# Patient Record
Sex: Female | Born: 1975
Health system: Southern US, Community
[De-identification: ages and names within clinical notes are randomized; demographics above are authoritative.]

## PROBLEM LIST (undated history)

## (undated) DIAGNOSIS — K589 Irritable bowel syndrome without diarrhea: Secondary | ICD-10-CM

## (undated) DIAGNOSIS — R87619 Unspecified abnormal cytological findings in specimens from cervix uteri: Secondary | ICD-10-CM

## (undated) DIAGNOSIS — R51 Headache: Secondary | ICD-10-CM

## (undated) DIAGNOSIS — R892 Abnormal level of other drugs, medicaments and biological substances in specimens from other organs, systems and tissues: Secondary | ICD-10-CM

## (undated) DIAGNOSIS — F3181 Bipolar II disorder: Secondary | ICD-10-CM

## (undated) DIAGNOSIS — Z8619 Personal history of other infectious and parasitic diseases: Secondary | ICD-10-CM

## (undated) DIAGNOSIS — IMO0002 Reserved for concepts with insufficient information to code with codable children: Secondary | ICD-10-CM

## (undated) DIAGNOSIS — R519 Headache, unspecified: Secondary | ICD-10-CM

## (undated) DIAGNOSIS — E039 Hypothyroidism, unspecified: Secondary | ICD-10-CM

## (undated) DIAGNOSIS — Z8701 Personal history of pneumonia (recurrent): Secondary | ICD-10-CM

## (undated) DIAGNOSIS — F410 Panic disorder [episodic paroxysmal anxiety] without agoraphobia: Secondary | ICD-10-CM

## (undated) DIAGNOSIS — Z8742 Personal history of other diseases of the female genital tract: Secondary | ICD-10-CM

## (undated) HISTORY — DX: Abnormal level of other drugs, medicaments and biological substances in specimens from other organs, systems and tissues: R89.2

## (undated) HISTORY — DX: Bipolar II disorder: F31.81

## (undated) HISTORY — DX: Hypothyroidism, unspecified: E03.9

## (undated) HISTORY — DX: Headache, unspecified: R51.9

## (undated) HISTORY — DX: Personal history of pneumonia (recurrent): Z87.01

## (undated) HISTORY — DX: Unspecified abnormal cytological findings in specimens from cervix uteri: R87.619

## (undated) HISTORY — DX: Panic disorder (episodic paroxysmal anxiety): F41.0

## (undated) HISTORY — DX: Personal history of other diseases of the female genital tract: Z87.42

## (undated) HISTORY — DX: Personal history of other infectious and parasitic diseases: Z86.19

## (undated) HISTORY — DX: Headache: R51

## (undated) HISTORY — PX: LAPAROSCOPIC ENDOMETRIOSIS FULGURATION: SUR769

## (undated) HISTORY — PX: KNEE ARTHROSCOPY WITH ANTERIOR CRUCIATE LIGAMENT (ACL) REPAIR: SHX5644

## (undated) HISTORY — PX: ABDOMINAL HYSTERECTOMY: SHX81

## (undated) HISTORY — DX: Reserved for concepts with insufficient information to code with codable children: IMO0002

## (undated) HISTORY — DX: Irritable bowel syndrome, unspecified: K58.9

---

## 1998-07-09 ENCOUNTER — Inpatient Hospital Stay (HOSPITAL_COMMUNITY): Admission: AD | Admit: 1998-07-09 | Discharge: 1998-07-09 | Payer: Self-pay | Admitting: Obstetrics

## 1998-07-26 ENCOUNTER — Inpatient Hospital Stay (HOSPITAL_COMMUNITY): Admission: AD | Admit: 1998-07-26 | Discharge: 1998-07-26 | Payer: Self-pay | Admitting: *Deleted

## 1998-08-22 ENCOUNTER — Inpatient Hospital Stay (HOSPITAL_COMMUNITY): Admission: AD | Admit: 1998-08-22 | Discharge: 1998-08-22 | Payer: Self-pay | Admitting: *Deleted

## 1998-08-22 ENCOUNTER — Encounter: Payer: Self-pay | Admitting: *Deleted

## 2004-04-30 ENCOUNTER — Ambulatory Visit (HOSPITAL_COMMUNITY): Admission: RE | Admit: 2004-04-30 | Discharge: 2004-04-30 | Payer: Self-pay | Admitting: Gynecology

## 2004-04-30 ENCOUNTER — Encounter (INDEPENDENT_AMBULATORY_CARE_PROVIDER_SITE_OTHER): Payer: Self-pay | Admitting: Specialist

## 2004-09-28 ENCOUNTER — Ambulatory Visit: Payer: Self-pay | Admitting: Chiropractic Medicine

## 2005-11-07 ENCOUNTER — Emergency Department: Payer: Self-pay | Admitting: Emergency Medicine

## 2006-10-01 ENCOUNTER — Encounter (INDEPENDENT_AMBULATORY_CARE_PROVIDER_SITE_OTHER): Payer: Self-pay | Admitting: *Deleted

## 2006-10-01 ENCOUNTER — Ambulatory Visit: Payer: Self-pay | Admitting: Obstetrics & Gynecology

## 2007-10-07 ENCOUNTER — Ambulatory Visit: Payer: Self-pay | Admitting: Gynecology

## 2007-10-07 ENCOUNTER — Encounter (INDEPENDENT_AMBULATORY_CARE_PROVIDER_SITE_OTHER): Payer: Self-pay | Admitting: Gynecology

## 2008-02-17 ENCOUNTER — Ambulatory Visit: Payer: Self-pay | Admitting: Gynecology

## 2008-02-26 ENCOUNTER — Ambulatory Visit (HOSPITAL_COMMUNITY): Admission: RE | Admit: 2008-02-26 | Discharge: 2008-02-26 | Payer: Self-pay | Admitting: Gynecology

## 2008-03-08 ENCOUNTER — Ambulatory Visit (HOSPITAL_COMMUNITY): Admission: RE | Admit: 2008-03-08 | Discharge: 2008-03-08 | Payer: Self-pay | Admitting: Gynecology

## 2008-03-08 ENCOUNTER — Ambulatory Visit: Payer: Self-pay | Admitting: Gynecology

## 2008-03-24 ENCOUNTER — Ambulatory Visit: Payer: Self-pay | Admitting: Gynecology

## 2008-03-29 ENCOUNTER — Ambulatory Visit: Payer: Self-pay | Admitting: Gynecology

## 2008-06-28 ENCOUNTER — Ambulatory Visit: Payer: Self-pay | Admitting: Obstetrics and Gynecology

## 2009-01-09 ENCOUNTER — Encounter: Payer: Self-pay | Admitting: Obstetrics & Gynecology

## 2009-01-09 ENCOUNTER — Ambulatory Visit: Payer: Self-pay | Admitting: Obstetrics & Gynecology

## 2009-04-06 ENCOUNTER — Ambulatory Visit: Payer: Self-pay | Admitting: Obstetrics & Gynecology

## 2009-06-01 ENCOUNTER — Ambulatory Visit: Payer: Self-pay | Admitting: Family Medicine

## 2009-07-28 ENCOUNTER — Encounter: Payer: Self-pay | Admitting: Obstetrics & Gynecology

## 2009-08-01 ENCOUNTER — Ambulatory Visit: Payer: Self-pay | Admitting: Obstetrics & Gynecology

## 2009-10-30 ENCOUNTER — Ambulatory Visit: Payer: Self-pay | Admitting: Obstetrics & Gynecology

## 2010-02-04 ENCOUNTER — Ambulatory Visit: Payer: Self-pay | Admitting: Family Medicine

## 2010-02-04 DIAGNOSIS — R319 Hematuria, unspecified: Secondary | ICD-10-CM | POA: Insufficient documentation

## 2010-02-04 LAB — CONVERTED CEMR LAB
Glucose, Urine, Semiquant: 250
Nitrite: POSITIVE
Protein, U semiquant: 100
Specific Gravity, Urine: 1.02
Urobilinogen, UA: >=8
pH: 5

## 2010-02-05 ENCOUNTER — Encounter (INDEPENDENT_AMBULATORY_CARE_PROVIDER_SITE_OTHER): Payer: Self-pay | Admitting: Family Medicine

## 2010-02-07 ENCOUNTER — Encounter: Payer: Self-pay | Admitting: Family Medicine

## 2010-02-07 ENCOUNTER — Ambulatory Visit: Payer: Self-pay | Admitting: Obstetrics and Gynecology

## 2010-02-07 ENCOUNTER — Encounter: Payer: Self-pay | Admitting: Obstetrics & Gynecology

## 2010-02-07 ENCOUNTER — Telehealth (INDEPENDENT_AMBULATORY_CARE_PROVIDER_SITE_OTHER): Payer: Self-pay | Admitting: Family Medicine

## 2010-02-12 ENCOUNTER — Ambulatory Visit: Payer: Self-pay | Admitting: Obstetrics and Gynecology

## 2010-05-08 ENCOUNTER — Ambulatory Visit: Payer: Self-pay | Admitting: Obstetrics & Gynecology

## 2010-07-05 ENCOUNTER — Ambulatory Visit: Payer: Self-pay | Admitting: Obstetrics & Gynecology

## 2010-08-14 NOTE — Progress Notes (Signed)
Summary: Bladder infection       New/Updated Medications: BACTRIM DS 800-160 MG TABS (SULFAMETHOXAZOLE-TRIMETHOPRIM) 1 by mouth two times a day x 7 days Prescriptions: BACTRIM DS 800-160 MG TABS (SULFAMETHOXAZOLE-TRIMETHOPRIM) 1 by mouth two times a day x 7 days  #14 x 0   Entered and Authorized by:   Tacey Ruiz MD   Signed by:   Tacey Ruiz MD on 02/07/2010   Method used:   Electronically to        [facility] (retail)       [address], [address]       [place], [place]  [address]       Ph: [phone]       Fax: [phone]   RxID:   [id number]   The risks, benefits and possible side effects of the treatments and tests were explained clearly to the patient and the patient verbalized understanding.

## 2010-08-14 NOTE — Assessment & Plan Note (Signed)
Summary: KIDNEY/BLADDER INFECTION/JBB   Vital Signs:  Patient Profile:   35 Years Old Female CC:      urinary frequency and pressure O2 Sat:      97 % O2 treatment:    Room Air Pulse rate:   68 / minute Resp:     16 per minute BP sitting:   139 / 96  (right arm)  Pt. in pain?   no  Vitals Entered By: Adella Hare LPN (February 04, 2010 5:16 PM)                   Prior Medication List:  No prior medications documented  Current Allergies (reviewed today): ! PCNHistory of Present Illness Chief Complaint: urinary frequency and pressure History of Present Illness: complains of urinary frequency and pressure Patient states she got sick on Thursday. She reports today more burning and greater pressure.   She states that she has been unable to sleep art night due to the pressure.   Current Problems: HEMATURIA UNSPECIFIED (ICD-599.70) URINARY TRACT INFECTION (ICD-599.0)   Current Meds CIPRO 500 MG TABS (CIPROFLOXACIN HCL) 1 by mouth 2 times daily for the next 5 days PYRIDIUM 200 MG TABS (PHENAZOPYRIDINE HCL) sig 1 po 3x a day next 5 dasys  REVIEW OF SYSTEMS Constitutional Symptoms      Denies fever, chills, and night sweats.  Eyes       Complains of contact lenses.      Denies change in vision, eye pain, and eye discharge. Ear/Nose/Throat/Mouth       Denies hearing loss/aids, change in hearing, and ear pain.  Respiratory       Denies dry cough, productive cough, and wheezing.  Cardiovascular       Denies murmurs and chest pain.    Genitourniary       Complains of painful urination.      Denies blood or discharge from vagina, kidney stones, and loss of urinary control. Neurological       Denies headaches, numbness, and tngling. Musculoskeletal       Denies muscle pain and joint pain.  Skin       Denies bruising.  Psych       Complains of sleep problems.  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 mother living- copd, skin cancer father living- htn 2 brothers living- htn x2  Social History: Reviewed history and no changes required. 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 Pcn  Physical Exam General appearance: well developed, well nourished,mild distress Head: normocephalic, atraumatic Abdomen: soft, non-tender without obvious organomegaly GU: no suprapubic tenderness noted Back: no CVA tendernesss noted Skin: no obvious rashes or lesions MSE: oriented to time, place, and person Assessment New Problems: HEMATURIA UNSPECIFIED (ICD-599.70) URINARY TRACT INFECTION (ICD-599.0)  UTI, hematuria  Patient Education: Patient and/or caregiver instructed in the following: rest fluids and Tylenol.  Plan New Medications/Changes: PYRIDIUM 200 MG TABS (PHENAZOPYRIDINE HCL) sig 1 po 3x a day next 5 dasys  #15 x 0, 02/04/2010, Hassan Rowan MD CIPRO 500 MG TABS (CIPROFLOXACIN HCL) 1 by mouth 2 times daily for the next 5 days  #10 x 0, 02/04/2010, Hassan Rowan MD  New Orders: Urinalysis [[id number]] T-Culture, Urine [[id number]] New Patient Level III [60737] Planning Comments:   discuss bladder hygie ie emptying bladder before relationships  Follow Up: Follow up with Primary Physician  The patient and/or caregiver has been counseled thoroughly  with regard to medications prescribed including dosage, schedule, interactions, rationale for use, and possible side effects and they verbalize understanding.  Diagnoses and expected course of recovery discussed and will return if not improved as expected or if the condition worsens. Patient and/or caregiver verbalized understanding.  Prescriptions: PYRIDIUM 200 MG TABS (PHENAZOPYRIDINE HCL) sig 1 po 3x a day next 5 dasys  #15 x 0   Entered and Authorized by:   Hassan Rowan MD   Signed by:   Hassan Rowan MD on 02/04/2010   Method used:   Print then Give to Patient   RxID:   [id number] CIPRO 500 MG TABS (CIPROFLOXACIN HCL) 1 by mouth 2 times daily for the next 5 days  #10 x 0   Entered and Authorized by:   Hassan Rowan MD   Signed by:   Hassan Rowan MD on 02/04/2010   Method used:   Print then Give to Patient   RxID:   [id number]   Patient Instructions: 1)  Take your antibiotic as prescribed until ALL of it is gone, but stop if you develop a rash or swelling and contact our office as soon as possible. 2)  Please schedule an appointment with your primary doctor in :10-14 days for follow up 3)  Please schedule a follow-up appointment here as needed. 4)  Pyridium can change urine orange  Orders Added: 1)  Urinalysis [[id number]] 2)  T-Culture, Urine [[id number]] 3)  New Patient Level III [99203]  Laboratory Results   Urine Tests  Date/Time Received: February 04, 2010 5:20 PM  Date/Time Reported: February 04, 2010 5:20 PM   Routine Urinalysis   Color: yellow Appearance: Clear Glucose: 250   (Normal Range: Negative) Bilirubin: moderate   (Normal Range: Negative) Ketone: smal (15)   (Normal Range: Negative) Spec. Gravity: 1.020   (Normal Range: 1.003-1.035) Blood: trace-intact   (Normal Range: Negative) pH: 5.0   (Normal Range: 5.0-8.0) Protein: 100   (Normal Range: Negative) Urobilinogen: >=8.0   (Normal Range: 0-1) Nitrite: positive   (Normal Range: Negative) Leukocyte  Esterace: large   (Normal Range: Negative)

## 2010-09-05 ENCOUNTER — Ambulatory Visit: Payer: Self-pay | Admitting: Obstetrics & Gynecology

## 2010-09-05 DIAGNOSIS — N803 Endometriosis of pelvic peritoneum: Secondary | ICD-10-CM

## 2010-09-05 DIAGNOSIS — Z30432 Encounter for removal of intrauterine contraceptive device: Secondary | ICD-10-CM

## 2010-10-03 ENCOUNTER — Ambulatory Visit: Payer: Self-pay | Admitting: Obstetrics & Gynecology

## 2010-11-27 NOTE — Assessment & Plan Note (Signed)
NAMERAY, GLACKEN            ACCOUNT NO.:  [id number]   MEDICAL RECORD NO.:  [id number]          PATIENT TYPE:  POB   LOCATION:  [facility] at [facility]         FACILITY:  [facility]   PHYSICIAN:  Ginger Carne, MD DATE OF BIRTH:  1975-08-03   DATE OF SERVICE:                                  CLINIC NOTE   Ms. Gabrielle Dominguez is here today because of a longstanding history of  recurrent pelvic pain secondary to endometriosis.  The patient has had  laparoscopies performed by another practice from [facility] in 1999 and  2002 confirming said diagnoses.  The records of said surgeries are not  available at the time of this dictation, although request for operative  records have been faxed several times.  The patient has been on a trial  of Depo-Lupron in the past with modest benefit.  She had tried continual  maneuvering and had significant emotional side effects in the past.  The  patient denies GI or GU complaints or musculoskeletal complaints that  would be sources for her pain.  The patient's husband has oligospermia  and the patient conceives by artificial insemination.   OB/GYN HISTORY:  In 2000, the patient had a primary cesarean section and  in 2005, had a first trimester incomplete abortion.   PAST MEDICAL HISTORY:  Noncontributory.   PAST SURGICAL HISTORY:  Diagnostic laparoscopies in 2009 and 2002 and a  primary cesarean section in 2002.   MEDICATIONS:  None.   ALLERGIES:  PENICILLIN.   FAMILY HISTORY:  Mother has had vulvar carcinoma, but otherwise no first-  degree relatives with breast, colon, ovarian or uterine carcinoma.   SOCIAL HISTORY:  The patient is a nonsmoker.  Denies alcohol or illicit  drug abuse.   REVIEW OF SYSTEMS:  A 14 point comprehensive review of systems within  normal limits.   SALIENT PHYSICAL FINDINGS:  VITAL SIGNS:  Blood pressure 113/72, weight  193 pounds, height 5 feet 4-1/2 inches.  HEENT:  Grossly normal.  BREAST:  Without masses,  discharge, thickenings, or tenderness.  CHEST:  Clear to percussion and auscultation.  CARDIOVASCULAR:  Without murmurs or enlargements.  Regular rate and  rhythm.  EXTREMITY, LYMPHATIC, SKIN, NEUROLOGICAL, MUSCULOSKELETAL SYSTEMS:  Within normal limits.  ABDOMEN:  Soft without gross hepatosplenomegaly.  PELVIC:  Normal Pap smear in March 2009.  GC and chlamydia cultures were  obtained today.  EXTERNAL GENITALIA:  Vulva and vagina normal.  Cervix smooth without  erosions or lesions and nontender.  Right adnexa is tender, but no  masses palpated.  Left adnexa is without tenderness or masses.   IMPRESSION:  Chronic pelvic pain with history of endometriosis.   PLAN:  1. Pelvic ultrasound to evaluate presence of an ovarian endometrioma.  2. Operative laparoscopy to assess status of pelvis related to pelvic      pain and stage of endometriosis.  The patient desires another child      and wants to be informed by the artificial insemination would be of      benefit at this time.           ______________________________  Ginger Carne, MD     SHB/MEDQ  D:  02/17/2008  T:  02/18/2008  Job:  [id number]

## 2010-11-27 NOTE — Assessment & Plan Note (Signed)
[name], [name]            ACCOUNT NO.:  [id number]   MEDICAL RECORD NO.:  [id number]          PATIENT TYPE:  POB   LOCATION:  [facility]         FACILITY:  [facility]   PHYSICIAN:  Ginger Carne, MD DATE OF BIRTH:  1975/12/23   DATE OF SERVICE:  03/24/2008                                  CLINIC NOTE   This patient is a 35 year old Caucasian female who is status post  laparoscopic fulguration of endometriosis for stage II disease performed  on March 08, 2008.  In the past, the patient has had similar treatment  at [facility] and was placed on a 19-month course of Depo-Lupron  11.25 mg q.3 months.  The patient has had significant pelvic pain all  the time and also has a desire to have another child.  She does use  intrauterine insemination techniques as her husband is oligospermic.  The plan of treatment is to proceed with another 32-month course of Depo-  Lupron 11.25 mg q.3 months x2.  Reassess the patient every 3 months and  decide in 6 months whether to continue or allow her to try to conceive.  The last time she underwent this regimen, she was able to conceive  within several months of stopping Depo-Lupron.           ______________________________  Ginger Carne, MD     SHB/MEDQ  D:  03/24/2008  T:  03/24/2008  Job:  [id number]

## 2010-11-27 NOTE — Assessment & Plan Note (Signed)
Gabrielle Dominguez, Gabrielle Dominguez            ACCOUNT NO.:  [id number]   MEDICAL RECORD NO.:  [id number]          PATIENT TYPE:  POB   LOCATION:  [facility]         [facility]ACILITY:  [facility]   PHYSICIAN:  Catalina Antigua, MD     DATE OF BIRTH:  May 03, 1976   DATE OF SERVICE:  02/07/2010                                  CLINIC NOTE   This is a 35 year old G2, P1-0-1-1 who presents today for annual exam.  The patient has a longstanding history of endometriosis status post 2  laparoscopic surgeries for endometriosis and is currently being  medically managed with Depo Lupron.  The patient is currently without  any complaints.  Denies any abnormal bleeding or discharge or pelvic  pain.  The patient is happy with her current management.   PAST MEDICAL HISTORY:  Significant for endometriosis.   PAST SURGICAL HISTORY:  She has had laparoscopic operative laparoscopy  for endometriosis in 2003 and 2009.  She also had a C-section.   PAST OBSTETRICAL HISTORY:  She has had 1 C-section.   PAST GYNECOLOGIC HISTORY:  She denies any history of abnormal Pap smear  screening fibroids or STDs.   FAMILY HISTORY:  Significant with vulvar cancer in her mother, otherwise  negative for other GYN malignancies.   SOCIAL HISTORY:  She denies drinking, smoking, and the use of illicit  drugs.   She reports a PENICILLIN allergy.   Her medications include calcium and vitamin D supplements occasionally.   REVIEW OF SYSTEMS:  Otherwise within normal limits.   PHYSICAL EXAMINATION:  VITAL SIGNS:  Her blood pressure is 122/80, pulse  of 79, weight of 205 pounds.  LUNGS:  Clear to auscultation bilaterally.  HEART:  Regular rate and rhythm.  BREASTS:  Nontender, equal in size.  No expressible nipple discharge.  No skin dimpling.  No palpable masses or lymphadenopathy.  ABDOMEN:  Soft, nontender, nondistended, obese.  EXTREMITIES:  Nontender, equal in size.  PELVIC:  Normal external genitalia, normal-appearing vaginal  mucosa and  cervix.  No abnormal bleeding or discharge.  Bimanual exam, she had a  small uterus.  No palpable adnexal masses or tenderness.   ASSESSMENT AND PLAN:  This is a 35 year old G2, P1 who has been on Depo  Lupron management of her endometriosis for a year.  She is here for  annual exam.  Pap smear was performed.  The patient was counseled  regarding the duration of her Depo Lupron treatment and that further  management should be contemplated such as definitive treatment with TAH-  BSO.  The patient is not interested in surgery at this point.  The risk  of bone loss with long-term use of Depo Lupron were reviewed with the  patient.  The patient was encouraged to take daily supplements of  calcium with vitamin D and the patient was encouraged to contemplate  further treatment of her endometriosis as she cannot continue to take  Depo Lupron for the next 20 years of her life.  The patient will be  contacted with any abnormal results.  The patient is to return in 3  month for Depo Lupron injection.  Of note, the patient was treated with  Cipro from a  [facility], but is complaining of still experiencing  symptoms for a UTI.  Urine culture was sent today.  The patient will be  contacted with any abnormal results and sensitivities and a new  antibiotic prescription will be called in if necessary.           ______________________________  Catalina Antigua, MD     PC/MEDQ  D:  02/07/2010  T:  02/08/2010  Job:  [id number]

## 2010-11-27 NOTE — Op Note (Signed)
[name], Gabrielle Dominguez            ACCOUNT NO.:  [id number]   MEDICAL RECORD NO.:  [id number]          PATIENT TYPE:  AMB   LOCATION:  [facility]                           FACILITY:  [facility]   PHYSICIAN:  Ginger Carne, MD  DATE OF BIRTH:  09-29-1975   DATE OF PROCEDURE:  03/08/2008  DATE OF DISCHARGE:                               OPERATIVE REPORT   PREOPERATIVE DIAGNOSES:  Chronic pelvic pain, history of endometriosis.   POSTOPERATIVE DIAGNOSES:  Chronic pelvic pain, history of endometriosis,  stage II endometriosis.   PROCEDURE:  Laparoscopic fulguration of pelvic endometriosis.   SURGEON:  Ginger Carne, MD   ASSISTANT:  None.   COMPLICATIONS:  None immediate.   ESTIMATED BLOOD LOSS:  Minimal.   SPECIMEN:  None.   OPERATIVE FINDINGS:  External genitalia, vulva, and vagina normal.  Cervix smooth without erosions or lesions.  Laparoscopic evaluation  revealed stage II endometriosis with blue and red punctate lesions  across the broad ligaments bilaterally.  The cul-de-sac with uneven  distribution.  There was no adhesive disease of the adnexa; however,  there was surface endometriosis on the ovaries bilaterally as well as on  the right cornu.  No evidence of femoral, inguinal, or obturator  hernias.  Upper abdomen appeared normal.  Large and small bowel appeared  normal.   OPERATIVE PROCEDURE:  The patient prepped and draped in usual fashion  and placed in lithotomy position.  Betadine solution used for antiseptic  and the patient was catheterized prior to the procedure.  Safety time-  out was performed satisfactorily and the patient was placed in stirrups  in a neurologically safe position.  Afterwards, a tenaculum placed on  the anterior lip of the cervix and a Hickman tenaculum placed on the  endocervical canal.  Afterwards, a vertical infraumbilical incision was  made.  The Veress needle placed in the abdomen.  Opening closing  pressures were 10-15 mmHg.  Needle  released, trocar placed in same  incision, laparoscope placed in the trocar sleeve.  Two 5 mm ports were  made in left lower quadrant, left hypogastric regions.  Inspection of  the pelvic and upper abdominal contents were carried out.  Photography  taken.  This was then followed by fulguration of endometriosis noted  bilaterally in the broad ligaments.  Care and attention was paid to  avoid areas of the pelvic ureters.  Several small omental anterior wall  adhesions were also taken down.  No active bleeding noted again in the  procedure, gas released, trocars removed.  Closure of the 10-mm fascia  site with 0 Vicryl suture and 4-0 Vicryl for subcuticular closure.  Instruments, sponge counts were correct.  The patient tolerated  procedure well, returned to post anesthesia recovery room in excellent  condition.      Ginger Carne, MD  Electronically Signed     SHB/MEDQ  D:  03/08/2008  T:  03/09/2008  Job:  [id number]

## 2010-11-27 NOTE — Assessment & Plan Note (Signed)
[name], [name]            ACCOUNT NO.:  [id number]   MEDICAL RECORD NO.:  [id number]          PATIENT TYPE:  POB   LOCATION:  [facility]         FACILITY:  [facility]   PHYSICIAN:  Allie Bossier, MD        DATE OF BIRTH:  Feb 19, 1976   DATE OF SERVICE:  05/08/2010                                  CLINIC NOTE   Gabrielle Dominguez is a 35 year old lady with endometriosis diagnosed via laparoscopy  by Dr. Mia Creek in 2009.  She says that the Depo Lupron is not helping  her pain and she would like to try the Mirena IUD.  Urine pregnancy test  was negative.  Placed in dorsal lithotomy position.  Speculum was  placed.  Cervix was prepped with iodine and anterior lip of the cervix  was grasped with single-tooth tenaculum.  Using a uterine sound,  attempted to sound in the uterus, however.  The uterine sound was not  progressing past 2 cm, so I placed a tenaculum on the posterior lip of  the cervix and the uterine sound progressed to 7 cm.  The Mirena IUD was  placed without complications.  The string was cut to 3 cm and the  tenaculum was removed.  No bleeding was noted from the site.  She will  follow up in 4 weeks for a string check or sooner as necessary.      Allie Bossier, MD     MCD/MEDQ  D:  05/08/2010  T:  05/08/2010  Job:  [id number]

## 2010-11-27 NOTE — Assessment & Plan Note (Signed)
Gabrielle Dominguez, Gabrielle Dominguez            ACCOUNT NO.:  [id number]   MEDICAL RECORD NO.:  [id number]          PATIENT TYPE:  POB   LOCATION:  [facility]         FACILITY:  [facility]   PHYSICIAN:  Jaynie Collins, MD     DATE OF BIRTH:  [birth year]   DATE OF SERVICE:  01/09/2009                                  CLINIC NOTE   CHIEF COMPLAINT:  Yearly examination with Pap smear.   HISTORY OF PRESENT ILLNESS:  The patient is a 35 year old gravida 2,  para 1-0-1-1 with a long-standing history of endometriosis who is here  for her annual examination.  The patient's endometriosis has been  treated with Depo-Lupron in the past with modest benefit.  Her last  Lupron dose was in December 2009 and she just got a period this June.  During her period, she did have severe pain, but she is not willing to  go back on the Lupron as she and her husband are trying to have another  baby.  Of note, the patient's husband has oligospermia and the patient  conceives by artificial insemination.  She is scheduled to undergo  another artificial insemination trial in a month or at [facility].  Apart from  the pain during her period, the patient has no other concerns.   PAST OB/GYN HISTORY:  The patient's has had 1 cesarean section and a  first trimester miscarriage.  The patient has had normal Pap smears.  Denies any history of cervical dysplasia.   PAST MEDICAL HISTORY:  Endometriosis and obesity.   PAST SURGICAL HISTORY:  Diagnostic laparoscopy for endometriosis in 2002  and 2009.  Primary cesarean section in 2002 for cephalopelvic  disproportion and the fistula scar, which had endometriosis in it.   MEDICATIONS:  None.   ALLERGIES:  PENICILLIN.   SOCIAL HISTORY:  The patient denies any habits.   FAMILY HISTORY:  Mother has vulvar cancer, otherwise no first-degree  relatives with breast, colon or any other gynecologic cancer.   REVIEW OF SYSTEMS:  A 14-point comprehensive review of system is within  normal  limits.   PHYSICAL EXAMINATION:  VITAL SIGNS:  Pulse 74, blood pressure 122/88,  weight 104 pounds, height 54.5 inches.  GENERAL:  No apparent distress.  HEENT:  Normocephalic and atraumatic.  NECK:  Supple.  No masses.  Normal thyroid.  BREASTS:  No masses, discharge, abnormal skin changes or  lymphadenopathy.  Symmetric in size.  LUNGS:  Clear to auscultation bilaterally.  HEART:  Regular rate and rhythm.  ABDOMEN:  Soft, nontender, and nondistended.  EXTREMITIES:  No clubbing, cyanosis or edema.  Nontender.  PELVIC:  Normal external female genitalia.  Pink well rugated vagina.  Cervix is smooth without any lesions and nontender.  Nontender adnexa or  uterus.  Uterus is normal in size.  Pap smear was obtained.   ASSESSMENT AND PLAN:  The patient is a 35 year old gravida 2, para 1-0-1-  1 with a history of endometriosis here for her annual exam.  The patient  had a normal breast examination and a Pap smear was obtained.  We will  follow up results.  As for endometriosis, the patient is declining any  management at this point.  She does say that if she does not succeed  with this trial of artifical insemination, she will be interested in  definitive management, possibly in the form of a total hysterectomy and  bilateral salpingo-oophorectomy, as she does not want to undergo any  further debilitating pain.  The patient was told to call us if and when  she makes those decision and if not we will see her in 1 year for her  annual examination.           ______________________________  Jaynie Collins, MD     UA/MEDQ  D:  01/09/2009  T:  01/10/2009  Job:  [id number]

## 2010-11-30 NOTE — Op Note (Signed)
Gabrielle Dominguez, Gabrielle Dominguez            ACCOUNT NO.:  [id number]   MEDICAL RECORD NO.:  [id number]          PATIENT TYPE:  AMB   LOCATION:  [facility]                           FACILITY:  [facility]   PHYSICIAN:  Ginger Carne, MD  DATE OF BIRTH:  Aug 08, 1975   DATE OF PROCEDURE:  04/30/2004  DATE OF DISCHARGE:                                 OPERATIVE REPORT   PREOPERATIVE DIAGNOSIS:  First trimester incomplete abortion.   POSTOPERATIVE DIAGNOSIS:  First trimester incomplete abortion.   PROCEDURE:  Aspiration, dilatation, and curettage.   SURGEON:  Ginger Carne, M.D.   ASSISTANT:  None.   COMPLICATIONS:  None immediate.   ANESTHESIA:  General, MAC.   ESTIMATED BLOOD LOSS:  Negligible.   SPECIMENS:  Products of conception.   COMPLICATIONS:  None immediate.   OPERATIVE FINDINGS:  External genitalia, vulva, and vaginal normal.  Cervix  smooth without erosions or lesions.  Cervix was open with clots present,  with products of conception at the os.  The patient is Rh positive.  The  uterus was about 17 cm on sounding.  Both adnexa were palpable, found to be  normal.  Uterine cavity was smooth.   OPERATIVE PROCEDURE:  The patient prepped and draped in the usual fashion  and placed in the lithotomy position, Betadine solution used for antiseptic.  The patient was not catheterized and Sensorcaine with epinephrine utilized  as a paracervical block.  A #7 suction curette was utilized for suction  curettage.  At the end of the procedure, minimal bleeding noted and the  cavity was clean.  Specimen to pathology.  The patient returned to the  postanesthesia recovery room in excellent condition.     Stev   SHB/MEDQ  D:  04/30/2004  T:  04/30/2004  Job:  [id number]

## 2011-01-25 NOTE — Assessment & Plan Note (Signed)
Gabrielle Dominguez, Gabrielle Dominguez            ACCOUNT NO.:  [id number]  MEDICAL RECORD NO.:  [id number]          PATIENT TYPE:  POB  LOCATION:  [facility]         FACILITY:  [facility]  PHYSICIAN:  Tinnie Gens, MD        DATE OF BIRTH:  01/04/1976  DATE OF SERVICE:  09/05/2010                                 CLINIC NOTE  CHIEF COMPLAINT:  IUD removal.  HISTORY OF PRESENT ILLNESS:  The patient is a 35 year old, gravida 2, para 1-0-1-1, who has a long history of endometriosis.  She had previously been managed on Depo-Lupron which seemed to work well for her.  However, she had changed over to Mirena IUD insertion.  She has had that in since October of last year and has reported increasing moodiness, emotional lability, pain, weight gain.  She has gone back on her Celexa that has not really helped.  She is also spotting for the last several days and she really wants her IUD removed.  She is presently engaged and is not interested in definitive surgery to include hysterectomy.  She is concerned about bone loss being on Depo-Lupron for so long and I am too.  She is not really interested in the birth control pills at this time.  She has undergone laparoscopy x2 with fulguration of the implants x2.  PHYSICAL EXAMINATION:  VITALS:  Are as noted in the chart.  Weight is 223 which is up approximately 30 pounds from July of last year. GENERAL:  She is a well-developed, well-nourished female, in no acute distress.  PROCEDURE:  Speculum is placed on the vagina and cervix is visualized. IUD string is grasped with a straight Tresa Endo and removed without difficulty.  IMPRESSION: 1. Long standing endometriosis. 2. Pelvic pain. 3. Status post intrauterine (contraceptive) device removal.  PLAN:  We will start the patient on norethindrone acetate 5 mg.  She may increase weekly to a max of 15 mg.  We will follow up 4-6 weeks to see how this is doing to control her pain.  Additional options  include gabapentin which was discussed with the patient today which may or may not also require some bit of taper.  In addition of oral contraceptives including a continuous variety to see if this helps to control her pain. I have addressed pregnancy timing if that pregnancy, risk of infertility with endometriosis and possible need for definitive therapy in the future if the patient understands all this.          ______________________________ Tinnie Gens, MD    TP/MEDQ  D:  09/05/2010  T:  09/06/2010  Job:  [id number]

## 2011-03-06 ENCOUNTER — Telehealth: Payer: Self-pay

## 2011-03-06 NOTE — Telephone Encounter (Signed)
This patient wants to speak to you about the depo shot and also needs a refill on her 800

## 2011-03-07 ENCOUNTER — Telehealth: Payer: Self-pay | Admitting: *Deleted

## 2011-03-07 MED ORDER — IBUPROFEN 800 MG PO TABS: 800.0000 mg | ORAL_TABLET | Freq: Three times a day (TID) | ORAL | Status: DC | PRN

## 2011-03-07 MED ORDER — IBUPROFEN 800 MG PO TABS: 800.0000 mg | ORAL_TABLET | Freq: Three times a day (TID) | ORAL | Status: AC | PRN

## 2011-03-07 NOTE — Telephone Encounter (Signed)
Patient wishes to have a prescription called in for 800mg  Motrin.  She has no additional fills and uses for her endometriosis pain.  She is also interested in going back on Lupron-Depot for her Endometriosis.  She is not working right now, so we will check into the assistance program for her.  She also received information that her Thyroid might be underactive.  She was advised to have that rechecked at her next visit with [place] or a primary care physician.

## 2011-03-25 ENCOUNTER — Encounter: Payer: Self-pay | Admitting: Gynecology

## 2011-03-26 ENCOUNTER — Ambulatory Visit (INDEPENDENT_AMBULATORY_CARE_PROVIDER_SITE_OTHER): Payer: Managed Care, Other (non HMO) | Admitting: Family Medicine

## 2011-03-26 ENCOUNTER — Encounter: Payer: Self-pay | Admitting: Family Medicine

## 2011-03-26 ENCOUNTER — Other Ambulatory Visit (HOSPITAL_COMMUNITY)
Admission: RE | Admit: 2011-03-26 | Discharge: 2011-03-26 | Disposition: A | Discharge status: Home / Self Care | Payer: Self-pay | Source: Ambulatory Visit | Attending: Family Medicine | Admitting: Family Medicine

## 2011-03-26 DIAGNOSIS — Z1272 Encounter for screening for malignant neoplasm of vagina: Secondary | ICD-10-CM

## 2011-03-26 DIAGNOSIS — N809 Endometriosis, unspecified: Secondary | ICD-10-CM

## 2011-03-26 DIAGNOSIS — Z8742 Personal history of other diseases of the female genital tract: Secondary | ICD-10-CM | POA: Insufficient documentation

## 2011-03-26 DIAGNOSIS — Z01419 Encounter for gynecological examination (general) (routine) without abnormal findings: Secondary | ICD-10-CM | POA: Insufficient documentation

## 2011-03-26 NOTE — Patient Instructions (Addendum)
Endometriosis Cramps, Pain and Infertility Up to ten per cent of women in child bearing years have endometriosis. Endometriosis is a disease that occurs when the endometrium (lining of the uterus) is misplaced outside of its normal location. It may occur in many locations close to the uterus (womb), but commonly on the ovaries, fallopian tubes, vagina (birth canal) and bowel located close to the uterus. Because the uterus sloughs (expels) its lining every month (menses), there is bleeding where ever the endometrial tissue is located. SYMPTOMS Often there are no symptoms (problems); however because blood is irritating to tissues not normally exposed to it, when symptoms occur they vary with the location of the misplaced endometrium. Symptoms often include back and abdominal (belly) pain. Periods may be heavier and intercourse may be painful. Infertility may be present. You may have all of these symptoms at one time or another or you may have months with no symptoms at all. Although the symptoms occur mainly during menses, they can occur mid-cycle as well, and usually terminate with menopause. DIAGNOSIS You will have to be seen by your caregiver for a diagnosis (learning what is wrong). Your caregiver may recommend a blood test and urine test (urinalysis) to help rule out other conditions. Another common test is ultrasound, a painless procedure that uses sound waves to make a sonogram "picture" of abnormal tissue that could be endometriosis. If your bowel movements are painful around your periods, your caregiver may advise a barium enema (an x-ray of the lower bowel), to try to find the source of your pain. This is sometimes confirmed by laparoscopy. Laparoscopy is a procedure where your caregiver looks into your abdomen with a laparoscope (a small pencil sized telescope). Your caregiver may take a tiny piece of tissue (biopsy) from any abnormal tissue to confirm or document your problem. These tissues are sent  to the lab and a pathologist looks at them under the microscope to give a microscopic diagnoses. TREATMENT Once the diagnosis is made, it can be treated by destruction of the misplaced endometrial tissue using heat (diathermy), laser, cutting (excision), or chemical means. It may also be treated with hormonal therapy. When using hormonal therapy menses are eliminated, therefore eliminating the monthly exposure to blood by the misplaced endometrial tissue. Only in severe cases is it necessary to perform a hysterectomy with removal of the tubes, uterus and ovaries. HOME CARE INSTRUCTIONS  Only take over-the-counter or prescription medicines for pain, discomfort, or fever as directed by your caregiver.   Avoid activities that produce pain, including a physical sexual relationship.   Do not take aspirin as this may increase bleeding when not on hormonal therapy.   See your caregiver for pain or problems not controlled with treatment.  SEEK IMMEDIATE MEDICAL CARE IF:  Your pain is severe and is not responding to pain medication.   You develop severe nausea and vomiting or can't keep foods down.   Your pain localizes to the right lower part of your abdomen (possible appendicitis).   There is swelling or increasing pain in the abdomen.   An unexplained oral temperature above 102 F (38.9 C) develops, or as your caregiver suggests.   You see blood in your stool.  MAKE SURE YOU:   Understand these instructions.   Will watch your condition.   Will get help right away if you are not doing well or get worse.  Document Released: 06/28/2000 Document Re-Released: 06/13/2008 [facility] Patient Information 2011 [place], [place].Preventative Care for Adults - Female Studies show  that half of deaths in the [place] today result from unhealthy lifestyle practices. This includes ignoring preventive care suggestions. Preventive health guidelines for women include the following key practices:  A  routine yearly physical is a good way to check with your primary caregiver about your health and preventive screening. It is a chance to share any concerns and updates on your health, and to receive a thorough all-over exam.   If you smoke cigarettes, find out from your caregiver how to quit. It can literally save your life, no matter how long you have been a tobacco user. If you do not use tobacco, never start.   Maintain a healthy diet and normal weight. Increased weight leads to problems with blood pressure and diabetes. Decrease saturated fat in your diet and increase regular exercise. Eat a variety of foods, including fruit, vegetables, animal or vegetable protein (meat, fish, chicken, and eggs, or beans, lentils, and tofu), and grains, such as rice. Get information about proper diet from your caregiver, if needed.   Aerobic exercise helps maintain good heart health. The CDC and the [employer] of Sports Medicine recommend 30 minutes of moderate-intensity exercise (a brisk walk that increases your heart rate and breathing) on most days of the week. Ongoing high blood pressure should be treated with medicines, if weight loss and exercise are not effective.   Avoid smoking, drinking too much alcohol (more than two drinks per day), and use of street drugs. Do not share needles with anyone. Ask for professional help if you need support or instructions about stopping the use of alcohol, cigarettes, or drugs.   Maintain normal blood lipids and cholesterol, by minimizing your intake of saturated fat. Eat a well rounded diet, with plenty of fruit and vegetables. The [employer] of Health encourage women to eat 5-9 servings of fruit and vegetables each day. Your caregiver can give instructions to help you keep your risk of heart disease or stroke low. High blood pressure causes heart disease and increases risk of stroke. Blood pressure should be checked every 1-2 years, from age 28 onward.    Blood tests for high cholesterol, which causes heart and vessel disease, should begin at age 72 and be repeated every 5 years, if test results are normal. (Repeat tests more often if results are high.)   Diabetes screening involves taking a blood sample to check your blood sugar level, after a fasting period. This is done once every 3 years, after age 79, if test results are normal.   Breast cancer screening is essential to preventive care for women. All women age 68 and older should perform a breast self-exam every month. At age 52 and older, women should have their caregiver complete a breast exam each year. Women at ages 71-50 should have a mammogram (x-ray film) of the breasts each year. Your caregiver can discuss when to start your yearly mammograms.   Cervical cancer screening includes taking a Pap smear (sample of cells examined under a microscope) from the cervix (end of the uterus). It also includes testing for HPV (Human Papilloma Virus, which can cause cervical cancer). Screening and a pelvic exam should begin at age 22, or 3 years after a woman becomes sexually active. Screening should occur every year, with a Pap smear but no HPV testing, up to age 31. After age 74, you should have a Pap smear every 3 years with HPV testing, if no HPV was found previously.   Colon cancer can be detected,  and often prevented, long before it is life threatening. Most routine colon cancer screening begins at the age of 54. On a yearly basis, your caregiver may provide easy-to-use take-home tests to check for hidden blood in the stool. Use of a small camera at the end of a tube, to directly examine the colon (sigmoidoscopy or colonoscopy), can detect the earliest forms of colon cancer and can be life saving. Talk to your caregiver about this at age 36, when routine screening begins. (Screening is repeated every 5 years, unless early forms of pre-cancerous polyps or small growths are found.)   Practice safe  sex. Use condoms. Condoms are used for birth control and to reduce the spread of sexually transmitted infections (STIs). Unsafe sex is sexual activity without the use of safeguards, such as condoms and avoidance of high-risk acts, to reduce the chances of getting or spreading STIs. STIs include gonorrhea (the clap), chlamydia, syphilis, trichimonas, herpes, HPV (human papilloma virus) and HIV (human immunodeficiency virus), which causes AIDS. Herpes, HIV, and HPV are viral illnesses that have no cure. They can result in disability, cancer, and death.   HPV causes cancer of the cervix, and other infections that can be transmitted from person to person. There is a vaccine for HPV, and females should get immunized between the ages of 20 and 66. It requires a series of 3 shots.   Osteoporosis is a disease in which the bones lose minerals and strength as we age. This can result in serious bone fractures. Risk of osteoporosis can be identified using a bone density scan. Women ages 39 and over should discuss this with their caregivers, as should women after menopause who have other risk factors. Ask your caregiver whether you should be taking a calcium supplement and Vitamin D, to reduce the rate of osteoporosis.   Menopause can be associated with physical symptoms and risks. Hormone replacement therapy is available to decrease these. You should talk to your caregiver about whether starting or continuing to take hormones is right for you.   Use sunscreen with SPF (skin protection factor) of 15 or more. Apply sunscreen liberally and repeatedly throughout the day. Being outside in the sun, when your shadow is shorter than you are, means you are being exposed to sun at greater intensity. Lighter skinned people are at a greater risk of skin cancer. Wear sunglasses, to protect your eyes from too much damaging sunlight (which can speed up cataract formation).   Once a month, do a whole body skin exam or review, using a  mirror to look at your back. Notify your caregiver of changes in moles, especially if there are changes in shapes, colors, irregular border, a size larger than a pencil eraser, or new moles develop.   Keep carbon monoxide and smoke detectors in your home, and functioning, at all times. Change the batteries every 6 months, or use a model that plugs into the wall.   Stay up to date with your tetanus shots and other required immunizations. A booster for tetanus should be given every 10 years. Be sure to get your flu shot every year, since 5%-20% of the [place] population comes down with the flu. The composition of the flu vaccine changes each year, so being vaccinated once is not enough. Get your shot in the fall, before the flu season peaks. The table below lists important vaccines to get. Other vaccines to consider include for Hepatitis A virus (to prevent a form of infection of the liver, by  a virus acquired from food), Varicella Zoster (a virus that causes shingles), and Meninogoccal (against bacteria which cause a form of meningitis).   Brush your teeth twice a day with fluoride toothpaste, and floss once a day. Good oral hygiene prevents tooth decay and gum disease, which can be painful and can cause other health problems. Visit your dentist for a routine oral and dental check up and preventive care every 6-12 months.   The Body Mass Index or BMI is a way of measuring how much of your body is fat. Having a BMI above 27 increases the risk of heart disease, diabetes, hypertension, stroke and other problems related to obesity. Your caregiver can help determine your BMI, and can develop an exercise and dietary program to help you achieve or maintain this measurement at a healthy level.   Wear seat belts whenever you are in a vehicle, whether as passenger or driver, and even for short drives of a few minutes.   If you bicycle, wear a helmet at all times.  Preventative Care for Adult Women  Preventative  Services Ages 80-39 Ages 67-64 Ages 76 and over  Health risk assessment and lifestyle counseling.     Blood pressure check.** Every 1-2 years Every 1-2 years Every 1-2 years  Total cholesterol check including HDL.** Every 5 years beginning at age 40 Every 5 years beginning at age 52, or more often if risk is high Every 5 years through age 23, then optional  Breast self exam. Monthly in all women ages 70 and older Monthly Monthly  Clinical breast exam.** Every 3 years beginning at age 17 Every year Every year  Mammogram.**  Every year beginning at age 44, optional from age 65-49 (discuss with your caregiver). Every year until age 60, then optional  Pap Smear** and HPV Screening. Every year from ages 35 through 80 Every 3 years from ages 60 through 31, if HPV is negative Optional; talk with your caregiver  Flexible sigmoidoscopy** or colonoscopy.**   Every 5 years beginning at age 78 Every 5 years until age 58; then optional  FOBT (fecal occult blood test) of stool.  Every year beginning at age 48 Every year until 43; then optional  Skin self-exam. Monthly Monthly Monthly  Tetanus-diphtheria (Td) immunization. Every 10 years Every 10 years Every 10 years  Influenza immunization.** Every year Every year Every year  HPV immunization. Once between the ages of 70 and 52     Pneumococcal immunization.** Optional Optional Every 5 years  Hepatitis B immunization.** Series of 3 immunizations  (if not done previously, usually given at 0, 1 to 2, and 4 to 6 months)  Check with your caregiver, if vaccination not previously given Check with your caregiver, if vaccination not previously given  ** Family history and personal history of risk and conditions may change your caregiver's recommendations.  Document Released: 08/27/2001 Document Re-Released: 09/25/2009 [facility] Patient Information 2011 [place], [place].Preparing for Pregnancy Preparing for pregnancy (preconceptual care) by getting counseling and  information from your caregiver before getting pregnant is a good idea. It will help you and your baby have a better chance to have a healthy, safe pregnancy and delivery of your baby. Make an appointment with your caregiver to talk about your health, medical and family history and how to prepare yourself before getting pregnant. Your caregiver will do a complete physical exam and a Pap test. They will want to know:  About you, your spouse/partner and your family's medical and genetic history.  If you are eating a balanced diet and drinking enough fluids.   What vitamins and mineral supplements you are taking. This includes taking folic acid before getting pregnant to help prevent birth defects.   What medications you are taking including prescription, over-the-counter and herbal medications.   If there is any substance abuse like alcohol, smoking and illegal drugs.   If there is any mental or physical domestic violence.   If there is any risk of sexually transmitted disease between you and your partner.   What immunizations and vaccinations you have had and what you may need before getting pregnant.   If you should get tested for HIV infection.   If there is any exposure to chemical or toxic substances at home or work.   If there are medical problems you have that need to be treated and kept under control before getting pregnant such as diabetes, high blood pressure or others.   If there were any past surgeries, pregnancies and problems with them.   What your current weight is and to set a goal as to how much weight you should gain while pregnant. Also, they will check if you should lose or gain weight before getting pregnant.   What is your exercise routine and what it is safe when you are pregnant.   If there are any physical disabilities that need to be addressed.   About spacing your pregnancies when there are other children.   If there is a financial problem that may affect  you having a child.  After talking about the above points with your caregiver, your caregiver will give you advice on how to help treat and work with you on solving any issues, if necessary, before getting pregnant. The goal is to have a healthy and safe pregnancy for you and your baby. You should keep an accurate record of your menstrual periods because it will help in determining your due date. Immunizations that you should have before getting pregnant:   Regular measles, [place] measles (rubella) and mumps.  Tetanus and diphtheria.   Chicken pox, if not immune.   Herpes zoster (Varicella) if not immune.  Human papilloma virus vaccine (HPV) between the age of 59 and 98 years old).   Hepatitis A vaccine.  Hepatitis B vaccine.   Influenza vaccine.   Pneumococcal vaccine (pneumonia).   You should avoid getting pregnant for one month after getting vaccinated with a live virus vaccine such as [place] measles (rubella) vaccine. Other immunizations may be necessary depending on where you live, such as malaria. Ask your caregiver if any other immunizations are needed for you. HOME CARE INSTRUCTIONS  Follow the advice of your caregiver.   Before getting pregnant:   Begin taking vitamins, supplements and folic acid 0.4 milligrams/day.   Get your immunizations up to date.   Get help from a nutrition counselor if you do not understand what a balanced diet is, need help with a special medical diet or if you need help to lose or gain weight.   Begin exercising.   Stop smoking, taking illegal drugs and drinking alcoholic beverages.   Get counseling if there is and type of domestic violence.   Get checked for sexually transmitted diseases including HIV.   Get any medical problems under control (diabetes, high blood pressure, convulsions, asthma or others).   Resolve any financial concerns.   Be sure you and your spouse/partner are ready to have a baby.   Keep an accurate record of your  menstrual periods.  Document Released: 06/13/2008  [facility] Patient Information 2011 [place], [place].

## 2011-03-26 NOTE — Progress Notes (Signed)
  Subjective:     Gabrielle Dominguez is a 35 y.o. female and is here for a comprehensive physical exam. The patient reports problems - pelvic pain related to endometriosis.  Increasing weight gain.  Has painful, regular cycles. Desires pregnancy or second  Depo Lupron trial. History   Social History  . Marital Status: Married    Spouse Name: N/A    Number of Children: N/A  . Years of Education: N/A   Occupational History  . Not on file.   Social History Main Topics  . Smoking status: Former Games developer  . Smokeless tobacco: Not on file  . Alcohol Use: No  . Drug Use: No  . Sexually Active: Yes -- Female partner(s)    Birth Control/ Protection: None   Other Topics Concern  . Not on file   Social History Narrative  . No narrative on file   Health Maintenance  Topic Date Due  . Pap Smear  03/30/1993  . Tetanus/tdap  03/30/1994    The following portions of the patient's history were reviewed and updated as appropriate: allergies, current medications, past family history, past medical history, past social history, past surgical history and problem list.  Review of Systems A comprehensive review of systems was negative.   Objective:    BP 124/82  Pulse 67  Ht 5\' 4"  (1.626 m)  Wt 215 lb (97.523 kg)  BMI 36.90 kg/m2  LMP 03/11/2011 General appearance: alert, cooperative and appears stated age Neck: no adenopathy, supple, symmetrical, trachea midline and thyroid not enlarged, symmetric, no tenderness/mass/nodules Lungs: clear to auscultation bilaterally Breasts: normal appearance, no masses or tenderness Heart: regular rate and rhythm, S1, S2 normal, no murmur, click, rub or gallop Abdomen: soft, non-tender; bowel sounds normal; no masses,  no organomegaly Pelvic: cervix normal in appearance, external genitalia normal, no adnexal masses or tenderness, no cervical motion tenderness, uterus normal size, shape, and consistency and vagina normal without discharge Extremities:  lymphedema in left foot Pulses: 2+ and symmetric Skin: Skin color, texture, turgor normal. No rashes or lesions Lymph nodes: Cervical, supraclavicular, and axillary nodes normal. Neurologic: Grossly normal    Assessment:    Healthy female exam. Endometriosis     Plan:    Pap smear TSH Considering pregnancy vs. DepoLupron, May need REI referral--timed intercourse discussed. See After Visit Summary for Counseling Recommendations

## 2011-03-28 ENCOUNTER — Telehealth: Payer: Self-pay | Admitting: Gynecology

## 2011-03-28 NOTE — Telephone Encounter (Signed)
Lab result was given to patient. Recommend patient follow up with PCP. I call the [place] office and schedule an appt. For patient on 04/03/11. Patient aware of appt.

## 2011-04-03 ENCOUNTER — Ambulatory Visit: Payer: Self-pay | Admitting: Family Medicine

## 2011-04-03 DIAGNOSIS — Z0289 Encounter for other administrative examinations: Secondary | ICD-10-CM

## 2011-04-05 ENCOUNTER — Ambulatory Visit (INDEPENDENT_AMBULATORY_CARE_PROVIDER_SITE_OTHER): Payer: Managed Care, Other (non HMO) | Admitting: Family Medicine

## 2011-04-05 ENCOUNTER — Telehealth: Payer: Self-pay | Admitting: Family Medicine

## 2011-04-05 ENCOUNTER — Encounter: Payer: Self-pay | Admitting: Family Medicine

## 2011-04-05 DIAGNOSIS — R946 Abnormal results of thyroid function studies: Secondary | ICD-10-CM

## 2011-04-05 DIAGNOSIS — Z0000 Encounter for general adult medical examination without abnormal findings: Secondary | ICD-10-CM

## 2011-04-05 DIAGNOSIS — E039 Hypothyroidism, unspecified: Secondary | ICD-10-CM

## 2011-04-05 DIAGNOSIS — Z8742 Personal history of other diseases of the female genital tract: Secondary | ICD-10-CM

## 2011-04-05 DIAGNOSIS — Z23 Encounter for immunization: Secondary | ICD-10-CM

## 2011-04-05 LAB — T3, FREE: T3, Free: 2.7 pg/mL (ref 2.3–4.2)

## 2011-04-05 MED ORDER — LEVOTHYROXINE SODIUM 50 MCG PO TABS: 50.0000 ug | ORAL_TABLET | Freq: Every day | ORAL | Status: DC

## 2011-04-05 MED ORDER — LEVOTHYROXINE SODIUM 112 MCG PO TABS: 112.0000 ug | ORAL_TABLET | Freq: Every day | ORAL | Status: DC

## 2011-04-05 MED ORDER — LEVOTHYROXINE SODIUM 125 MCG PO TABS: 125.0000 ug | ORAL_TABLET | Freq: Every day | ORAL | Status: DC

## 2011-04-05 NOTE — Progress Notes (Signed)
Addended by: Josph Macho A on: 04/05/2011 10:54 AM   Modules accepted: Orders

## 2011-04-05 NOTE — Progress Notes (Addendum)
Subjective:    Patient ID: Gabrielle Dominguez, female    DOB: 29-Jun-1976, 35 y.o.   MRN: [id number]  HPI CC: new pt establish  Referred by GYN for abnl TSH, recently experiencing more depression, irritability, breaking out on face, breaking into sweats, fatigue, gaining weight.  Went to weight loss clinic, told had abnormal thyroid.  When had pap smear, had thyroid checked and abnormal again.  These sxs have been going on since January 2012.  Even walking up steps makes her exhausted, some SOB.  Has gained ~30lbs in last year. Feels short term memory off.  + constipation, dry brittle hair.  No family history of thyroid dysfunction.  Drinks water, rarely sodas.  Tries to eat healthy but cheats at times.    Preventative: Well woman with [facility]. Thinks had labwork done in past. Unsure last tetanus, thinks within 5 years. LMP -- late august 2012.  Pap 03/2011  Medications and allergies reviewed and updated in chart.  Past histories reviewed and updated if relevant as below. Patient Active Problem List  Diagnoses  . HEMATURIA UNSPECIFIED  . Endometriosis   Past Medical History  Diagnosis Date  . History of endometriosis   . Abnormal Pap smear   . Depression   . Worsening headaches   . Hypothyroid   . History of chicken pox    Past Surgical History  Procedure Date  . Laparoscopic endometriosis fulguration 2002 and 2009  . Cesarean section 2000   History  Substance Use Topics  . Smoking status: Former Smoker    Quit date: 07/15/2002  . Smokeless tobacco: Never Used  . Alcohol Use: No   Family History  Problem Relation Age of Onset  . Heart attack Paternal Grandfather   . Hypertension Paternal Grandfather   . Hypertension Maternal Grandmother   . Hypertension Father   . Cancer Father     melanoma  . Cancer Mother     vulvar  . Hypertension Mother   . Stroke Mother   . Cancer Maternal Aunt 40    colon cancer  . Hypertension Brother   . Stroke Maternal  Grandfather   . Coronary artery disease Neg Hx   . Diabetes Neg Hx   . Cancer Cousin     melanoma   Allergies  Allergen Reactions  . Penicillins    No current outpatient prescriptions on file prior to visit.   Review of Systems  Constitutional: Positive for appetite change (decrease) and unexpected weight change. Negative for fever, chills, activity change and fatigue.  HENT: Negative for hearing loss and neck pain.   Eyes: Negative for visual disturbance.  Respiratory: Positive for shortness of breath. Negative for cough, chest tightness and wheezing.   Cardiovascular: Positive for leg swelling (L foot). Negative for chest pain and palpitations.  Gastrointestinal: Positive for nausea. Negative for vomiting, abdominal pain, diarrhea, constipation, blood in stool and abdominal distention.  Genitourinary: Negative for hematuria and difficulty urinating.  Musculoskeletal: Negative for myalgias, back pain, joint swelling and arthralgias.  Skin: Negative for rash.  Neurological: Positive for light-headedness and headaches. Negative for dizziness, seizures and syncope.  Hematological: Negative for adenopathy. Does not bruise/bleed easily.  Psychiatric/Behavioral: Positive for dysphoric mood. The patient is nervous/anxious.    Per HPI    Objective:   Physical Exam  Nursing note and vitals reviewed. Constitutional: She is oriented to person, place, and time. She appears well-developed and well-nourished. No distress.  HENT:  Head: Normocephalic and atraumatic.  Right Ear:  External ear normal.  Left Ear: External ear normal.  Nose: Nose normal.  Mouth/Throat: Oropharynx is clear and moist. No oropharyngeal exudate.  Eyes: Conjunctivae and EOM are normal. Pupils are equal, round, and reactive to light.  Neck: Normal range of motion. Neck supple. No tracheal deviation present. No thyromegaly present.  Cardiovascular: Normal rate, regular rhythm, normal heart sounds and intact distal  pulses.   No murmur heard. Pulses:      Radial pulses are 2+ on the right side, and 2+ on the left side.  Pulmonary/Chest: Effort normal and breath sounds normal. No respiratory distress. She has no wheezes. She has no rales.  Abdominal: Soft. Bowel sounds are normal. She exhibits no distension and no mass. There is no tenderness. There is no rebound and no guarding.  Musculoskeletal: Normal range of motion. She exhibits no edema.  Lymphadenopathy:    She has no cervical adenopathy.  Neurological: She is alert and oriented to person, place, and time.       CN grossly intact, station and gait intact  Skin: Skin is warm and dry. No rash noted.  Psychiatric: She has a normal mood and affect. Her behavior is normal. Judgment and thought content normal.          Assessment & Plan:

## 2011-04-05 NOTE — Telephone Encounter (Signed)
Spoke with patient and notified her. She did confirm LMP was 03-15-11

## 2011-04-05 NOTE — Telephone Encounter (Signed)
Patient notified and had already picked up the original Rx. She will take 1/2 of a pill and follow up as scheduled.   She was questioning why 2 previous results would read abnormal and then this one would come back fine. I told her I would ask and get back to her. Can you advise?

## 2011-04-05 NOTE — Assessment & Plan Note (Signed)
Symptoms consistent with hypothyroid, last TSH abnormal at 5.875.  Check free T3, T4 today. Discussed brand vs generic. Pt would like to start with brand. Start synthroid daily. Return in 5 wks for blood work, afterwards for OV to discuss. Return fasting when returns for blood work to check other routine blood work

## 2011-04-05 NOTE — Telephone Encounter (Addendum)
Please notify thyroid function returned overall normal this visit. i'd still like to treat her with thyroid medicine, but at lower dose that what was sent in. If hasn't picked up script, I will send in daily of synthroid.  If has picked up, I'd like her to start at 1/2 pill daily. Id still like her to return as previously discussed for blood work then for office visit.

## 2011-04-05 NOTE — Telephone Encounter (Addendum)
TSH fluctuates throughout the day. This value was in "normal range" but still a bit high for her age. She likely has subclinical hypothyroidism, but given sxs she endorsed today, I will want to treat her to see if we can get improvement. Also please verify LMP - i think it was 03/15/2011?

## 2011-04-05 NOTE — Patient Instructions (Addendum)
Blood work today to check on thyroid. Start synthroid daily. Return in 5 weeks for blood work again (fasting) and in 6 weeks for follow up visit (physical and go over labs). Call [facility] to check about last tetanus shot.

## 2011-04-17 ENCOUNTER — Emergency Department: Payer: Self-pay | Admitting: Emergency Medicine

## 2011-05-10 ENCOUNTER — Other Ambulatory Visit (INDEPENDENT_AMBULATORY_CARE_PROVIDER_SITE_OTHER): Payer: 59

## 2011-05-10 DIAGNOSIS — R946 Abnormal results of thyroid function studies: Secondary | ICD-10-CM

## 2011-05-10 DIAGNOSIS — Z0000 Encounter for general adult medical examination without abnormal findings: Secondary | ICD-10-CM

## 2011-05-10 LAB — COMPREHENSIVE METABOLIC PANEL
ALT: 18 U/L (ref 0–35)
CO2: 26 mEq/L (ref 19–32)
Calcium: 8.6 mg/dL (ref 8.4–10.5)
Chloride: 108 mEq/L (ref 96–112)
GFR: 100.57 mL/min (ref >60.00)
Sodium: 140 mEq/L (ref 135–145)
Total Bilirubin: 0.5 mg/dL (ref 0.3–1.2)
Total Protein: 6.9 g/dL (ref 6.0–8.3)

## 2011-05-10 LAB — CBC WITH DIFFERENTIAL/PLATELET
Basophils Absolute: 0 10*3/uL (ref 0.0–0.1)
Eosinophils Absolute: 0.1 10*3/uL (ref 0.0–0.7)
Hemoglobin: 14.5 g/dL (ref 12.0–15.0)
Lymphocytes Relative: 33.6 % (ref 12.0–46.0)
MCHC: 34.6 g/dL (ref 30.0–36.0)
Monocytes Absolute: 0.4 10*3/uL (ref 0.1–1.0)
Neutro Abs: 2.5 10*3/uL (ref 1.4–7.7)
RDW: 12.3 % (ref 11.5–14.6)

## 2011-05-10 LAB — LIPID PANEL: Total CHOL/HDL Ratio: 3

## 2011-05-17 ENCOUNTER — Ambulatory Visit: Payer: Managed Care, Other (non HMO) | Admitting: Family Medicine

## 2011-05-17 ENCOUNTER — Encounter: Payer: Self-pay | Admitting: Family Medicine

## 2011-05-17 ENCOUNTER — Ambulatory Visit (INDEPENDENT_AMBULATORY_CARE_PROVIDER_SITE_OTHER): Payer: 59 | Admitting: Family Medicine

## 2011-05-17 ENCOUNTER — Ambulatory Visit: Payer: Self-pay | Admitting: Family Medicine

## 2011-05-17 VITALS — BP 112/78 | HR 80 | Temp 98.3°F | Wt 214.1 lb

## 2011-05-17 DIAGNOSIS — E039 Hypothyroidism, unspecified: Secondary | ICD-10-CM

## 2011-05-17 MED ORDER — LEVOTHYROXINE SODIUM 100 MCG PO TABS: 100.0000 ug | ORAL_TABLET | Freq: Every day | ORAL | Status: DC

## 2011-05-17 NOTE — Progress Notes (Signed)
  Subjective:    Patient ID: Gabrielle Dominguez, female    DOB: [birth year], 35 y.o.   MRN: [id number]  HPI CC: f/u thyroid abnomality  Week after seen her last, started having deep cough with fever.  Went to ER and found to have PNA with fluid in L lung, placed on abx (levaquin) and tessalon perls which didn't help.  Starting to improve, still with mild cough.  Had PNA back in 07/2010 as well.  No smokers at home.  No more fevers/chills, productive cough.  Fatigue has improved since last visit, and starting thyroid medication.  Somnolence/sleep improved as well as some depression sxs improved.  No h/o radiation to thyroid, no xrays.  No family history of thyroid issues.  Started going back to bariatric clinic.  Trying to eat healthier.  Asks about phentermine  Wt Readings from Last 3 Encounters:  05/17/11 214 lb 1.9 oz (97.124 kg)  04/05/11 215 lb 12 oz (97.864 kg)  03/26/11 215 lb (97.523 kg)   Review of Systems Per HPI    Objective:   Physical Exam  Nursing note and vitals reviewed. Constitutional: She appears well-developed and well-nourished. No distress.  HENT:  Head: Normocephalic and atraumatic.  Mouth/Throat: Oropharynx is clear and moist. No oropharyngeal exudate.  Eyes: Conjunctivae and EOM are normal. Pupils are equal, round, and reactive to light. No scleral icterus.  Neck: Normal range of motion. Neck supple. No thyromegaly present.       No thyroid masses or nodules  Cardiovascular: Normal rate, regular rhythm, normal heart sounds and intact distal pulses.   No murmur heard. Pulmonary/Chest: Effort normal and breath sounds normal. No respiratory distress. She has no wheezes. She has no rales.  Skin: Skin is warm and dry. No rash noted.  Psychiatric: She has a normal mood and affect.      Assessment & Plan:

## 2011-05-17 NOTE — Assessment & Plan Note (Addendum)
When checked TSH/ free T3,T4, consistent with subclinical hypothyroidism (TSH 3.6 a bit high given age), also endorsed several hypothyroidism sxs. Clinically consistent with hypothyroidism. Did start synthroid , however had wanted to back down to 1/2 pill daily when subclinical picture returned on labwork.  Pt did not, TSH on recheck after 4 wks 0.41, a bit low. Will change to synthroid daily, recheck in 6 wks. pt endorses significant improvement with sxs.  Continue treatment. Discussed avoiding phentermine at least until good control of thyroid, stable synthroid dose.

## 2011-05-17 NOTE — Patient Instructions (Addendum)
Things are looking good today. I have dropped synthroid to daily. Return in 6 weeks for recheck (labwork). Good to see you today! Call us with questions.

## 2011-06-28 ENCOUNTER — Other Ambulatory Visit: Payer: 59

## 2011-07-01 ENCOUNTER — Telehealth: Payer: Self-pay | Admitting: Family Medicine

## 2011-07-01 ENCOUNTER — Other Ambulatory Visit: Payer: 59

## 2011-07-01 ENCOUNTER — Other Ambulatory Visit (INDEPENDENT_AMBULATORY_CARE_PROVIDER_SITE_OTHER): Payer: 59

## 2011-07-01 DIAGNOSIS — E039 Hypothyroidism, unspecified: Secondary | ICD-10-CM

## 2011-07-01 NOTE — Telephone Encounter (Signed)
Has she been taking new dose of synthroid for last 6 wks, or still 112 mcg daily? If new dose of , still a bit elevated thyroid function so would like to back her down to daily and have her return in 2 mo for recheck labs. Let me know so I can send in new dose and place lab orders

## 2011-07-02 NOTE — Telephone Encounter (Signed)
Spoke with patient. She has been taking the 100 mcg. She said ever since it was changed, she has been feeling very sluggish. She thought it was due to the decrease in meds, but if her thyroid is still elevated, now she isn't sure. She was asking if she should still decrease the med due to how she was feeling. I advised that her labs showed that she did need to decrease med, but I would double check with you.  She also said she has been having panic attacks where her chest has been tight and she has been on the verge of hyperventilating. She said she has an extreme amount of stress right now and is having some trouble coping and not breaking down. She was wondering if there was anything you could give her to help with this.

## 2011-07-02 NOTE — Telephone Encounter (Signed)
Message left for patient to return my call.  

## 2011-07-02 NOTE — Telephone Encounter (Signed)
Patient notified. Appt scheduled to discuss anxiety.

## 2011-07-02 NOTE — Telephone Encounter (Signed)
I do want her to drop dose to . Would need to come in to discuss these episodes.

## 2011-07-04 ENCOUNTER — Ambulatory Visit: Payer: 59 | Admitting: Family Medicine

## 2011-07-04 DIAGNOSIS — Z0289 Encounter for other administrative examinations: Secondary | ICD-10-CM

## 2011-07-23 ENCOUNTER — Other Ambulatory Visit: Payer: Self-pay | Admitting: *Deleted

## 2011-07-23 MED ORDER — LEVOTHYROXINE SODIUM 88 MCG PO TABS: 88.0000 ug | ORAL_TABLET | Freq: Every day | ORAL | Status: DC

## 2011-07-23 NOTE — Telephone Encounter (Signed)
Pharmacy did not receive new Rx for smaller dose. Sent in as directed.

## 2011-08-12 ENCOUNTER — Ambulatory Visit: Payer: 59 | Admitting: Obstetrics & Gynecology

## 2011-08-13 ENCOUNTER — Ambulatory Visit (INDEPENDENT_AMBULATORY_CARE_PROVIDER_SITE_OTHER): Payer: Self-pay | Admitting: Obstetrics & Gynecology

## 2011-08-13 VITALS — BP 112/71 | HR 73 | Ht 64.0 in | Wt 219.0 lb

## 2011-08-13 DIAGNOSIS — Z8742 Personal history of other diseases of the female genital tract: Secondary | ICD-10-CM

## 2011-08-13 DIAGNOSIS — N800 Endometriosis of uterus: Secondary | ICD-10-CM

## 2011-08-13 MED ORDER — LEUPROLIDE ACETATE (3 MONTH) 22.5 MG IM KIT: 22.5000 mg | PACK | INTRAMUSCULAR | Status: DC

## 2011-08-13 MED ORDER — LEUPROLIDE ACETATE (3 MONTH) 11.25 MG IM KIT: 11.2500 mg | PACK | INTRAMUSCULAR | Status: DC

## 2011-08-13 MED ORDER — NORETHINDRONE ACETATE 5 MG PO TABS: 5.0000 mg | ORAL_TABLET | Freq: Every day | ORAL | Status: DC

## 2011-08-13 NOTE — Progress Notes (Signed)
History:  36 y.o. G2P1011 here today for management of endometriosis, diagnosed on laparoscopy in 2009.  She has been on Lupron in the past, and wants to restart this medication.  She is concerned about bone loss and increased risk of osteoporosis.  No other concerns.  The following portions of the patient's history were reviewed and updated as appropriate: allergies, current medications, past family history, past medical history, past social history, past surgical history and problem list. Last pap in 9/21012 was normal.   Objective:  Physical Exam Blood pressure 112/71, pulse 73, height 5\' 4"  (1.626 m), weight 99.338 kg (219 lb), last menstrual period 08/02/2011. Deferred  Assessment & Plan:  No contraindication to Depo Lupron.  Patient to apply to get medication for company as she does not have insurance.  If she qualifies, will obtain the medication and administer it here in clinic. Also prescribed Norethindrone 5 mg po daily for addback therapy to help with side effects of Lupron and help prevent bone loss.  Follow up as needed.

## 2011-08-26 ENCOUNTER — Other Ambulatory Visit (INDEPENDENT_AMBULATORY_CARE_PROVIDER_SITE_OTHER): Payer: Self-pay

## 2011-08-26 DIAGNOSIS — N809 Endometriosis, unspecified: Secondary | ICD-10-CM

## 2011-08-26 MED ORDER — LEUPROLIDE ACETATE (3 MONTH) 11.25 MG IM KIT
11.2500 mg | PACK | Freq: Once | INTRAMUSCULAR | Status: AC
  Administered 2011-08-26: 11.25 mg via INTRAMUSCULAR

## 2011-08-26 NOTE — Progress Notes (Signed)
Patient is here for Lupron Depot injection.

## 2011-10-10 ENCOUNTER — Ambulatory Visit (INDEPENDENT_AMBULATORY_CARE_PROVIDER_SITE_OTHER): Payer: Self-pay | Admitting: Family Medicine

## 2011-10-10 ENCOUNTER — Encounter: Payer: Self-pay | Admitting: Family Medicine

## 2011-10-10 VITALS — BP 122/84 | HR 72 | Temp 98.0°F | Wt 223.0 lb

## 2011-10-10 DIAGNOSIS — E039 Hypothyroidism, unspecified: Secondary | ICD-10-CM

## 2011-10-10 DIAGNOSIS — F410 Panic disorder [episodic paroxysmal anxiety] without agoraphobia: Secondary | ICD-10-CM | POA: Insufficient documentation

## 2011-10-10 DIAGNOSIS — F341 Dysthymic disorder: Secondary | ICD-10-CM

## 2011-10-10 DIAGNOSIS — F419 Anxiety disorder, unspecified: Secondary | ICD-10-CM

## 2011-10-10 DIAGNOSIS — E0789 Other specified disorders of thyroid: Secondary | ICD-10-CM

## 2011-10-10 LAB — TSH: TSH: 0.79 u[IU]/mL (ref 0.35–5.50)

## 2011-10-10 MED ORDER — ALPRAZOLAM 0.25 MG PO TABS: 0.1250 mg | ORAL_TABLET | Freq: Two times a day (BID) | ORAL | Status: AC | PRN

## 2011-10-10 NOTE — Progress Notes (Signed)
Subjective:    Patient ID: Gabrielle Dominguez, female    DOB: May 07, 1976, 36 y.o.   MRN: [id number]  HPI CC: f/u thyroid, issues  For last month having abd pain in morning 30 min after taking thyroid medicine, then feels like has lump in throat.  Noticing loose stools quickly after meals (every meal).  Doesn't feel like needs to clear throat.  No fevers/chills, viral sxs, skin or hair changes.  + cold intolerance.  Worsening anxiety for last several months.  Hyperventilates when feels overwhelmed, trouble breathing.  This has happened several times even when in public.  Denies recent change in stressors.  Dealing with change in job - this stress has been going on for last 6-8 mo.  Trouble sleeping at night - excessive worrying.  More irritable with son recently.  + anhedonia.  Energy level terrible.  Concentration terrible.  Appetite ok.  Having anxiety over leaving house 2/2 concern for having another anxiety attack.  Attacks occuring 2-3x/wk.  No SI/HI.  To relieve stress - glass of wine.  Takes hot bath, prays, spends time alone.  Lives with mom and son - mom recently d/c from hospital after 1 wk, now on permanent O2.  Stressful.  On lupron and depo provera for endometriosis.  Wt Readings from Last 3 Encounters:  10/10/11 223 lb (101.152 kg)  08/13/11 219 lb (99.338 kg)  05/17/11 214 lb 1.9 oz (97.124 kg)   Medications and allergies reviewed and updated in chart.  Past histories reviewed and updated if relevant as below. Patient Active Problem List  Diagnoses  . HEMATURIA UNSPECIFIED  . History of endometriosis  . Hypothyroid   Past Medical History  Diagnosis Date  . History of endometriosis   . Abnormal Pap smear   . Depression   . Worsening headaches   . Hypothyroid   . History of chicken pox   . History of pneumonia 07/2010, 04/2011   Past Surgical History  Procedure Date  . Laparoscopic endometriosis fulguration 2002 and 2009  . Cesarean section 2000   History    Substance Use Topics  . Smoking status: Former Smoker    Quit date: 07/15/2002  . Smokeless tobacco: Never Used  . Alcohol Use: No   Family History  Problem Relation Age of Onset  . Heart attack Paternal Grandfather   . Hypertension Paternal Grandfather   . Hypertension Maternal Grandmother   . Hypertension Father   . Cancer Father     melanoma  . Cancer Mother     vulvar  . Hypertension Mother   . Stroke Mother   . Cancer Maternal Aunt 40    colon cancer  . Hypertension Brother   . Stroke Maternal Grandfather   . Coronary artery disease Neg Hx   . Diabetes Neg Hx   . Cancer Cousin     melanoma   Allergies  Allergen Reactions  . Penicillins    Current Outpatient Prescriptions on File Prior to Visit  Medication Sig Dispense Refill  . levothyroxine (SYNTHROID, LEVOTHROID) 88 MCG tablet Take 1 tablet (88 mcg total) by mouth daily.  30 tablet  3      Review of Systems Per HPI    Objective:   Physical Exam  Nursing note and vitals reviewed. Constitutional: Gabrielle Dominguez appears well-developed and well-nourished. No distress.  HENT:  Head: Normocephalic and atraumatic.  Mouth/Throat: Oropharynx is clear and moist. No oropharyngeal exudate.  Neck: Normal range of motion. Neck supple. Thyromegaly (possible fullness R lobe, tender  to palpation) present.  Cardiovascular: Normal rate, regular rhythm, normal heart sounds and intact distal pulses.   No murmur heard. Pulmonary/Chest: Effort normal and breath sounds normal. No respiratory distress. Gabrielle Dominguez has no wheezes. Gabrielle Dominguez has no rales.  Lymphadenopathy:    Gabrielle Dominguez has no cervical adenopathy.  Skin: Skin is warm and dry. No rash noted.  Psychiatric: Her behavior is normal. Judgment and thought content normal.       Easily cries over discussion of anxiety       Assessment & Plan:

## 2011-10-10 NOTE — Assessment & Plan Note (Signed)
Recheck TSH/free T4 but I anticipate stable levels on levothyroxine daily.

## 2011-10-10 NOTE — Assessment & Plan Note (Signed)
For last 6-8 months, recently worsening over last 2-3 months. Describes anxiety attacks as well as I believe component of GAD and depression. Discussed pharmacotherapy as well as psychotherapy. Pt interested in both but currently not interested in daily medication for this. Discussed better long term strategy of SSRI, will start with PRN benzo to take for anxiety attacks, will refer to Dr. Laymond Purser for further assistance with Anxiety and depression. rtc 1 mo PHQ9= 9/27, very difficult to function GAD7= 15/21

## 2011-10-10 NOTE — Patient Instructions (Addendum)
Good to see you today, call us with questions. Pass by [facility] office for referral to Dr. Laymond Purser and thyroid ultrasound. Return in 1 month for f/u. Blood work today.

## 2011-10-10 NOTE — Assessment & Plan Note (Signed)
Check thyroid US given tender to palpation R thyroid lobe as well as lump in throat sensation to eval goiter.

## 2011-10-14 ENCOUNTER — Ambulatory Visit
Admission: RE | Admit: 2011-10-14 | Discharge: 2011-10-14 | Disposition: A | Discharge status: Home / Self Care | Payer: No Typology Code available for payment source | Source: Ambulatory Visit | Attending: Family Medicine | Admitting: Family Medicine

## 2011-10-14 DIAGNOSIS — E0789 Other specified disorders of thyroid: Secondary | ICD-10-CM

## 2011-11-06 ENCOUNTER — Ambulatory Visit: Payer: Self-pay | Admitting: Psychology

## 2011-11-09 ENCOUNTER — Other Ambulatory Visit: Payer: Self-pay | Admitting: Family Medicine

## 2011-11-11 ENCOUNTER — Ambulatory Visit: Payer: Self-pay | Admitting: Family Medicine

## 2011-11-11 DIAGNOSIS — Z0289 Encounter for other administrative examinations: Secondary | ICD-10-CM

## 2011-11-15 ENCOUNTER — Ambulatory Visit: Payer: 59 | Admitting: Family Medicine

## 2011-11-15 DIAGNOSIS — Z0289 Encounter for other administrative examinations: Secondary | ICD-10-CM

## 2011-11-27 ENCOUNTER — Other Ambulatory Visit: Payer: Self-pay | Admitting: *Deleted

## 2011-11-27 MED ORDER — LEUPROLIDE ACETATE (3 MONTH) 11.25 MG IM KIT
11.2500 mg | PACK | INTRAMUSCULAR | Status: DC
  Administered 2011-11-27 – 2013-12-13 (×2): 11.25 mg via INTRAMUSCULAR

## 2011-11-27 MED ORDER — IBUPROFEN 800 MG PO TABS: 800.0000 mg | ORAL_TABLET | Freq: Three times a day (TID) | ORAL | Status: AC | PRN

## 2011-11-27 NOTE — Progress Notes (Signed)
Patient is here today for her Lupron Depot injection.  She is doing well and the injection is helping a lot with her pelvic pain.  She would like a refill of her ibuprofen to help with some breakthrough cramping that she has.

## 2012-05-22 ENCOUNTER — Ambulatory Visit (INDEPENDENT_AMBULATORY_CARE_PROVIDER_SITE_OTHER): Payer: Self-pay | Admitting: *Deleted

## 2012-05-22 DIAGNOSIS — N809 Endometriosis, unspecified: Secondary | ICD-10-CM

## 2012-05-22 MED ORDER — LEUPROLIDE ACETATE (3 MONTH) 11.25 MG IM KIT
11.2500 mg | PACK | Freq: Once | INTRAMUSCULAR | Status: AC
  Administered 2012-05-22: 11.25 mg via INTRAMUSCULAR

## 2012-08-20 ENCOUNTER — Ambulatory Visit (INDEPENDENT_AMBULATORY_CARE_PROVIDER_SITE_OTHER): Payer: PRIVATE HEALTH INSURANCE | Admitting: Family Medicine

## 2012-08-20 ENCOUNTER — Encounter: Payer: Self-pay | Admitting: Family Medicine

## 2012-08-20 VITALS — BP 120/72 | HR 88 | Temp 98.1°F | Ht 64.0 in | Wt 228.2 lb

## 2012-08-20 DIAGNOSIS — E669 Obesity, unspecified: Secondary | ICD-10-CM

## 2012-08-20 DIAGNOSIS — E039 Hypothyroidism, unspecified: Secondary | ICD-10-CM

## 2012-08-20 DIAGNOSIS — E66811 Obesity, class 1: Secondary | ICD-10-CM | POA: Insufficient documentation

## 2012-08-20 DIAGNOSIS — E6609 Other obesity due to excess calories: Secondary | ICD-10-CM | POA: Insufficient documentation

## 2012-08-20 NOTE — Assessment & Plan Note (Signed)
Has implemented healthy diet/lifestyle changes but has not noted weight loss she would like Body mass index is 39.18 kg/(m^2).  Check TSH today.

## 2012-08-20 NOTE — Patient Instructions (Signed)
Good to see you today. Thyroid check today.  We will call you with results to see if any change in dose

## 2012-08-20 NOTE — Assessment & Plan Note (Addendum)
Check TSH today.  Will update with level and titrate med accordingly. [place] with heterogeneous thyroid gland last year.

## 2012-08-20 NOTE — Progress Notes (Signed)
  Subjective:    Patient ID: Gabrielle Dominguez, female    DOB: June 27, 1976, 37 y.o.   MRN: [id number]  HPI CC: recheck thyroid  Wt Readings from Last 3 Encounters:  08/20/12 228 lb 4 oz (103.534 kg)  10/10/11 223 lb (101.152 kg)  08/13/11 219 lb (99.338 kg)    Obesity: Eating very healthy, watching calories, no sodas, just water, exercising 1-2x/day (1-2 hours).  Regardless has only last 2 lbs.  On depo for the last year.  Hypothyroid - compliant with levothyroxine daily.  Denies fevers/chills, diarrhea, constipation, skin or hair changes.  Occasional heat and cold intolerance.  No leg swelling.  Lab Results  Component Value Date   TSH 0.79 10/10/2011    Past Medical History  Diagnosis Date  . History of endometriosis   . Abnormal Pap smear   . Depression with anxiety   . Worsening headaches   . Hypothyroid   . History of chicken pox   . History of pneumonia 07/2010, 04/2011    Past Surgical History  Procedure Date  . Laparoscopic endometriosis fulguration 2002 and 2009  . Cesarean section 2000   Review of Systems Per HPI    Objective:   Physical Exam  Nursing note and vitals reviewed. Constitutional: She appears well-developed and well-nourished. No distress.       Obese CF  HENT:  Head: Normocephalic and atraumatic.  Mouth/Throat: Oropharynx is clear and moist. No oropharyngeal exudate.  Eyes: Conjunctivae normal and EOM are normal. Pupils are equal, round, and reactive to light. No scleral icterus.  Neck: Normal range of motion. Neck supple. No thyromegaly present.  Cardiovascular: Normal rate, regular rhythm, normal heart sounds and intact distal pulses.   No murmur heard. Pulmonary/Chest: Effort normal and breath sounds normal. No respiratory distress. She has no wheezes. She has no rales.  Skin: Skin is warm and dry.  Psychiatric: She has a normal mood and affect.       Assessment & Plan:

## 2012-08-23 ENCOUNTER — Other Ambulatory Visit: Payer: Self-pay | Admitting: Family Medicine

## 2012-08-23 DIAGNOSIS — E039 Hypothyroidism, unspecified: Secondary | ICD-10-CM

## 2012-08-23 MED ORDER — LEVOTHYROXINE SODIUM 50 MCG PO TABS: 50.0000 ug | ORAL_TABLET | Freq: Every day | ORAL | Status: DC

## 2012-08-24 ENCOUNTER — Other Ambulatory Visit: Payer: Self-pay | Admitting: *Deleted

## 2012-08-25 ENCOUNTER — Telehealth: Payer: Self-pay | Admitting: *Deleted

## 2012-08-25 MED ORDER — LEVOTHYROXINE SODIUM 88 MCG PO TABS: 88.0000 ug | ORAL_TABLET | Freq: Every day | ORAL | Status: DC

## 2012-08-25 NOTE — Telephone Encounter (Signed)
Please have her take daily - sent in new dose. Return in 6 wks for recheck.

## 2012-08-25 NOTE — Telephone Encounter (Signed)
Patient called this AM and realized last night that she had actually been taking 112 mcg of levothyroxine instead of the 88 mcg that she thought she was taking, which would be why her labs came back as being over treated. Should she still go ahead and take the 50 mcg you sent in for her or should she take a different dosage?

## 2012-08-25 NOTE — Telephone Encounter (Signed)
Patient notified and will keep previously scheduled lab appt.

## 2012-09-05 ENCOUNTER — Ambulatory Visit: Payer: PRIVATE HEALTH INSURANCE | Admitting: Family Medicine

## 2012-09-05 DIAGNOSIS — Z0289 Encounter for other administrative examinations: Secondary | ICD-10-CM

## 2012-09-07 ENCOUNTER — Ambulatory Visit (INDEPENDENT_AMBULATORY_CARE_PROVIDER_SITE_OTHER): Payer: PRIVATE HEALTH INSURANCE | Admitting: Family Medicine

## 2012-09-07 ENCOUNTER — Encounter: Payer: Self-pay | Admitting: Gastroenterology

## 2012-09-07 ENCOUNTER — Encounter: Payer: Self-pay | Admitting: *Deleted

## 2012-09-07 ENCOUNTER — Encounter: Payer: Self-pay | Admitting: Family Medicine

## 2012-09-07 VITALS — BP 126/84 | HR 84 | Temp 98.0°F | Wt 228.8 lb

## 2012-09-07 DIAGNOSIS — K921 Melena: Secondary | ICD-10-CM

## 2012-09-07 LAB — CBC WITH DIFFERENTIAL/PLATELET
Basophils Relative: 0.8 % (ref 0.0–3.0)
Eosinophils Absolute: 0.1 10*3/uL (ref 0.0–0.7)
Eosinophils Relative: 2.1 % (ref 0.0–5.0)
HCT: 44.2 % (ref 36.0–46.0)
Lymphs Abs: 2.1 10*3/uL (ref 0.7–4.0)
MCHC: 35.1 g/dL (ref 30.0–36.0)
MCV: 92.7 fl (ref 78.0–100.0)
Monocytes Absolute: 0.7 10*3/uL (ref 0.1–1.0)
Neutro Abs: 4.3 10*3/uL (ref 1.4–7.7)
Neutrophils Relative %: 59.4 % (ref 43.0–77.0)
RBC: 4.76 Mil/uL (ref 3.87–5.11)
WBC: 7.2 10*3/uL (ref 4.5–10.5)

## 2012-09-07 LAB — COMPREHENSIVE METABOLIC PANEL
AST: 21 U/L (ref 0–37)
Albumin: 3.8 g/dL (ref 3.5–5.2)
Alkaline Phosphatase: 75 U/L (ref 39–117)
BUN: 11 mg/dL (ref 6–23)
Glucose, Bld: 85 mg/dL (ref 70–99)
Potassium: 4 mEq/L (ref 3.5–5.1)
Sodium: 139 mEq/L (ref 135–145)
Total Bilirubin: 0.6 mg/dL (ref 0.3–1.2)
Total Protein: 7.3 g/dL (ref 6.0–8.3)

## 2012-09-07 MED ORDER — OMEPRAZOLE 40 MG PO CPDR: 40.0000 mg | DELAYED_RELEASE_CAPSULE | Freq: Every day | ORAL | Status: DC

## 2012-09-07 NOTE — Patient Instructions (Addendum)
I do think part of these symptoms are coming from reflux and too much acid in stomach. Treat reflux with omeprazole 40mg  daily for the next 3 weeks then as needed. For blood in stool - we have sent you home with stool kit and are checking blood counts. We may refer you to stomach doctor for further evaluation. Stool kit today. Blood work today.

## 2012-09-07 NOTE — Assessment & Plan Note (Signed)
Blood in stool with abd discomfort.  Denies h/o hemorrhoids. Anticipate component of dyspepsia and possible PUD - start omeprazole 40mg  daily. Check blood work today - sent home with stool kit as well. Not consistent with divertic.  Doubt IBD.  May need colonoscopy/endoscopy. Will refer to GI for further eval given abd pain associated with BM changes and blood.

## 2012-09-07 NOTE — Progress Notes (Addendum)
Subjective:    Patient ID: Gabrielle Dominguez, female    DOB: December 31, 1975, 37 y.o.   MRN: [id number]  HPI CC: abd pain, blood in stool.  For last few weeks having malaise.  BMs have changed as well - more constipation with harder stools.  When wiping, noticed some blood.  Then on Friday when awoke - had bad headache, and had episode of diarrhea - lots of blood mixed with stool.  Describes red-orange color, not bright red, not black tarry.  Occasional nausea.  Having some abdominal discomfort and nausea with meals for last few months.  Discomfort described as dull ache.  No boring pain to back.  Since this episode, not as constipated, stool looking back to normal.  Occasional h/o GERD.  Notices pizza causes burning sensation in mid abdomen. For last 2 yrs, increased bloating, gas, to point of taking gas X daily.  Has also been having headaches and feeling fatigued.  Maternal aunt with colon cancer at age 67yo.  Strong fm hx melanoma.   Mother with h/o ulcers.  No fmhx IBD.  Denies fevers/chills, vomiting.  No weight loss.  No early satiety.  No dysphagia. Denies joint pains, eye problems, oral lesions, new skin rashes. Rare alcohol.  Rare NSAID use. Wt Readings from Last 3 Encounters:  09/07/12 228 lb 12 oz (103.76 kg)  08/20/12 228 lb 4 oz (103.534 kg)  10/10/11 223 lb (101.152 kg)    Medications and allergies reviewed and updated in chart.  Past histories reviewed and updated if relevant as below. Patient Active Problem List  Diagnosis  . HEMATURIA UNSPECIFIED  . History of endometriosis  . Hypothyroidism  . Anxiety and depression  . Obesity   Past Medical History  Diagnosis Date  . History of endometriosis   . Abnormal Pap smear   . Depression with anxiety   . Worsening headaches   . Hypothyroid   . History of chicken pox   . History of pneumonia 07/2010, 04/2011   Past Surgical History  Procedure Laterality Date  . Laparoscopic endometriosis fulguration  2002 and  2009  . Cesarean section  2000   History  Substance Use Topics  . Smoking status: Former Smoker    Quit date: 07/15/2002  . Smokeless tobacco: Never Used  . Alcohol Use: No   Family History  Problem Relation Age of Onset  . Heart attack Paternal Grandfather   . Hypertension Paternal Grandfather   . Hypertension Maternal Grandmother   . Hypertension Father   . Cancer Father     melanoma  . Cancer Mother     vulvar  . Hypertension Mother   . Stroke Mother   . Cancer Maternal Aunt 40    colon cancer  . Hypertension Brother   . Stroke Maternal Grandfather   . Coronary artery disease Neg Hx   . Diabetes Neg Hx   . Cancer Cousin     melanoma   Allergies  Allergen Reactions  . Penicillins    Current Outpatient Prescriptions on File Prior to Visit  Medication Sig Dispense Refill  . Leuprolide Acetate (LUPRON IJ) Inject as directed every 3 (three) months.      . levothyroxine (SYNTHROID, LEVOTHROID) 88 MCG tablet Take 1 tablet (88 mcg total) by mouth daily.  90 tablet  3   Current Facility-Administered Medications on File Prior to Visit  Medication Dose Route Frequency Provider Last Rate Last Dose  . leuprolide (LUPRON) injection 11.25 mg  11.25 mg Intramuscular Q90 days  Allie Bossier, MD   11.25 mg at 11/27/11 1507     Review of Systems Per HPI    Objective:   Physical Exam  Nursing note and vitals reviewed. Constitutional: She appears well-developed and well-nourished. No distress.  HENT:  Head: Normocephalic and atraumatic.  Mouth/Throat: Oropharynx is clear and moist. No oropharyngeal exudate.  Eyes: Conjunctivae and EOM are normal. Pupils are equal, round, and reactive to light. No scleral icterus.  Neck: Normal range of motion. Neck supple.  Cardiovascular: Normal rate, regular rhythm, normal heart sounds and intact distal pulses.   No murmur heard. Pulmonary/Chest: Effort normal and breath sounds normal. No respiratory distress. She has no wheezes. She has no  rales.  Abdominal: Soft. Bowel sounds are normal. She exhibits no distension. There is no tenderness. There is no rebound and no guarding.  Genitourinary:  Pt declined rectal exam  Musculoskeletal: She exhibits no edema.  Skin: Skin is warm and dry. No rash noted.       Assessment & Plan:

## 2012-09-09 ENCOUNTER — Other Ambulatory Visit: Payer: PRIVATE HEALTH INSURANCE

## 2012-09-09 ENCOUNTER — Telehealth: Payer: Self-pay

## 2012-09-09 NOTE — Telephone Encounter (Signed)
Pt request 09/07/12 lab results; letter already mailed and pt notified as instructed by result note.

## 2012-09-11 ENCOUNTER — Telehealth: Payer: Self-pay | Admitting: *Deleted

## 2012-09-11 NOTE — Telephone Encounter (Signed)
Noted. Agree.

## 2012-09-11 NOTE — Telephone Encounter (Signed)
Patient called and said she had another episode of BRB in her stool today. It only happened once. It was in her stool, not in the toilet and she had a tiny bit on the toilet paper. She denies any abd pain, nausea, vomiting or diarrhea at this time. Her appt with GI is 09/21/12. She wanted to make you aware. I advised to eat bland, soft foods over the weekend and if she developed any severe abd pain or BRB filling toilet to seek urgent care. She verbalized understanding.

## 2012-09-21 ENCOUNTER — Ambulatory Visit (INDEPENDENT_AMBULATORY_CARE_PROVIDER_SITE_OTHER): Payer: PRIVATE HEALTH INSURANCE | Admitting: Gastroenterology

## 2012-09-21 ENCOUNTER — Encounter: Payer: Self-pay | Admitting: Gastroenterology

## 2012-09-21 VITALS — BP 118/84 | HR 68 | Ht 64.0 in | Wt 231.4 lb

## 2012-09-21 DIAGNOSIS — R1013 Epigastric pain: Secondary | ICD-10-CM

## 2012-09-21 DIAGNOSIS — R197 Diarrhea, unspecified: Secondary | ICD-10-CM

## 2012-09-21 DIAGNOSIS — K921 Melena: Secondary | ICD-10-CM

## 2012-09-21 MED ORDER — PEG-KCL-NACL-NASULF-NA ASC-C 100 G PO SOLR: 1.0000 | Freq: Once | ORAL | Status: DC

## 2012-09-21 MED ORDER — HYOSCYAMINE SULFATE 0.125 MG SL SUBL: SUBLINGUAL_TABLET | SUBLINGUAL | Status: DC

## 2012-09-21 NOTE — Patient Instructions (Addendum)
You have been scheduled for a colonoscopy with propofol. Please follow written instructions given to you at your visit today.  Please pick up your prep kit at the pharmacy within the next 1-3 days. If you use inhalers (even only as needed), please bring them with you on the day of your procedure.  We have sent the following medications to your pharmacy for you to pick up at your convenience: Levsin.  Use over the counter Gas-X before meals for gas and bloating.   Thank you for choosing me and [facility].  Venita Lick. Pleas Koch., MD., Clementeen Graham

## 2012-09-21 NOTE — Progress Notes (Signed)
History of Present Illness: This is a 37 year old female who relates a one year history of postprandial epigastric pain, gas, bloating and loose, urgent watery stools. She states she's been under more stress for the past year. Over the past few weeks she has noted 3 or 4 episodes of small amounts of bright red blood with bowel movements. She is noted about a 30 pound weight gain since resuming Lupron one and one half years ago. Started on omeprazole and her symptoms slightly improved. Denies weight loss, constipation, change in stool caliber, melena, nausea, vomiting, dysphagia, reflux symptoms, chest pain.  Review of Systems: Pertinent positive and negative review of systems were noted in the above HPI section. All other review of systems were otherwise negative.  Current Medications, Allergies, Past Medical History, Past Surgical History, Family History and Social History were reviewed in [employer] record.  Physical Exam: General: Well developed , well nourished, no acute distress Head: Normocephalic and atraumatic Eyes:  sclerae anicteric, EOMI Ears: Normal auditory acuity Mouth: No deformity or lesions Neck: Supple, no masses or thyromegaly Lungs: Clear throughout to auscultation Heart: Regular rate and rhythm; no murmurs, rubs or bruits Abdomen: Soft, non tender and non distended. No masses, hepatosplenomegaly or hernias noted. Normal Bowel sounds Rectal: Patient declines. She states even the thought of a rectal exam makes her extremely anxious which may lead to panic attack. Musculoskeletal: Symmetrical with no gross deformities  Skin: No lesions on visible extremities Pulses:  Normal pulses noted Extremities: No clubbing, cyanosis, edema or deformities noted Neurological: Alert oriented x 4, grossly nonfocal Cervical Nodes:  No significant cervical adenopathy Inguinal Nodes: No significant inguinal adenopathy Psychological:  Alert and cooperative. Normal mood  and affect  Assessment and Recommendations:  1. Suspected irritable bowel syndrome with a benign source of anorectal bleeding such as hemorrhoids. Rule out inflammatory bowel disease, colorectal neoplasms and other disorders. Schedule colonoscopy. Begin a low gas diet. Trial of hyoscyamine and Gas-X before meals. Continue omeprazole for now. The risks, benefits, and alternatives to colonoscopy with possible biopsy and possible polypectomy were discussed with the patient and they consent to proceed.

## 2012-09-24 ENCOUNTER — Ambulatory Visit (INDEPENDENT_AMBULATORY_CARE_PROVIDER_SITE_OTHER): Payer: PRIVATE HEALTH INSURANCE | Admitting: Family Medicine

## 2012-09-24 ENCOUNTER — Encounter: Payer: Self-pay | Admitting: Family Medicine

## 2012-09-24 VITALS — BP 128/84 | HR 68 | Resp 16 | Ht 64.0 in | Wt 231.0 lb

## 2012-09-24 DIAGNOSIS — Z01419 Encounter for gynecological examination (general) (routine) without abnormal findings: Secondary | ICD-10-CM

## 2012-09-24 DIAGNOSIS — Z8742 Personal history of other diseases of the female genital tract: Secondary | ICD-10-CM

## 2012-09-24 MED ORDER — NORETHINDRONE ACETATE 5 MG PO TABS: 5.0000 mg | ORAL_TABLET | Freq: Every day | ORAL | Status: DC

## 2012-09-24 NOTE — Patient Instructions (Signed)
Exercise to Lose Weight Exercise and a healthy diet may help you lose weight. Your doctor may suggest specific exercises. EXERCISE IDEAS AND TIPS  Choose low-cost things you enjoy doing, such as walking, bicycling, or exercising to workout videos.  Take stairs instead of the elevator.  Walk during your lunch break.  Park your car further away from work or school.  Go to a gym or an exercise class.  Start with 5 to 10 minutes of exercise each day. Build up to 30 minutes of exercise 4 to 6 days a week.  Wear shoes with good support and comfortable clothes.  Stretch before and after working out.  Work out until you breathe harder and your heart beats faster.  Drink extra water when you exercise.  Do not do so much that you hurt yourself, feel dizzy, or get very short of breath. Exercises that burn about 150 calories:  Running 1  miles in 15 minutes.  Playing volleyball for 45 to 60 minutes.  Washing and waxing a car for 45 to 60 minutes.  Playing touch football for 45 minutes.  Walking 1  miles in 35 minutes.  Pushing a stroller 1  miles in 30 minutes.  Playing basketball for 30 minutes.  Raking leaves for 30 minutes.  Bicycling 5 miles in 30 minutes.  Walking 2 miles in 30 minutes.  Dancing for 30 minutes.  Shoveling snow for 15 minutes.  Swimming laps for 20 minutes.  Walking up stairs for 15 minutes.  Bicycling 4 miles in 15 minutes.  Gardening for 30 to 45 minutes.  Jumping rope for 15 minutes.  Washing windows or floors for 45 to 60 minutes. Document Released: 08/03/2010 Document Revised: 09/23/2011 Document Reviewed: 08/03/2010 [facility] Patient Information 2013 [place]. Calorie Counting Diet A calorie counting diet requires you to eat the number of calories that are right for you in a day. Calories are the measurement of how much energy you get from the food you eat. Eating the right amount of calories is important for staying at a  healthy weight. If you eat too many calories, your body will store them as fat and you may gain weight. If you eat too few calories, you may lose weight. Counting the number of calories you eat during a day will help you know if you are eating the right amount. A Registered Dietitian can determine how many calories you need in a day. The amount of calories needed varies from person to person. If your goal is to lose weight, you will need to eat fewer calories. Losing weight can benefit you if you are overweight or have health problems such as heart disease, high blood pressure, or diabetes. If your goal is to gain weight, you will need to eat more calories. Gaining weight may be necessary if you have a certain health problem that causes your body to need more energy. TIPS Whether you are increasing or decreasing the number of calories you eat during a day, it may be hard to get used to changes in what you eat and drink. The following are tips to help you keep track of the number of calories you eat.  Measure foods at home with measuring cups. This helps you know the amount of food and number of calories you are eating.  Restaurants often serve food in amounts that are larger than 1 serving. While eating out, estimate how many servings of a food you are given. For example, a serving of cooked rice  is  cup or about the size of half of a fist. Knowing serving sizes will help you be aware of how much food you are eating at restaurants.  Ask for smaller portion sizes or child-size portions at restaurants.  Plan to eat half of a meal at a restaurant. Take the rest home or share the other half with a friend.  Read the Nutrition Facts panel on food labels for calorie content and serving size. You can find out how many servings are in a package, the size of a serving, and the number of calories each serving has.  For example, a package might contain 3 cookies. The Nutrition Facts panel on that package says  that 1 serving is 1 cookie. Below that, it will say there are 3 servings in the container. The calories section of the Nutrition Facts label says there are 90 calories. This means there are 90 calories in 1 cookie (1 serving). If you eat 1 cookie you have eaten 90 calories. If you eat all 3 cookies, you have eaten 270 calories (3 servings x 90 calories = 270 calories). The list below tells you how big or small some common portion sizes are.  1 oz.........4 stacked dice.  3 oz........Marland KitchenDeck of cards.  1 tsp.......Marland KitchenTip of little finger.  1 tbs......Marland KitchenMarland KitchenThumb.  2 tbs.......Marland KitchenGolf ball.   cup......Marland KitchenHalf of a fist.  1 cup.......Marland KitchenA fist. KEEP A FOOD LOG Write down every food item you eat, the amount you eat, and the number of calories in each food you eat during the day. At the end of the day, you can add up the total number of calories you have eaten. It may help to keep a list like the one below. Find out the calorie information by reading the Nutrition Facts panel on food labels. Breakfast  Bran cereal (1 cup, 110 calories).  Fat-free milk ( cup, 45 calories). Snack  Apple (1 medium, 80 calories). Lunch  Spinach (1 cup, 20 calories).  Tomato ( medium, 20 calories).  Chicken breast strips (3 oz, 165 calories).  Shredded cheddar cheese ( cup, 110 calories).  Light [place] dressing (2 tbs, 60 calories).  Whole-wheat bread (1 slice, 80 calories).  Tub margarine (1 tsp, 35 calories).  Vegetable soup (1 cup, 160 calories). Dinner  Pork chop (3 oz, 190 calories).  Brown rice (1 cup, 215 calories).  Steamed broccoli ( cup, 20 calories).  Strawberries (1  cup, 65 calories).  Whipped cream (1 tbs, 50 calories). Daily Calorie Total: 1425 Document Released: 07/01/2005 Document Revised: 09/23/2011 Document Reviewed: 12/26/2006 [facility] Patient Information 2013 [place], [place]. Hysterectomy Information  A hysterectomy is a procedure where your uterus is surgically removed.  It will no longer be possible to have menstrual periods or to become pregnant. The tubes and ovaries can be removed (bilateral salpingo-oopherectomy) during this surgery as well.  REASONS FOR A HYSTERECTOMY  Persistent, abnormal bleeding.  Lasting (chronic) pelvic pain or infection.  The lining of the uterus (endometrium) starts growing outside the uterus (endometriosis).  The endometrium starts growing in the muscle of the uterus (adenomyosis).  The uterus falls down into the vagina (pelvic organ prolapse).  Symptomatic uterine fibroids.  Precancerous cells.  Cervical cancer or uterine cancer. TYPES OF HYSTERECTOMIES  Supracervical hysterectomy. This type removes the top part of the uterus, but not the cervix.  Total hysterectomy. This type removes the uterus and cervix.  Radical hysterectomy. This type removes the uterus, cervix, and the fibrous tissue that holds the uterus in place  in the pelvis (parametrium). WAYS A HYSTERECTOMY CAN BE PERFORMED  Abdominal hysterectomy. A large surgical cut (incision) is made in the abdomen. The uterus is removed through this incision.  Vaginal hysterectomy. An incision is made in the vagina. The uterus is removed through this incision. There are no abdominal incisions.  Conventional laparoscopic hysterectomy. A thin, lighted tube with a camera (laparoscope) is inserted into 3 or 4 small incisions in the abdomen. The uterus is cut into small pieces. The small pieces are removed through the incisions, or they are removed through the vagina.  Laparoscopic assisted vaginal hysterectomy (LAVH). Three or four small incisions are made in the abdomen. Part of the surgery is performed laparoscopically and part vaginally. The uterus is removed through the vagina.  Robot-assisted laparoscopic hysterectomy. A laparoscope is inserted into 3 or 4 small incisions in the abdomen. A computer-controlled device is used to give the surgeon a 3D image. This allows  for more precise movements of surgical instruments. The uterus is cut into small pieces and removed through the incisions or removed through the vagina. RISKS OF HYSTERECTOMY   Bleeding and risk of blood transfusion. Tell your caregiver if you do not want to receive any blood products.  Blood clots in the legs or lung.  Infection.  Injury to surrounding organs.  Anesthesia problems or side effects.  Conversion to an abdominal hysterectomy. WHAT TO EXPECT AFTER A HYSTERECTOMY  You will be given pain medicine.  You will need to have someone with you for the first 3 to 5 days after you go home.  You will need to follow up with your surgeon in 2 to 4 weeks after surgery to evaluate your progress.  You may have early menopause symptoms like hot flashes, night sweats, and insomnia.  If you had a hysterectomy for a problem that was not a cancer or a condition that could lead to cancer, then you no longer need Pap tests. However, even if you no longer need a Pap test, a regular exam is a good idea to make sure no other problems are starting. Document Released: 12/25/2000 Document Revised: 09/23/2011 Document Reviewed: 02/09/2011 [facility] Patient Information 2013 [place], [place]. Endometriosis Endometriosis is a disease that occurs when the endometrium (lining of the uterus) is misplaced outside of its normal location. It may occur in many locations close to the uterus (womb), but commonly on the ovaries, fallopian tubes, vagina (birth canal) and bowel located close to the uterus. Because the uterus sloughs (expels) its lining every month (menses), there is bleeding whereever the endometrial tissue is located. SYMPTOMS  Often there are no symptoms. However, because blood is irritating to tissues not normally exposed to it, when symptoms occur they vary with the location of the misplaced endometrium. Symptoms often include back and abdominal pain. Periods may be heavier and intercourse may be  painful. Infertility may be present. You may have all of these symptoms at one time or another or you may have months with no symptoms at all. Although the symptoms occur mainly during menses, they can occur mid-cycle as well, and usually terminate with menopause. DIAGNOSIS  Your caregiver may recommend a blood test and urine test (urinalysis) to help rule out other conditions. Another common test is ultrasound, a painless procedure that uses sound waves to make a sonogram "picture" of abnormal tissue that could be endometriosis. If your bowel movements are painful around your periods, your caregiver may advise a barium enema (an X-ray of the lower bowel), to  try to find the source of your pain. This is sometimes confirmed by laparoscopy. Laparoscopy is a procedure where your caregiver looks into your abdomen with a laparoscope (a small pencil sized telescope). Your caregiver may take a tiny piece of tissue (biopsy) from any abnormal tissue to confirm or document your problem. These tissues are sent to the lab and a pathologist looks at them under the microscope to give a microscopic diagnosis. TREATMENT  Once the diagnosis is made, it can be treated by destruction of the misplaced endometrial tissue using heat (diathermy), laser, cutting (excision), or chemical means. It may also be treated with hormonal therapy. When using hormonal therapy menses are eliminated, therefore eliminating the monthly exposure to blood by the misplaced endometrial tissue. Only in severe cases is it necessary to perform a hysterectomy with removal of the tubes, uterus and ovaries. HOME CARE INSTRUCTIONS   Only take over-the-counter or prescription medicines for pain, discomfort, or fever as directed by your caregiver.  Avoid activities that produce pain, including a physical sexual relationship.  Do not take aspirin as this may increase bleeding when not on hormonal therapy.  See your caregiver for pain or problems not  controlled with treatment. SEEK IMMEDIATE MEDICAL CARE IF:   Your pain is severe and is not responding to pain medicine.  You develop severe nausea and vomiting, or you cannot keep foods down.  Your pain localizes to the right lower part of your abdomen (possible appendicitis).  You have swelling or increasing pain in the abdomen.  You have a fever.  You see blood in your stool. MAKE SURE YOU:   Understand these instructions.  Will watch your condition.  Will get help right away if you are not doing well or get worse. Document Released: 06/28/2000 Document Revised: 09/23/2011 Document Reviewed: 02/17/2008 [facility] Patient Information 2013 [place], [place]. Preventive Care for Adults, Female A healthy lifestyle and preventive care can promote health and wellness. Preventive health guidelines for women include the following key practices.  A routine yearly physical is a good way to check with your caregiver about your health and preventive screening. It is a chance to share any concerns and updates on your health, and to receive a thorough exam.  Visit your dentist for a routine exam and preventive care every 6 months. Brush your teeth twice a day and floss once a day. Good oral hygiene prevents tooth decay and gum disease.  The frequency of eye exams is based on your age, health, family medical history, use of contact lenses, and other factors. Follow your caregiver's recommendations for frequency of eye exams.  Eat a healthy diet. Foods like vegetables, fruits, whole grains, low-fat dairy products, and lean protein foods contain the nutrients you need without too many calories. Decrease your intake of foods high in solid fats, added sugars, and salt. Eat the right amount of calories for you.Get information about a proper diet from your caregiver, if necessary.  Regular physical exercise is one of the most important things you can do for your health. Most adults should get at least  150 minutes of moderate-intensity exercise (any activity that increases your heart rate and causes you to sweat) each week. In addition, most adults need muscle-strengthening exercises on 2 or more days a week.  Maintain a healthy weight. The body mass index (BMI) is a screening tool to identify possible weight problems. It provides an estimate of body fat based on height and weight. Your caregiver can help determine your BMI,  and can help you achieve or maintain a healthy weight.For adults 20 years and older:  A BMI below 18.5 is considered underweight.  A BMI of 18.5 to 24.9 is normal.  A BMI of 25 to 29.9 is considered overweight.  A BMI of 30 and above is considered obese.  Maintain normal blood lipids and cholesterol levels by exercising and minimizing your intake of saturated fat. Eat a balanced diet with plenty of fruit and vegetables. Blood tests for lipids and cholesterol should begin at age 34 and be repeated every 5 years. If your lipid or cholesterol levels are high, you are over 50, or you are at high risk for heart disease, you may need your cholesterol levels checked more frequently.Ongoing high lipid and cholesterol levels should be treated with medicines if diet and exercise are not effective.  If you smoke, find out from your caregiver how to quit. If you do not use tobacco, do not start.  If you are pregnant, do not drink alcohol. If you are breastfeeding, be very cautious about drinking alcohol. If you are not pregnant and choose to drink alcohol, do not exceed 1 drink per day. One drink is considered to be 12 ounces (355 mL) of beer, 5 ounces (148 mL) of wine, or 1.5 ounces (44 mL) of liquor.  Avoid use of street drugs. Do not share needles with anyone. Ask for help if you need support or instructions about stopping the use of drugs.  High blood pressure causes heart disease and increases the risk of stroke. Your blood pressure should be checked at least every 1 to 2 years.  Ongoing high blood pressure should be treated with medicines if weight loss and exercise are not effective.  If you are 39 to 38 years old, ask your caregiver if you should take aspirin to prevent strokes.  Diabetes screening involves taking a blood sample to check your fasting blood sugar level. This should be done once every 3 years, after age 39, if you are within normal weight and without risk factors for diabetes. Testing should be considered at a younger age or be carried out more frequently if you are overweight and have at least 1 risk factor for diabetes.  Breast cancer screening is essential preventive care for women. You should practice "breast self-awareness." This means understanding the normal appearance and feel of your breasts and may include breast self-examination. Any changes detected, no matter how small, should be reported to a caregiver. Women in their 38s and 30s should have a clinical breast exam (CBE) by a caregiver as part of a regular health exam every 1 to 3 years. After age 59, women should have a CBE every year. Starting at age 34, women should consider having a mammography (breast X-ray test) every year. Women who have a family history of breast cancer should talk to their caregiver about genetic screening. Women at a high risk of breast cancer should talk to their caregivers about having magnetic resonance imaging (MRI) and a mammography every year.  The Pap test is a screening test for cervical cancer. A Pap test can show cell changes on the cervix that might become cervical cancer if left untreated. A Pap test is a procedure in which cells are obtained and examined from the lower end of the uterus (cervix).  Women should have a Pap test starting at age 43.  Between ages 17 and 66, Pap tests should be repeated every 2 years.  Beginning at age 86, you  should have a Pap test every 3 years as long as the past 3 Pap tests have been normal.  Some women have medical  problems that increase the chance of getting cervical cancer. Talk to your caregiver about these problems. It is especially important to talk to your caregiver if a new problem develops soon after your last Pap test. In these cases, your caregiver may recommend more frequent screening and Pap tests.  The above recommendations are the same for women who have or have not gotten the vaccine for human papillomavirus (HPV).  If you had a hysterectomy for a problem that was not cancer or a condition that could lead to cancer, then you no longer need Pap tests. Even if you no longer need a Pap test, a regular exam is a good idea to make sure no other problems are starting.  If you are between ages 71 and 22, and you have had normal Pap tests going back 10 years, you no longer need Pap tests. Even if you no longer need a Pap test, a regular exam is a good idea to make sure no other problems are starting.  If you have had past treatment for cervical cancer or a condition that could lead to cancer, you need Pap tests and screening for cancer for at least 20 years after your treatment.  If Pap tests have been discontinued, risk factors (such as a new sexual partner) need to be reassessed to determine if screening should be resumed.  The HPV test is an additional test that may be used for cervical cancer screening. The HPV test looks for the virus that can cause the cell changes on the cervix. The cells collected during the Pap test can be tested for HPV. The HPV test could be used to screen women aged 34 years and older, and should be used in women of any age who have unclear Pap test results. After the age of 63, women should have HPV testing at the same frequency as a Pap test.  Colorectal cancer can be detected and often prevented. Most routine colorectal cancer screening begins at the age of 32 and continues through age 27. However, your caregiver may recommend screening at an earlier age if you have risk  factors for colon cancer. On a yearly basis, your caregiver may provide home test kits to check for hidden blood in the stool. Use of a small camera at the end of a tube, to directly examine the colon (sigmoidoscopy or colonoscopy), can detect the earliest forms of colorectal cancer. Talk to your caregiver about this at age 68, when routine screening begins. Direct examination of the colon should be repeated every 5 to 10 years through age 30, unless early forms of pre-cancerous polyps or small growths are found.  Hepatitis C blood testing is recommended for all people born from [birth year] through 1966 and any individual with known risks for hepatitis C.  Practice safe sex. Use condoms and avoid high-risk sexual practices to reduce the spread of sexually transmitted infections (STIs). STIs include gonorrhea, chlamydia, syphilis, trichomonas, herpes, HPV, and human immunodeficiency virus (HIV). Herpes, HIV, and HPV are viral illnesses that have no cure. They can result in disability, cancer, and death. Sexually active women aged 26 and younger should be checked for chlamydia. Older women with new or multiple partners should also be tested for chlamydia. Testing for other STIs is recommended if you are sexually active and at increased risk.  Osteoporosis is a disease in  which the bones lose minerals and strength with aging. This can result in serious bone fractures. The risk of osteoporosis can be identified using a bone density scan. Women ages 43 and over and women at risk for fractures or osteoporosis should discuss screening with their caregivers. Ask your caregiver whether you should take a calcium supplement or vitamin D to reduce the rate of osteoporosis.  Menopause can be associated with physical symptoms and risks. Hormone replacement therapy is available to decrease symptoms and risks. You should talk to your caregiver about whether hormone replacement therapy is right for you.  Use sunscreen with sun  protection factor (SPF) of 30 or more. Apply sunscreen liberally and repeatedly throughout the day. You should seek shade when your shadow is shorter than you. Protect yourself by wearing long sleeves, pants, a wide-brimmed hat, and sunglasses year round, whenever you are outdoors.  Once a month, do a whole body skin exam, using a mirror to look at the skin on your back. Notify your caregiver of new moles, moles that have irregular borders, moles that are larger than a pencil eraser, or moles that have changed in shape or color.  Stay current with required immunizations.  Influenza. You need a dose every fall (or winter). The composition of the flu vaccine changes each year, so being vaccinated once is not enough.  Pneumococcal polysaccharide. You need 1 to 2 doses if you smoke cigarettes or if you have certain chronic medical conditions. You need 1 dose at age 6 (or older) if you have never been vaccinated.  Tetanus, diphtheria, pertussis (Tdap, Td). Get 1 dose of Tdap vaccine if you are younger than age 10, are over 2 and have contact with an infant, are a Research scientist (physical sciences), are pregnant, or simply want to be protected from whooping cough. After that, you need a Td booster dose every 10 years. Consult your caregiver if you have not had at least 3 tetanus and diphtheria-containing shots sometime in your life or have a deep or dirty wound.  HPV. You need this vaccine if you are a woman age 69 or younger. The vaccine is given in 3 doses over 6 months.  Measles, mumps, rubella (MMR). You need at least 1 dose of MMR if you were born in 1957 or later. You may also need a second dose.  Meningococcal. If you are age 71 to 16 and a first-year college student living in a residence hall, or have one of several medical conditions, you need to get vaccinated against meningococcal disease. You may also need additional booster doses.  Zoster (shingles). If you are age 40 or older, you should get this  vaccine.  Varicella (chickenpox). If you have never had chickenpox or you were vaccinated but received only 1 dose, talk to your caregiver to find out if you need this vaccine.  Hepatitis A. You need this vaccine if you have a specific risk factor for hepatitis A virus infection or you simply wish to be protected from this disease. The vaccine is usually given as 2 doses, 6 to 18 months apart.  Hepatitis B. You need this vaccine if you have a specific risk factor for hepatitis B virus infection or you simply wish to be protected from this disease. The vaccine is given in 3 doses, usually over 6 months. Preventive Services / Frequency Ages 77 to 55  Blood pressure check.** / Every 1 to 2 years.  Lipid and cholesterol check.** / Every 5 years beginning at age  20.  Clinical breast exam.** / Every 3 years for women in their 58s and 30s.  Pap test.** / Every 2 years from ages 61 through 83. Every 3 years starting at age 88 through age 53 or 32 with a history of 3 consecutive normal Pap tests.  HPV screening.** / Every 3 years from ages 49 through ages 79 to 20 with a history of 3 consecutive normal Pap tests.  Hepatitis C blood test.** / For any individual with known risks for hepatitis C.  Skin self-exam. / Monthly.  Influenza immunization.** / Every year.  Pneumococcal polysaccharide immunization.** / 1 to 2 doses if you smoke cigarettes or if you have certain chronic medical conditions.  Tetanus, diphtheria, pertussis (Tdap, Td) immunization. / A one-time dose of Tdap vaccine. After that, you need a Td booster dose every 10 years.  HPV immunization. / 3 doses over 6 months, if you are 63 and younger.  Measles, mumps, rubella (MMR) immunization. / You need at least 1 dose of MMR if you were born in 1957 or later. You may also need a second dose.  Meningococcal immunization. / 1 dose if you are age 84 to 5 and a first-year college student living in a residence hall, or have one of  several medical conditions, you need to get vaccinated against meningococcal disease. You may also need additional booster doses.  Varicella immunization.** / Consult your caregiver.  Hepatitis A immunization.** / Consult your caregiver. 2 doses, 6 to 18 months apart.  Hepatitis B immunization.** / Consult your caregiver. 3 doses usually over 6 months. Ages 25 to 25  Blood pressure check.** / Every 1 to 2 years.  Lipid and cholesterol check.** / Every 5 years beginning at age 77.  Clinical breast exam.** / Every year after age 36.  Mammogram.** / Every year beginning at age 60 and continuing for as long as you are in good health. Consult with your caregiver.  Pap test.** / Every 3 years starting at age 82 through age 43 or 26 with a history of 3 consecutive normal Pap tests.  HPV screening.** / Every 3 years from ages 14 through ages 44 to 98 with a history of 3 consecutive normal Pap tests.  Fecal occult blood test (FOBT) of stool. / Every year beginning at age 51 and continuing until age 1. You may not need to do this test if you get a colonoscopy every 10 years.  Flexible sigmoidoscopy or colonoscopy.** / Every 5 years for a flexible sigmoidoscopy or every 10 years for a colonoscopy beginning at age 42 and continuing until age 84.  Hepatitis C blood test.** / For all people born from [birth year] through 1965 and any individual with known risks for hepatitis C.  Skin self-exam. / Monthly.  Influenza immunization.** / Every year.  Pneumococcal polysaccharide immunization.** / 1 to 2 doses if you smoke cigarettes or if you have certain chronic medical conditions.  Tetanus, diphtheria, pertussis (Tdap, Td) immunization.** / A one-time dose of Tdap vaccine. After that, you need a Td booster dose every 10 years.  Measles, mumps, rubella (MMR) immunization. / You need at least 1 dose of MMR if you were born in 1957 or later. You may also need a second dose.  Varicella immunization.** /  Consult your caregiver.  Meningococcal immunization.** / Consult your caregiver.  Hepatitis A immunization.** / Consult your caregiver. 2 doses, 6 to 18 months apart.  Hepatitis B immunization.** / Consult your caregiver. 3 doses, usually over 6  months. Ages 65 and over  Blood pressure check.** / Every 1 to 2 years.  Lipid and cholesterol check.** / Every 5 years beginning at age 80.  Clinical breast exam.** / Every year after age 24.  Mammogram.** / Every year beginning at age 84 and continuing for as long as you are in good health. Consult with your caregiver.  Pap test.** / Every 3 years starting at age 46 through age 98 or 56 with a 3 consecutive normal Pap tests. Testing can be stopped between 65 and 70 with 3 consecutive normal Pap tests and no abnormal Pap or HPV tests in the past 10 years.  HPV screening.** / Every 3 years from ages 77 through ages 44 or 52 with a history of 3 consecutive normal Pap tests. Testing can be stopped between 65 and 70 with 3 consecutive normal Pap tests and no abnormal Pap or HPV tests in the past 10 years.  Fecal occult blood test (FOBT) of stool. / Every year beginning at age 4 and continuing until age 5. You may not need to do this test if you get a colonoscopy every 10 years.  Flexible sigmoidoscopy or colonoscopy.** / Every 5 years for a flexible sigmoidoscopy or every 10 years for a colonoscopy beginning at age 90 and continuing until age 59.  Hepatitis C blood test.** / For all people born from [birth year] through 1965 and any individual with known risks for hepatitis C.  Osteoporosis screening.** / A one-time screening for women ages 73 and over and women at risk for fractures or osteoporosis.  Skin self-exam. / Monthly.  Influenza immunization.** / Every year.  Pneumococcal polysaccharide immunization.** / 1 dose at age 91 (or older) if you have never been vaccinated.  Tetanus, diphtheria, pertussis (Tdap, Td) immunization. / A one-time dose  of Tdap vaccine if you are over 65 and have contact with an infant, are a Research scientist (physical sciences), or simply want to be protected from whooping cough. After that, you need a Td booster dose every 10 years.  Varicella immunization.** / Consult your caregiver.  Meningococcal immunization.** / Consult your caregiver.  Hepatitis A immunization.** / Consult your caregiver. 2 doses, 6 to 18 months apart.  Hepatitis B immunization.** / Check with your caregiver. 3 doses, usually over 6 months. ** Family history and personal history of risk and conditions may change your caregiver's recommendations. Document Released: 08/27/2001 Document Revised: 09/23/2011 Document Reviewed: 11/26/2010 [facility] Patient Information 2013 [place], [place].

## 2012-09-24 NOTE — Progress Notes (Signed)
  Subjective:     Gabrielle Dominguez is a 37 y.o. female and is here for a comprehensive physical exam. The patient reports problems - endometriosis.  On Depo-Lupron for 18 mos.  No add-back therapy.  Considering hysterectomy.  Recent issues with epigastric discomfort and GI upset.  Also with blood in stools, for colonoscopy with GI next month..  History   Social History  . Marital Status: Single    Spouse Name: N/A    Number of Children: 1  . Years of Education: N/A   Occupational History  . office manager    Social History Main Topics  . Smoking status: Former Smoker    Types: Cigarettes    Quit date: 07/15/2002  . Smokeless tobacco: Never Used  . Alcohol Use: Yes     Comment: rarely- social  . Drug Use: No  . Sexually Active: Yes -- Female partner(s)    Birth Control/ Protection: None   Other Topics Concern  . Not on file   Social History Narrative   Caffeine: rarely   Lives with fiance, son (2000), mother, 1 dog   Occupation: unemployed   Edu: 12th grade   Activity: tries to walk   Diet: occasional fast food, lots of water, good fruits and vegetables, red meat 3x/wk   Health Maintenance  Topic Date Due  . Tetanus/tdap  03/30/1994  . Influenza Vaccine  03/15/2013  . Pap Smear  03/25/2014    The following portions of the patient's history were reviewed and updated as appropriate: allergies, current medications, past family history, past medical history, past social history, past surgical history and problem list.  Review of Systems Pertinent items are noted in HPI.   Objective:    BP 128/84  Pulse 68  Resp 16  Ht 5\' 4"  (1.626 m)  Wt 231 lb (104.781 kg)  BMI 39.63 kg/m2 General appearance: alert, cooperative, appears stated age and moderately obese Head: Normocephalic, without obvious abnormality, atraumatic Neck: no adenopathy, supple, symmetrical, trachea midline and thyroid not enlarged, symmetric, no tenderness/mass/nodules Lungs: clear to auscultation  bilaterally Breasts: normal appearance, no masses or tenderness Heart: regular rate and rhythm, S1, S2 normal, no murmur, click, rub or gallop Abdomen: soft, non-tender; bowel sounds normal; no masses,  no organomegaly Pelvic: cervix normal in appearance, external genitalia normal, no adnexal masses or tenderness, no cervical motion tenderness, uterus normal size, shape, and consistency, vagina normal without discharge and uterus feels mobile Extremities: edema lymphedema in left foot.  Has bunion on  right great toe. Pulses: 2+ and symmetric Skin: Skin color, texture, turgor normal. No rashes or lesions Lymph nodes: Cervical, supraclavicular, and axillary nodes normal. Neurologic: Grossly normal    Assessment:    Healthy female exam. Obesity and endometriosis. Current GI bleeding.      Plan:  Pap smear today. Continue Lupron until surgery.  Add norethinedrone for add-back therapy. Considering hysterectomy, which was discussed at length today.  Suggest removal of ovaries with topical HRT after.  Discussed ovarian removal and why it should be done in her case.  Also discussed risks of surgery, approach, which should probably be laparoscopic.  Last laparoscopy notes reviewed, no bowel adhesions noted.  She would like to have this done in fall of this year.  She will return for further discussion in late summer. Continue to f/u with GI as indicated.  Weight loss discussed including calorie counting, increasing activity.   See After Visit Summary for Counseling Recommendations

## 2012-09-25 ENCOUNTER — Encounter: Payer: Self-pay | Admitting: Gastroenterology

## 2012-09-30 IMAGING — US US SOFT TISSUE HEAD/NECK
1 series · 14 of 25 positions shown · non-contrast
Comparison: None.

CLINICAL DATA: Fullness and neck.  Difficulty swallowing.

THYROID ULTRASOUND
TECHNIQUE: Ultrasound examination of the thyroid gland and adjacent
soft tissues was performed.

[Series 1: us soft tissue head/neck · 0.08mm/px · 14 of 32 slices shown]
[im 1/32]
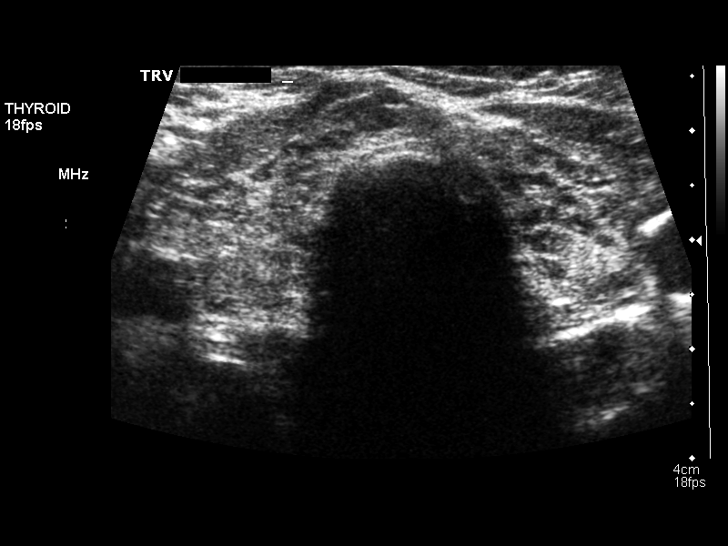
[im 3/32]
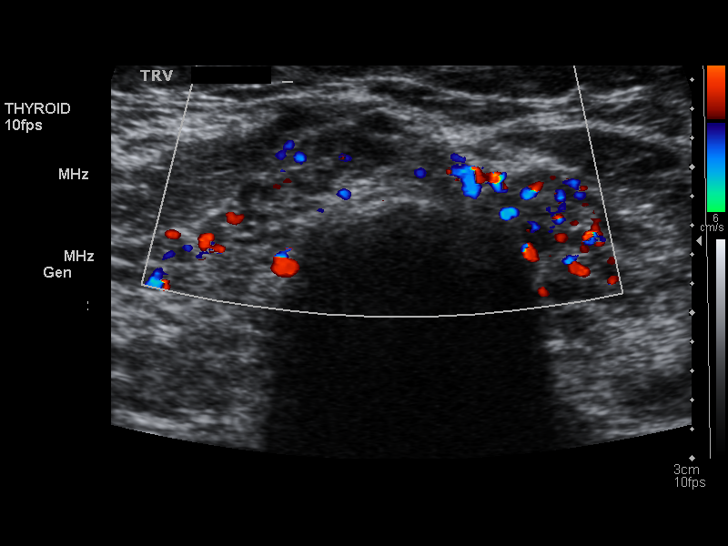
[im 6/32]
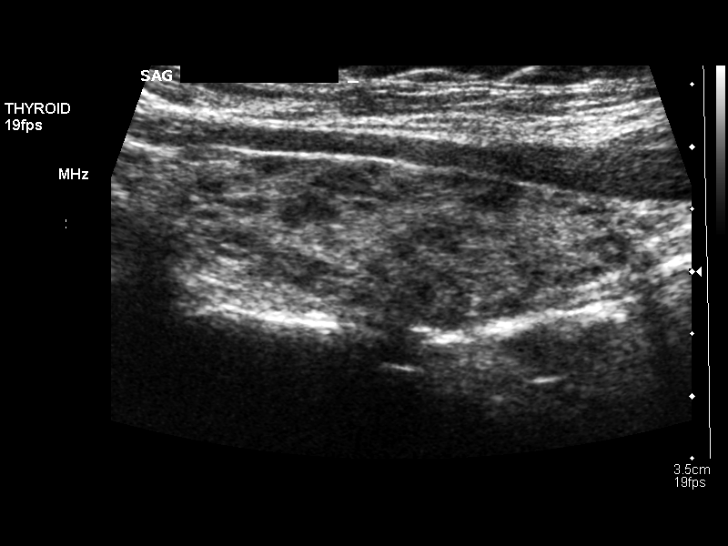
[im 8/32]
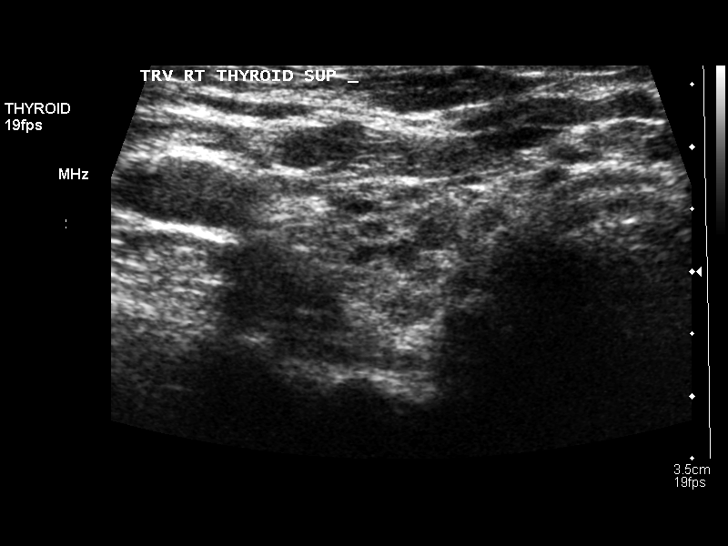
[im 11/32]
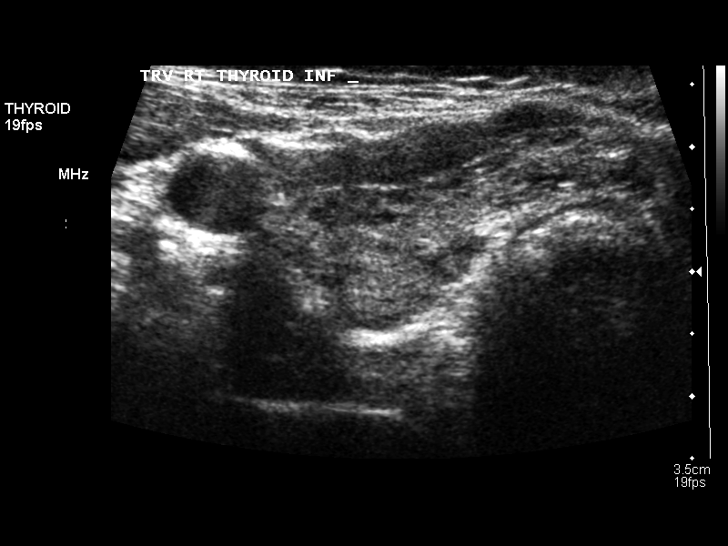
[im 12/32]
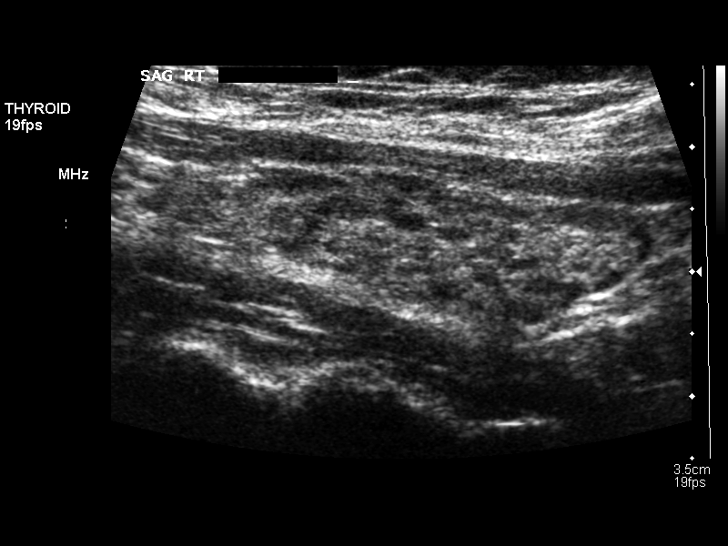
[im 15/32]
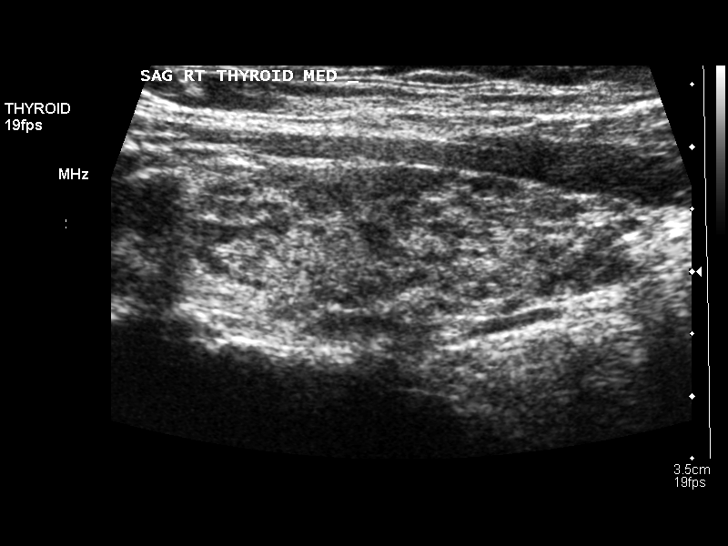
[im 17/32]
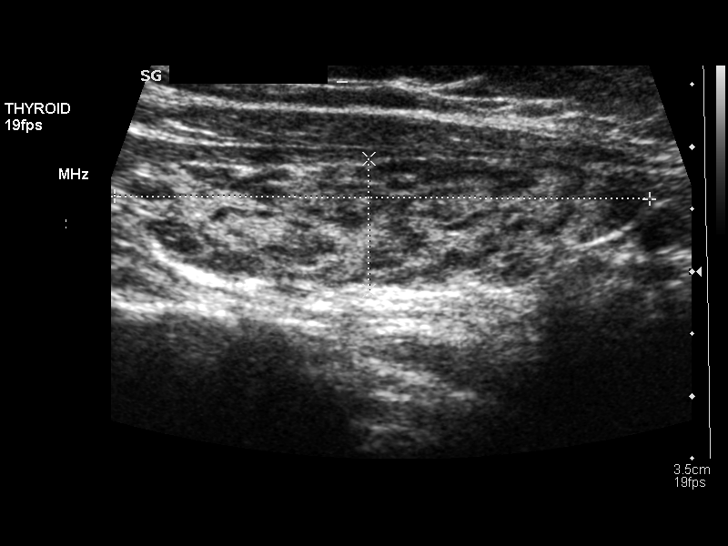
[im 20/32]
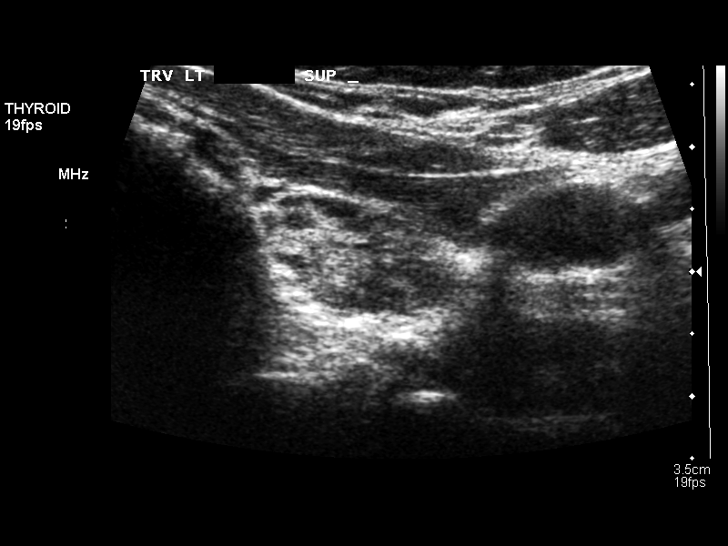
[im 21/32]
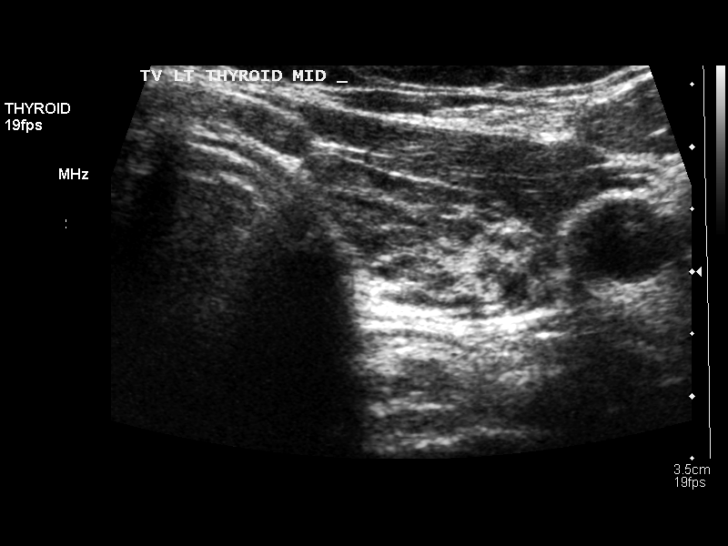
[im 24/32]
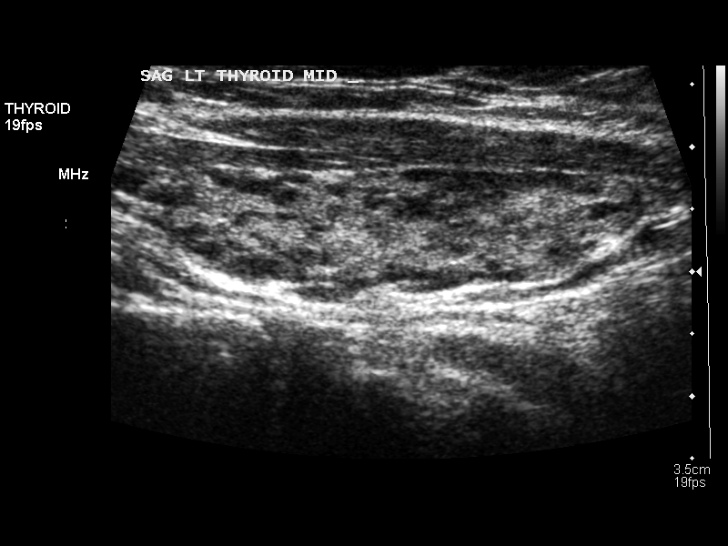
[im 26/32]
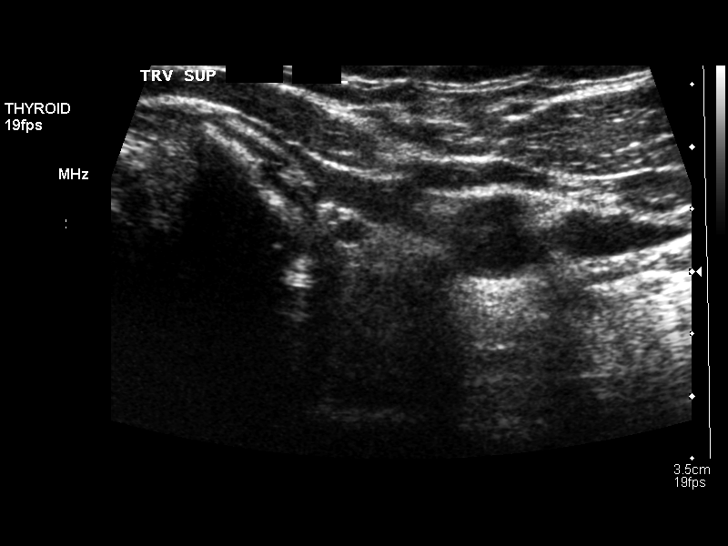
[im 29/32]
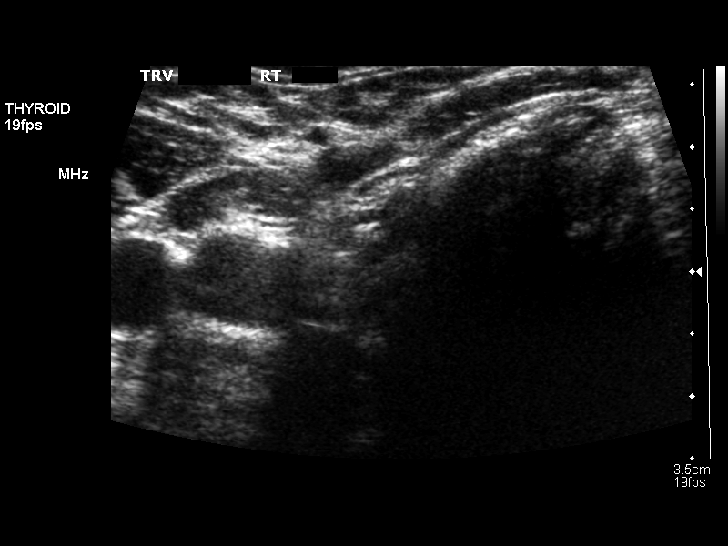
[im 32/32]
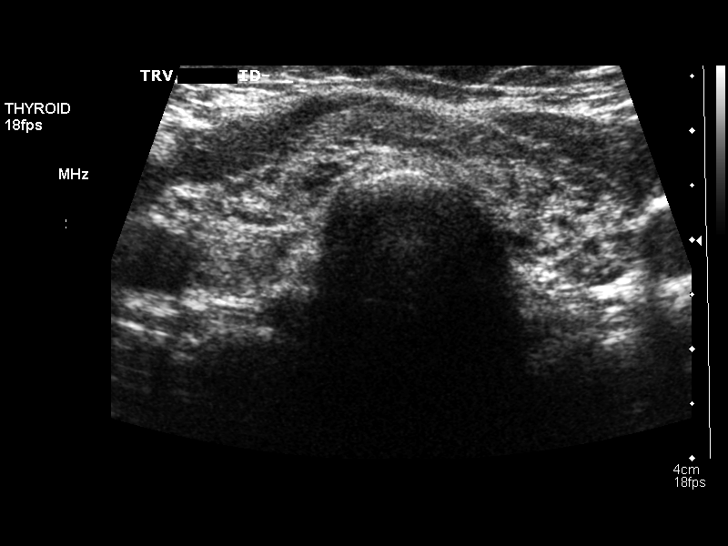

[14 of 25 positions shown; findings below may reference images not displayed]

FINDINGS: Right thyroid lobe:  4.0 x 1.3 x 1.8 cm.
Left thyroid lobe:  4.3 x 1.0 x 1.5 cm.
Isthmus:  6 mm in thickness.

Focal nodules:  The gland is markedly and diffusely heterogeneous
in echotexture.  No defined nodules can be identified.

Lymphadenopathy:  None visualized.
IMPRESSION: The gland is markedly heterogeneous but there are no defined
nodules.

## 2012-10-01 ENCOUNTER — Telehealth: Payer: Self-pay | Admitting: Gastroenterology

## 2012-10-01 NOTE — Telephone Encounter (Signed)
Free coupon sent to patient via mail. Patient advised.

## 2012-10-07 ENCOUNTER — Other Ambulatory Visit (INDEPENDENT_AMBULATORY_CARE_PROVIDER_SITE_OTHER): Payer: PRIVATE HEALTH INSURANCE

## 2012-10-07 DIAGNOSIS — E039 Hypothyroidism, unspecified: Secondary | ICD-10-CM

## 2012-10-07 LAB — TSH: TSH: 2.84 u[IU]/mL (ref 0.35–5.50)

## 2012-10-08 ENCOUNTER — Encounter: Payer: Self-pay | Admitting: *Deleted

## 2012-10-13 HISTORY — PX: COLONOSCOPY: SHX174

## 2012-10-22 ENCOUNTER — Encounter: Payer: Self-pay | Admitting: Gastroenterology

## 2012-10-22 ENCOUNTER — Ambulatory Visit (AMBULATORY_SURGERY_CENTER): Payer: PRIVATE HEALTH INSURANCE | Admitting: Gastroenterology

## 2012-10-22 VITALS — BP 123/78 | HR 64 | Temp 97.9°F | Resp 21 | Ht 64.0 in | Wt 231.0 lb

## 2012-10-22 DIAGNOSIS — K921 Melena: Secondary | ICD-10-CM

## 2012-10-22 DIAGNOSIS — R1013 Epigastric pain: Secondary | ICD-10-CM

## 2012-10-22 DIAGNOSIS — R197 Diarrhea, unspecified: Secondary | ICD-10-CM

## 2012-10-22 DIAGNOSIS — D126 Benign neoplasm of colon, unspecified: Secondary | ICD-10-CM

## 2012-10-22 MED ORDER — SODIUM CHLORIDE 0.9 % IV SOLN: 500.0000 mL | INTRAVENOUS | Status: DC

## 2012-10-22 NOTE — Progress Notes (Signed)
Called to room to assist during endoscopic procedure.  Patient ID and intended procedure confirmed with present staff. Received instructions for my participation in the procedure from the performing physician. ewm 

## 2012-10-22 NOTE — Op Note (Signed)
[facility] [address] [place], [address]   COLONOSCOPY PROCEDURE REPORT  PATIENT: Gabrielle, Dominguez  MR#: [id number] BIRTHDATE: Dec 10, 1975 , 36  yrs. old GENDER: Female ENDOSCOPIST: Meryl Dare, MD, [facility] REFERRED MV:[id number] Sharen Hones, M.D. PROCEDURE DATE:  10/22/2012 PROCEDURE:   Colonoscopy with biopsy ASA CLASS:   Class II INDICATIONS:unexplained diarrhea and hematochezia. MEDICATIONS: MAC sedation, administered by CRNA and propofol (Diprivan) 220mg  IV DESCRIPTION OF PROCEDURE:   After the risks benefits and alternatives of the procedure were thoroughly explained, informed consent was obtained.  A digital rectal exam revealed no abnormalities of the rectum.   The LB [id number]  endoscope was introduced through the anus and advanced to the terminal ileum which was intubated for a short distance. No adverse events experienced.   The quality of the prep was excellent, using MoviPrep  The instrument was then slowly withdrawn as the colon was fully examined.  COLON FINDINGS: A normal appearing cecum, ileocecal valve, and appendiceal orifice were identified.  The ascending, hepatic flexure, transverse, splenic flexure, descending, sigmoid colon and rectum appeared unremarkable.  No polyps or cancers were seen. Multiple random biopsies of the area were performed.   The mucosa appeared normal in the terminal ileum.  Retroflexed views revealed small internal hemorrhoids. The time to cecum=1 minutes 42 seconds. Withdrawal time=8 minutes 02 seconds.  The scope was withdrawn and the procedure completed.  COMPLICATIONS: There were no complications.  ENDOSCOPIC IMPRESSION: 1.   Normal colon; multiple random biopsies performed 2.   Small internal hemorrhoids  RECOMMENDATIONS: 1.   Await pathology results 2.   Treat for IBS and hemorrhoids   eSigned:  Meryl Dare, MD, [facility] 10/22/2012 1:51 PM

## 2012-10-22 NOTE — Patient Instructions (Addendum)
Discharge instructions given with verbal understanding. Biopsies taken. Resume previous medications. YOU HAD AN ENDOSCOPIC PROCEDURE TODAY AT THE [facility]: Refer to the procedure report that was given to you for any specific questions about what was found during the examination.  If the procedure report does not answer your questions, please call your gastroenterologist to clarify.  If you requested that your care partner not be given the details of your procedure findings, then the procedure report has been included in a sealed envelope for you to review at your convenience later.  YOU SHOULD EXPECT: Some feelings of bloating in the abdomen. Passage of more gas than usual.  Walking can help get rid of the air that was put into your GI tract during the procedure and reduce the bloating. If you had a lower endoscopy (such as a colonoscopy or flexible sigmoidoscopy) you may notice spotting of blood in your stool or on the toilet paper. If you underwent a bowel prep for your procedure, then you may not have a normal bowel movement for a few days.  DIET: Your first meal following the procedure should be a light meal and then it is ok to progress to your normal diet.  A half-sandwich or bowl of soup is an example of a good first meal.  Heavy or fried foods are harder to digest and may make you feel nauseous or bloated.  Likewise meals heavy in dairy and vegetables can cause extra gas to form and this can also increase the bloating.  Drink plenty of fluids but you should avoid alcoholic beverages for 24 hours.  ACTIVITY: Your care partner should take you home directly after the procedure.  You should plan to take it easy, moving slowly for the rest of the day.  You can resume normal activity the day after the procedure however you should NOT DRIVE or use heavy machinery for 24 hours (because of the sedation medicines used during the test).    SYMPTOMS TO REPORT IMMEDIATELY: A gastroenterologist  can be reached at any hour.  During normal business hours, 8:30 AM to 5:00 PM Monday through Friday, call [phone].  After hours and on weekends, please call the GI answering service at [phone] who will take a message and have the physician on call contact you.   Following lower endoscopy (colonoscopy or flexible sigmoidoscopy):  Excessive amounts of blood in the stool  Significant tenderness or worsening of abdominal pains  Swelling of the abdomen that is new, acute  Fever of 100F or higher  FOLLOW UP: If any biopsies were taken you will be contacted by phone or by letter within the next 1-3 weeks.  Call your gastroenterologist if you have not heard about the biopsies in 3 weeks.  Our staff will call the home number listed on your records the next business day following your procedure to check on you and address any questions or concerns that you may have at that time regarding the information given to you following your procedure. This is a courtesy call and so if there is no answer at the home number and we have not heard from you through the emergency physician on call, we will assume that you have returned to your regular daily activities without incident.  SIGNATURES/CONFIDENTIALITY: You and/or your care partner have signed paperwork which will be entered into your electronic medical record.  These signatures attest to the fact that that the information above on your After Visit Summary has been reviewed   and is understood.  Full responsibility of the confidentiality of this discharge information lies with you and/or your care-partner.  

## 2012-10-22 NOTE — Progress Notes (Signed)
Patient did not experience any of the following events: a burn prior to discharge; a fall within the facility; wrong site/side/patient/procedure/implant event; or a hospital transfer or hospital admission upon discharge from the facility. (G8907) Patient did not have preoperative order for IV antibiotic SSI prophylaxis. (G8918)  

## 2012-10-23 ENCOUNTER — Telehealth: Payer: Self-pay | Admitting: *Deleted

## 2012-10-23 NOTE — Telephone Encounter (Signed)
  Follow up Call-  Call back number 10/22/2012  Post procedure Call Back phone  # [phone]  Permission to leave phone message Yes     Patient questions:  Do you have a fever, pain , or abdominal swelling? yes Pain Score  1 *  Have you tolerated food without any problems? yes  Have you been able to return to your normal activities? yes  Do you have any questions about your discharge instructions: Diet   no Medications  no Follow up visit  no  Do you have questions or concerns about your Care? no  Actions: * If pain score is 4 or above: No action needed, pain <4.  Pt c/o still passing gas, and some cramping, advised to drink something warm and to move around to help relieve the rest of the air

## 2012-10-27 ENCOUNTER — Encounter: Payer: Self-pay | Admitting: Gastroenterology

## 2012-10-28 ENCOUNTER — Telehealth: Payer: Self-pay | Admitting: Gastroenterology

## 2012-10-28 ENCOUNTER — Encounter: Payer: Self-pay | Admitting: Family Medicine

## 2012-10-28 DIAGNOSIS — R1013 Epigastric pain: Secondary | ICD-10-CM

## 2012-10-28 NOTE — Telephone Encounter (Signed)
Patient notes that she is having continued pain, especially epigastric area.  Prevacid and hyoscyamine are not helping.  Please advise

## 2012-10-28 NOTE — Telephone Encounter (Signed)
Patient is scheduled for 11/02/12 8:00 [facility] she is advised to be NPO after midnight and arrive at 7:45 in radiology

## 2012-10-28 NOTE — Telephone Encounter (Signed)
Schedule Abd [place]

## 2012-10-28 NOTE — Telephone Encounter (Signed)
If 2 Levsin 30 min before meals and at bedtime is not helping change to glycopyrrolate 2 mg po bid (not prn) and use Levsin prn

## 2012-10-28 NOTE — Telephone Encounter (Signed)
Left message for patient to call back  

## 2012-11-02 ENCOUNTER — Ambulatory Visit (HOSPITAL_COMMUNITY)
Admission: RE | Admit: 2012-11-02 | Discharge: 2012-11-02 | Disposition: A | Discharge status: Home / Self Care | Payer: PRIVATE HEALTH INSURANCE | Source: Ambulatory Visit | Attending: Gastroenterology | Admitting: Gastroenterology

## 2012-11-02 DIAGNOSIS — R109 Unspecified abdominal pain: Secondary | ICD-10-CM | POA: Insufficient documentation

## 2012-11-02 DIAGNOSIS — R1013 Epigastric pain: Secondary | ICD-10-CM

## 2012-11-09 ENCOUNTER — Other Ambulatory Visit: Payer: Self-pay | Admitting: Family Medicine

## 2012-11-23 ENCOUNTER — Ambulatory Visit (INDEPENDENT_AMBULATORY_CARE_PROVIDER_SITE_OTHER): Payer: PRIVATE HEALTH INSURANCE | Admitting: *Deleted

## 2012-11-23 DIAGNOSIS — N809 Endometriosis, unspecified: Secondary | ICD-10-CM

## 2012-11-23 MED ORDER — DICLOFENAC SODIUM 75 MG PO TBEC: 75.0000 mg | DELAYED_RELEASE_TABLET | Freq: Two times a day (BID) | ORAL | Status: DC

## 2012-11-23 MED ORDER — LEUPROLIDE ACETATE (3 MONTH) 11.25 MG IM KIT
11.2500 mg | PACK | Freq: Once | INTRAMUSCULAR | Status: AC
  Administered 2012-11-23: 11.25 mg via INTRAMUSCULAR

## 2012-11-23 NOTE — Addendum Note (Signed)
Addended by: Barbara Cower on: 11/23/2012 11:58 AM   Modules accepted: Orders

## 2012-12-03 ENCOUNTER — Telehealth: Payer: Self-pay

## 2012-12-03 MED ORDER — DICYCLOMINE HCL 20 MG PO TABS: 20.0000 mg | ORAL_TABLET | Freq: Three times a day (TID) | ORAL | Status: DC

## 2012-12-03 MED ORDER — ALIGN 4 MG PO CAPS: 1.0000 | ORAL_CAPSULE | Freq: Every day | ORAL | Status: DC

## 2012-12-03 NOTE — Telephone Encounter (Signed)
I spoke with patient. Levsin didn't help. Loose bowels. Will keep appt w me.

## 2012-12-03 NOTE — Telephone Encounter (Signed)
She was prescribed levsin by Dr. Russella Dar in March - did this help pain? For possible IBS - I recommend starting align - may pick up samples and then buy OTC if helping.   Could also try levsin with meals (although this is similar to levsin).   Keep appt with me.

## 2012-12-03 NOTE — Telephone Encounter (Signed)
Could you please clarify what she could try prior to meals? Did you mean Bentyl?

## 2012-12-03 NOTE — Telephone Encounter (Signed)
Pt said Omeprazole is not helping upper stomach pain now; on and off episodes (at least one pain episode daily) of upper abd pain with nausea. No vomiting or fever. Pt said 80 % of BMs are very loose. 12 - 2 pm severe upper abd pain (pain level when in pain 8; now pain level is 4). Pt said Dr Russella Dar told pt colonoscopy and [place] of abd were OK and questioned IBS. Pt eating appropriate diet and drinking a lot of water, no sodas. Pt has not called Dr Russella Dar this time; wanted Dr Sharen Hones opinion. Pt scheduled appt on 12/08/12 with Dr Sharen Hones (1st available appt convenient for pt). [facility]. Advised pt if condition changes or worsens to call back. Pt request call back.

## 2012-12-08 ENCOUNTER — Ambulatory Visit: Payer: PRIVATE HEALTH INSURANCE | Admitting: Family Medicine

## 2012-12-08 DIAGNOSIS — Z0289 Encounter for other administrative examinations: Secondary | ICD-10-CM

## 2013-01-08 ENCOUNTER — Other Ambulatory Visit: Payer: Self-pay | Admitting: Family Medicine

## 2013-02-09 ENCOUNTER — Telehealth: Payer: Self-pay

## 2013-02-09 NOTE — Telephone Encounter (Signed)
plz notify - this is a viral illness that is contagious like the common cold.  Would likely present with cold sxs - fever, sore throat or mouth, possible congestion.  May also have typical rash but not necessary. If becomes ill, should improve with time and no specific treatment - usually self- limited illness. Best prevention is frequent hand washing.

## 2013-02-09 NOTE — Telephone Encounter (Signed)
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 Melton Krebs was last in office on 02/04/13; Gentleman's child was diagnosed the week before hand,foot and mouth disease. Mrs Carmela Hurt is concerned about getting hand, foot and mouth disease. Pt said gentleman that was dx hugged her on 02/04/13. Pt does not have any rash,fever or other symptoms. Pt wants to know what symptoms she should look for and is there any thing she can do to prevent this. [facility]. Pt request cb.

## 2013-02-09 NOTE — Telephone Encounter (Signed)
Patient notified

## 2013-02-25 ENCOUNTER — Ambulatory Visit (INDEPENDENT_AMBULATORY_CARE_PROVIDER_SITE_OTHER): Payer: PRIVATE HEALTH INSURANCE | Admitting: *Deleted

## 2013-02-25 DIAGNOSIS — N809 Endometriosis, unspecified: Secondary | ICD-10-CM

## 2013-02-25 MED ORDER — LEUPROLIDE ACETATE (3 MONTH) 11.25 MG IM KIT
11.2500 mg | PACK | Freq: Once | INTRAMUSCULAR | Status: AC
  Administered 2013-02-25: 11.25 mg via INTRAMUSCULAR

## 2013-03-13 ENCOUNTER — Emergency Department: Payer: Self-pay | Admitting: Emergency Medicine

## 2013-03-13 LAB — BASIC METABOLIC PANEL
Anion Gap: 7 (ref 7–16)
Calcium, Total: 8.7 mg/dL (ref 8.5–10.1)
Chloride: 107 mmol/L (ref 98–107)
Creatinine: 0.8 mg/dL (ref 0.60–1.30)
EGFR (Non-African Amer.): >60
Glucose: 97 mg/dL (ref 65–99)
Osmolality: 278 (ref 275–301)
Potassium: 3.7 mmol/L (ref 3.5–5.1)
Sodium: 140 mmol/L (ref 136–145)

## 2013-03-13 LAB — CBC
HCT: 43.1 % (ref 35.0–47.0)
HGB: 15.1 g/dL (ref 12.0–16.0)
MCHC: 35.1 g/dL (ref 32.0–36.0)
MCV: 91 fL (ref 80–100)
Platelet: 307 10*3/uL (ref 150–440)
RBC: 4.76 10*6/uL (ref 3.80–5.20)
RDW: 12.3 % (ref 11.5–14.5)
WBC: 7.1 10*3/uL (ref 3.6–11.0)

## 2013-03-13 LAB — TSH: Thyroid Stimulating Horm: 2.71 u[IU]/mL

## 2013-03-29 DIAGNOSIS — I890 Lymphedema, not elsewhere classified: Secondary | ICD-10-CM | POA: Insufficient documentation

## 2013-03-29 DIAGNOSIS — F329 Major depressive disorder, single episode, unspecified: Secondary | ICD-10-CM | POA: Insufficient documentation

## 2013-05-24 ENCOUNTER — Other Ambulatory Visit: Payer: Self-pay | Admitting: *Deleted

## 2013-05-24 DIAGNOSIS — E039 Hypothyroidism, unspecified: Secondary | ICD-10-CM

## 2013-05-24 DIAGNOSIS — F1120 Opioid dependence, uncomplicated: Secondary | ICD-10-CM | POA: Insufficient documentation

## 2013-05-24 MED ORDER — LEVOTHYROXINE SODIUM 88 MCG PO TABS: 88.0000 ug | ORAL_TABLET | Freq: Every day | ORAL | Status: DC

## 2013-05-24 NOTE — Telephone Encounter (Signed)
Lab Results  Component Value Date   TSH 2.84 10/07/2012  sent 90 d supply with 1 refill. plz have her come in for lab visit to check TSH.

## 2013-05-24 NOTE — Telephone Encounter (Signed)
Patient called and asked if she needed to have labs done for her thyroid. She said it's been awhile since they've been checked and she was just checking. She isn't having any problems or symptoms but was just checking because she needs a med refill. She would like a 90 day supply on med if possible.

## 2013-05-25 NOTE — Telephone Encounter (Signed)
Patient notified and lab appt scheduled.  

## 2013-05-27 ENCOUNTER — Ambulatory Visit: Payer: PRIVATE HEALTH INSURANCE | Admitting: *Deleted

## 2013-05-27 ENCOUNTER — Other Ambulatory Visit (INDEPENDENT_AMBULATORY_CARE_PROVIDER_SITE_OTHER): Payer: PRIVATE HEALTH INSURANCE

## 2013-05-27 DIAGNOSIS — N809 Endometriosis, unspecified: Secondary | ICD-10-CM

## 2013-05-27 DIAGNOSIS — E039 Hypothyroidism, unspecified: Secondary | ICD-10-CM

## 2013-05-27 MED ORDER — LEUPROLIDE ACETATE (3 MONTH) 11.25 MG IM KIT
11.2500 mg | PACK | Freq: Once | INTRAMUSCULAR | Status: AC
  Administered 2013-05-27: 11.25 mg via INTRAMUSCULAR

## 2013-05-28 ENCOUNTER — Encounter: Payer: Self-pay | Admitting: *Deleted

## 2013-07-16 ENCOUNTER — Other Ambulatory Visit: Payer: Self-pay | Admitting: Family Medicine

## 2013-08-23 ENCOUNTER — Encounter: Payer: Self-pay | Admitting: *Deleted

## 2013-08-23 ENCOUNTER — Ambulatory Visit (INDEPENDENT_AMBULATORY_CARE_PROVIDER_SITE_OTHER): Payer: PRIVATE HEALTH INSURANCE | Admitting: *Deleted

## 2013-08-23 DIAGNOSIS — N809 Endometriosis, unspecified: Secondary | ICD-10-CM

## 2013-08-23 MED ORDER — LEUPROLIDE ACETATE (3 MONTH) 11.25 MG IM KIT
11.2500 mg | PACK | Freq: Once | INTRAMUSCULAR | Status: AC
Start: 2013-08-23 — End: 2013-08-23
  Administered 2013-08-23: 11.25 mg via INTRAMUSCULAR

## 2013-08-23 NOTE — Progress Notes (Signed)
Pt here today for her Depo Lupron

## 2013-09-21 ENCOUNTER — Ambulatory Visit (INDEPENDENT_AMBULATORY_CARE_PROVIDER_SITE_OTHER): Payer: PRIVATE HEALTH INSURANCE | Admitting: Family Medicine

## 2013-09-21 ENCOUNTER — Encounter: Payer: Self-pay | Admitting: Family Medicine

## 2013-09-21 VITALS — BP 133/75 | HR 63 | Ht 64.0 in | Wt 209.0 lb

## 2013-09-21 DIAGNOSIS — Z8742 Personal history of other diseases of the female genital tract: Secondary | ICD-10-CM

## 2013-09-21 DIAGNOSIS — Z124 Encounter for screening for malignant neoplasm of cervix: Secondary | ICD-10-CM

## 2013-09-21 DIAGNOSIS — Z01419 Encounter for gynecological examination (general) (routine) without abnormal findings: Secondary | ICD-10-CM

## 2013-09-21 MED ORDER — NORETHINDRONE ACETATE 5 MG PO TABS: 5.0000 mg | ORAL_TABLET | Freq: Every day | ORAL | Status: DC

## 2013-09-21 NOTE — Progress Notes (Signed)
Here today for gyn physical, no other concerns.

## 2013-09-21 NOTE — Patient Instructions (Signed)
Menopause Menopause is the normal time of life when menstrual periods stop completely. Menopause is complete when you have missed 12 consecutive menstrual periods. It usually occurs between the ages of 66 years and 71 years. Very rarely does a woman develop menopause before the age of 21 years. At menopause, your ovaries stop producing the female hormones estrogen and progesterone. This can cause undesirable symptoms and also affect your health. Sometimes the symptoms may occur 4 5 years before the menopause begins. There is no relationship between menopause and:  Oral contraceptives.  Number of children you had.  Race.  The age your menstrual periods started (menarche). Heavy smokers and very thin women may develop menopause earlier in life. CAUSES  The ovaries stop producing the female hormones estrogen and progesterone.  Other causes include:  Surgery to remove both ovaries.  The ovaries stop functioning for no known reason.  Tumors of the pituitary gland in the brain.  Medical disease that affects the ovaries and hormone production.  Radiation treatment to the abdomen or pelvis.  Chemotherapy that affects the ovaries. SYMPTOMS   Hot flashes.  Night sweats.  Decrease in sex drive.  Vaginal dryness and thinning of the vagina causing painful intercourse.  Dryness of the skin and developing wrinkles.  Headaches.  Tiredness.  Irritability.  Memory problems.  Weight gain.  Bladder infections.  Hair growth of the face and chest.  Infertility. More serious symptoms include:  Loss of bone (osteoporosis) causing breaks (fractures).  Depression.  Hardening and narrowing of the arteries (atherosclerosis) causing heart attacks and strokes. DIAGNOSIS   When the menstrual periods have stopped for 12 straight months.  Physical exam.  Hormone studies of the blood. TREATMENT  There are many treatment choices and nearly as many questions about them. The  decisions to treat or not to treat menopausal changes is an individual choice made with your health care provider. Your health care provider can discuss the treatments with you. Together, you can decide which treatment will work best for you. Your treatment choices may include:   Hormone therapy (estrogen and progesterone).  Non-hormonal medicines.  Treating the individual symptoms with medicine (for example antidepressants for depression).  Herbal medicines that may help specific symptoms.  Counseling by a psychiatrist or psychologist.  Group therapy.  Lifestyle changes including:  Eating healthy.  Regular exercise.  Limiting caffeine and alcohol.  Stress management and meditation.  No treatment. HOME CARE INSTRUCTIONS   Take the medicine your health care provider gives you as directed.  Get plenty of sleep and rest.  Exercise regularly.  Eat a diet that contains calcium (good for the bones) and soy products (acts like estrogen hormone).  Avoid alcoholic beverages.  Do not smoke.  If you have hot flashes, dress in layers.  Take supplements, calcium, and vitamin D to strengthen bones.  You can use over-the-counter lubricants or moisturizers for vaginal dryness.  Group therapy is sometimes very helpful.  Acupuncture may be helpful in some cases. SEEK MEDICAL CARE IF:   You are not sure you are in menopause.  You are having menopausal symptoms and need advice and treatment.  You are still having menstrual periods after age 53 years.  You have pain with intercourse.  Menopause is complete (no menstrual period for 12 months) and you develop vaginal bleeding.  You need a referral to a specialist (gynecologist, psychiatrist, or psychologist) for treatment. SEEK IMMEDIATE MEDICAL CARE IF:   You have severe depression.  You have excessive vaginal bleeding.  You fell and think you have a broken bone.  You have pain when you urinate.  You develop leg or  chest pain.  You have a fast pounding heart beat (palpitations).  You have severe headaches.  You develop vision problems.  You feel a lump in your breast.  You have abdominal pain or severe indigestion. Document Released: 09/21/2003 Document Revised: 03/03/2013 Document Reviewed: 01/28/2013  [facility] Patient Information 2014 [place], [place]. Endometriosis Endometriosis is a condition in which the tissue that lines the uterus (endometrium) grows outside of its normal location. The tissue may grow in many locations close to the uterus, but it commonly grows on the ovaries, fallopian tubes, vagina, or bowel. Because the uterus expels, or sheds, its lining every menstrual cycle, there is bleeding wherever the endometrial tissue is located. This can cause pain because blood is irritating to tissues not normally exposed to it.  CAUSES  The cause of endometriosis is not known.  SIGNS AND SYMPTOMS  Often, there are no symptoms. When symptoms are present, they can vary with the location of the displaced tissue. Various symptoms can occur at different times. Although symptoms occur mainly during a woman's menstrual period, they can also occur midcycle and usually stop with menopause. Some people may go months with no symptoms at all. Symptoms may include:   Back or abdominal pain.   Heavier bleeding during periods.   Pain during intercourse.   Painful bowel movements.   Infertility. DIAGNOSIS  Your health care provider will do a physical exam and ask about your symptoms. Various tests may be done, such as:   Blood tests and urine tests. These are done to help rule out other problems.   Ultrasound. This test is done to look for abnormal tissue.   An X-ray of the lower bowel (barium enema).  Laparoscopy. In this procedure, a thin, lighted tube with a tiny camera on the end (laparoscope) is inserted into your abdomen. This helps your health care provider look for abnormal tissue to  confirm the diagnosis. The health care provider may also remove a small piece of tissue (biopsy) from any abnormal tissue found. This tissue sample can then be sent to a lab so it can be looked at under a microscope. TREATMENT  Treatment will vary and may include:   Medicines to relieve pain. Nonsteroidal anti-inflammatory drugs (NSAIDs) are a type of pain medicine that can help to relieve the pain caused by endometriosis.  Hormonal therapy. When using hormonal therapy, periods are eliminated. This eliminates the monthly exposure to blood by the displaced endometrial tissue.   Surgery. Surgery may sometimes be done to remove the abnormal endometrial tissue. In severe cases, surgery may be done to remove the fallopian tubes, uterus, and ovaries (hysterectomy). HOME CARE INSTRUCTIONS   Only take over-the-counter or prescription medicines for pain, discomfort, or fever as directed by your health care provider. Do not take aspirin because it may increase bleeding when you are not on hormonal therapy.   Avoid activities that produce pain, including sexual activity. SEEK MEDICAL CARE IF:  You have pelvic pain before, after, or during your periods.  You have pelvic pain between periods that gets worse during your period.  You have pelvic pain during or after sex.  You have pelvic pain with bowel movements or urination, especially during your period.  You have problems getting pregnant. SEEK IMMEDIATE MEDICAL CARE IF:   Your pain is severe and is not responding to pain medicine.   You have  severe nausea and vomiting, or you cannot keep foods down.   You have pain that is limited to the right lower part of your abdomen.   You have swelling or increasing pain in your abdomen.   You see blood in your stool.   You have a fever or persistent symptoms for more than 2 3 days.   You have a fever and your symptoms suddenly get worse. MAKE SURE YOU:   Understand these  instructions.  Will watch your condition.  Will get help right away if you are not doing well or get worse. Document Released: 06/28/2000 Document Revised: 04/21/2013 Document Reviewed: 02/26/2013 [facility] Patient Information 2014 [place].   Preventive Care for Adults, Female A healthy lifestyle and preventive care can promote health and wellness. Preventive health guidelines for women include the following key practices.  A routine yearly physical is a good way to check with your health care provider about your health and preventive screening. It is a chance to share any concerns and updates on your health and to receive a thorough exam.  Visit your dentist for a routine exam and preventive care every 6 months. Brush your teeth twice a day and floss once a day. Good oral hygiene prevents tooth decay and gum disease.  The frequency of eye exams is based on your age, health, family medical history, use of contact lenses, and other factors. Follow your health care provider's recommendations for frequency of eye exams.  Eat a healthy diet. Foods like vegetables, fruits, whole grains, low-fat dairy products, and lean protein foods contain the nutrients you need without too many calories. Decrease your intake of foods high in solid fats, added sugars, and salt. Eat the right amount of calories for you.Get information about a proper diet from your health care provider, if necessary.  Regular physical exercise is one of the most important things you can do for your health. Most adults should get at least 150 minutes of moderate-intensity exercise (any activity that increases your heart rate and causes you to sweat) each week. In addition, most adults need muscle-strengthening exercises on 2 or more days a week.  Maintain a healthy weight. The body mass index (BMI) is a screening tool to identify possible weight problems. It provides an estimate of body fat based on height and weight. Your  health care provider can find your BMI, and can help you achieve or maintain a healthy weight.For adults 20 years and older:  A BMI below 18.5 is considered underweight.  A BMI of 18.5 to 24.9 is normal.  A BMI of 25 to 29.9 is considered overweight.  A BMI of 30 and above is considered obese.  Maintain normal blood lipids and cholesterol levels by exercising and minimizing your intake of saturated fat. Eat a balanced diet with plenty of fruit and vegetables. Blood tests for lipids and cholesterol should begin at age 60 and be repeated every 5 years. If your lipid or cholesterol levels are high, you are over 50, or you are at high risk for heart disease, you may need your cholesterol levels checked more frequently.Ongoing high lipid and cholesterol levels should be treated with medicines if diet and exercise are not working.  If you smoke, find out from your health care provider how to quit. If you do not use tobacco, do not start.  Lung cancer screening is recommended for adults aged 14 80 years who are at high risk for developing lung cancer because of a history of  smoking. A yearly low-dose CT scan of the lungs is recommended for people who have at least a 30-pack-year history of smoking and are a current smoker or have quit within the past 15 years. A pack year of smoking is smoking an average of 1 pack of cigarettes a day for 1 year (for example: 1 pack a day for 30 years or 2 packs a day for 15 years). Yearly screening should continue until the smoker has stopped smoking for at least 15 years. Yearly screening should be stopped for people who develop a health problem that would prevent them from having lung cancer treatment.  If you are pregnant, do not drink alcohol. If you are breastfeeding, be very cautious about drinking alcohol. If you are not pregnant and choose to drink alcohol, do not have more than 1 drink per day. One drink is considered to be 12 ounces (355 mL) of beer, 5 ounces  (148 mL) of wine, or 1.5 ounces (44 mL) of liquor.  Avoid use of street drugs. Do not share needles with anyone. Ask for help if you need support or instructions about stopping the use of drugs.  High blood pressure causes heart disease and increases the risk of stroke. Your blood pressure should be checked at least every 1 to 2 years. Ongoing high blood pressure should be treated with medicines if weight loss and exercise do not work.  If you are 78 39 years old, ask your health care provider if you should take aspirin to prevent strokes.  Diabetes screening involves taking a blood sample to check your fasting blood sugar level. This should be done once every 3 years, after age 3, if you are within normal weight and without risk factors for diabetes. Testing should be considered at a younger age or be carried out more frequently if you are overweight and have at least 1 risk factor for diabetes.  Breast cancer screening is essential preventive care for women. You should practice "breast self-awareness." This means understanding the normal appearance and feel of your breasts and may include breast self-examination. Any changes detected, no matter how small, should be reported to a health care provider. Women in their 50s and 30s should have a clinical breast exam (CBE) by a health care provider as part of a regular health exam every 1 to 3 years. After age 68, women should have a CBE every year. Starting at age 71, women should consider having a mammogram (breast X-ray test) every year. Women who have a family history of breast cancer should talk to their health care provider about genetic screening. Women at a high risk of breast cancer should talk to their health care providers about having an MRI and a mammogram every year.  Breast cancer gene (BRCA)-related cancer risk assessment is recommended for women who have family members with BRCA-related cancers. BRCA-related cancers include breast, ovarian,  tubal, and peritoneal cancers. Having family members with these cancers may be associated with an increased risk for harmful changes (mutations) in the breast cancer genes BRCA1 and BRCA2. Results of the assessment will determine the need for genetic counseling and BRCA1 and BRCA2 testing.  The Pap test is a screening test for cervical cancer. A Pap test can show cell changes on the cervix that might become cervical cancer if left untreated. A Pap test is a procedure in which cells are obtained and examined from the lower end of the uterus (cervix).  Women should have a Pap test starting at  age 40.  Between ages 19 and 30, Pap tests should be repeated every 2 years.  Beginning at age 65, you should have a Pap test every 3 years as long as the past 3 Pap tests have been normal.  Some women have medical problems that increase the chance of getting cervical cancer. Talk to your health care provider about these problems. It is especially important to talk to your health care provider if a new problem develops soon after your last Pap test. In these cases, your health care provider may recommend more frequent screening and Pap tests.  The above recommendations are the same for women who have or have not gotten the vaccine for human papillomavirus (HPV).  If you had a hysterectomy for a problem that was not cancer or a condition that could lead to cancer, then you no longer need Pap tests. Even if you no longer need a Pap test, a regular exam is a good idea to make sure no other problems are starting.  If you are between ages 1 and 22 years, and you have had normal Pap tests going back 10 years, you no longer need Pap tests. Even if you no longer need a Pap test, a regular exam is a good idea to make sure no other problems are starting.  If you have had past treatment for cervical cancer or a condition that could lead to cancer, you need Pap tests and screening for cancer for at least 20 years after  your treatment.  If Pap tests have been discontinued, risk factors (such as a new sexual partner) need to be reassessed to determine if screening should be resumed.  The HPV test is an additional test that may be used for cervical cancer screening. The HPV test looks for the virus that can cause the cell changes on the cervix. The cells collected during the Pap test can be tested for HPV. The HPV test could be used to screen women aged 96 years and older, and should be used in women of any age who have unclear Pap test results. After the age of 80, women should have HPV testing at the same frequency as a Pap test.  Colorectal cancer can be detected and often prevented. Most routine colorectal cancer screening begins at the age of 84 years and continues through age 46 years. However, your health care provider may recommend screening at an earlier age if you have risk factors for colon cancer. On a yearly basis, your health care provider may provide home test kits to check for hidden blood in the stool. Use of a small camera at the end of a tube, to directly examine the colon (sigmoidoscopy or colonoscopy), can detect the earliest forms of colorectal cancer. Talk to your health care provider about this at age 64, when routine screening begins. Direct exam of the colon should be repeated every 5 10 years through age 29 years, unless early forms of pre-cancerous polyps or small growths are found.  People who are at an increased risk for hepatitis B should be screened for this virus. You are considered at high risk for hepatitis B if:  You were born in a country where hepatitis B occurs often. Talk with your health care provider about which countries are considered high risk.  Your parents were born in a high-risk country and you have not received a shot to protect against hepatitis B (hepatitis B vaccine).  You have HIV or AIDS.  You use needles  to inject street drugs.  You live with, or have sex  with, someone who has Hepatitis B.  You get hemodialysis treatment.  You take certain medicines for conditions like cancer, organ transplantation, and autoimmune conditions.  Hepatitis C blood testing is recommended for all people born from [birth year] through 1966 and any individual with known risks for hepatitis C.  Practice safe sex. Use condoms and avoid high-risk sexual practices to reduce the spread of sexually transmitted infections (STIs). STIs include gonorrhea, chlamydia, syphilis, trichomonas, herpes, HPV, and human immunodeficiency virus (HIV). Herpes, HIV, and HPV are viral illnesses that have no cure. They can result in disability, cancer, and death. Sexually active women aged 78 years and younger should be checked for chlamydia. Older women with new or multiple partners should also be tested for chlamydia. Testing for other STIs is recommended if you are sexually active and at increased risk.  Osteoporosis is a disease in which the bones lose minerals and strength with aging. This can result in serious bone fractures or breaks. The risk of osteoporosis can be identified using a bone density scan. Women ages 81 years and over and women at risk for fractures or osteoporosis should discuss screening with their health care providers. Ask your health care provider whether you should take a calcium supplement or vitamin D to reduce the rate of osteoporosis.  Menopause can be associated with physical symptoms and risks. Hormone replacement therapy is available to decrease symptoms and risks. You should talk to your health care provider about whether hormone replacement therapy is right for you.  Use sunscreen. Apply sunscreen liberally and repeatedly throughout the day. You should seek shade when your shadow is shorter than you. Protect yourself by wearing long sleeves, pants, a wide-brimmed hat, and sunglasses year round, whenever you are outdoors.  Once a month, do a whole body skin exam, using a  mirror to look at the skin on your back. Tell your health care provider of new moles, moles that have irregular borders, moles that are larger than a pencil eraser, or moles that have changed in shape or color.  Stay current with required vaccines (immunizations).  Influenza vaccine. All adults should be immunized every year.  Tetanus, diphtheria, and acellular pertussis (Td, Tdap) vaccine. Pregnant women should receive 1 dose of Tdap vaccine during each pregnancy. The dose should be obtained regardless of the length of time since the last dose. Immunization is preferred during the 27th 36th week of gestation. An adult who has not previously received Tdap or who does not know her vaccine status should receive 1 dose of Tdap. This initial dose should be followed by tetanus and diphtheria toxoids (Td) booster doses every 10 years. Adults with an unknown or incomplete history of completing a 3-dose immunization series with Td-containing vaccines should begin or complete a primary immunization series including a Tdap dose. Adults should receive a Td booster every 10 years.  Varicella vaccine. An adult without evidence of immunity to varicella should receive 2 doses or a second dose if she has previously received 1 dose. Pregnant females who do not have evidence of immunity should receive the first dose after pregnancy. This first dose should be obtained before leaving the health care facility. The second dose should be obtained 4 8 weeks after the first dose.  Human papillomavirus (HPV) vaccine. Females aged 80 26 years who have not received the vaccine previously should obtain the 3-dose series. The vaccine is not recommended for use in pregnant females.  However, pregnancy testing is not needed before receiving a dose. If a female is found to be pregnant after receiving a dose, no treatment is needed. In that case, the remaining doses should be delayed until after the pregnancy. Immunization is recommended  for any person with an immunocompromised condition through the age of 30 years if she did not get any or all doses earlier. During the 3-dose series, the second dose should be obtained 4 8 weeks after the first dose. The third dose should be obtained 24 weeks after the first dose and 16 weeks after the second dose.  Zoster vaccine. One dose is recommended for adults aged 54 years or older unless certain conditions are present.  Measles, mumps, and rubella (MMR) vaccine. Adults born before 65 generally are considered immune to measles and mumps. Adults born in [birth year] or later should have 1 or more doses of MMR vaccine unless there is a contraindication to the vaccine or there is laboratory evidence of immunity to each of the three diseases. A routine second dose of MMR vaccine should be obtained at least 28 days after the first dose for students attending postsecondary schools, health care workers, or international travelers. People who received inactivated measles vaccine or an unknown type of measles vaccine during 1963 1967 should receive 2 doses of MMR vaccine. People who received inactivated mumps vaccine or an unknown type of mumps vaccine before 1979 and are at high risk for mumps infection should consider immunization with 2 doses of MMR vaccine. For females of childbearing age, rubella immunity should be determined. If there is no evidence of immunity, females who are not pregnant should be vaccinated. If there is no evidence of immunity, females who are pregnant should delay immunization until after pregnancy. Unvaccinated health care workers born before 2 who lack laboratory evidence of measles, mumps, or rubella immunity or laboratory confirmation of disease should consider measles and mumps immunization with 2 doses of MMR vaccine or rubella immunization with 1 dose of MMR vaccine.  Pneumococcal 13-valent conjugate (PCV13) vaccine. When indicated, a person who is uncertain of her immunization  history and has no record of immunization should receive the PCV13 vaccine. An adult aged 50 years or older who has certain medical conditions and has not been previously immunized should receive 1 dose of PCV13 vaccine. This PCV13 should be followed with a dose of pneumococcal polysaccharide (PPSV23) vaccine. The PPSV23 vaccine dose should be obtained at least 8 weeks after the dose of PCV13 vaccine. An adult aged 31 years or older who has certain medical conditions and previously received 1 or more doses of PPSV23 vaccine should receive 1 dose of PCV13. The PCV13 vaccine dose should be obtained 1 or more years after the last PPSV23 vaccine dose.  Pneumococcal polysaccharide (PPSV23) vaccine. When PCV13 is also indicated, PCV13 should be obtained first. All adults aged 65 years and older should be immunized. An adult younger than age 101 years who has certain medical conditions should be immunized. Any person who resides in a nursing home or long-term care facility should be immunized. An adult smoker should be immunized. People with an immunocompromised condition and certain other conditions should receive both PCV13 and PPSV23 vaccines. People with human immunodeficiency virus (HIV) infection should be immunized as soon as possible after diagnosis. Immunization during chemotherapy or radiation therapy should be avoided. Routine use of PPSV23 vaccine is not recommended for American Indians, [place] Natives, or people younger than 65 years unless there are medical conditions  that require PPSV23 vaccine. When indicated, people who have unknown immunization and have no record of immunization should receive PPSV23 vaccine. One-time revaccination 5 years after the first dose of PPSV23 is recommended for people aged 51 64 years who have chronic kidney failure, nephrotic syndrome, asplenia, or immunocompromised conditions. People who received 1 2 doses of PPSV23 before age 32 years should receive another dose of PPSV23  vaccine at age 77 years or later if at least 5 years have passed since the previous dose. Doses of PPSV23 are not needed for people immunized with PPSV23 at or after age 40 years.  Meningococcal vaccine. Adults with asplenia or persistent complement component deficiencies should receive 2 doses of quadrivalent meningococcal conjugate (MenACWY-D) vaccine. The doses should be obtained at least 2 months apart. Microbiologists working with certain meningococcal bacteria, [employer] recruits, people at risk during an outbreak, and people who travel to or live in countries with a high rate of meningitis should be immunized. A first-year college student up through age 26 years who is living in a residence hall should receive a dose if she did not receive a dose on or after her 16th birthday. Adults who have certain high-risk conditions should receive one or more doses of vaccine.  Hepatitis A vaccine. Adults who wish to be protected from this disease, have certain high-risk conditions, work with hepatitis A-infected animals, work in hepatitis A research labs, or travel to or work in countries with a high rate of hepatitis A should be immunized. Adults who were previously unvaccinated and who anticipate close contact with an international adoptee during the first 60 days after arrival in the [place] from a country with a high rate of hepatitis A should be immunized.  Hepatitis B vaccine. Adults who wish to be protected from this disease, have certain high-risk conditions, may be exposed to blood or other infectious body fluids, are household contacts or sex partners of hepatitis B positive people, are clients or workers in certain care facilities, or travel to or work in countries with a high rate of hepatitis B should be immunized.  Haemophilus influenzae type b (Hib) vaccine. A previously unvaccinated person with asplenia or sickle cell disease or having a scheduled splenectomy should receive 1 dose of Hib  vaccine. Regardless of previous immunization, a recipient of a hematopoietic stem cell transplant should receive a 3-dose series 6 12 months after her successful transplant. Hib vaccine is not recommended for adults with HIV infection. Preventive Services / Frequency Ages 68 to 39years  Blood pressure check.** / Every 1 to 2 years.  Lipid and cholesterol check.** / Every 5 years beginning at age 50.  Clinical breast exam.** / Every 3 years for women in their 101s and 65s.  BRCA-related cancer risk assessment.** / For women who have family members with a BRCA-related cancer (breast, ovarian, tubal, or peritoneal cancers).  Pap test.** / Every 2 years from ages 26 through 95. Every 3 years starting at age 24 through age 64 or 7 with a history of 3 consecutive normal Pap tests.  HPV screening.** / Every 3 years from ages 88 through ages 41 to 36 with a history of 3 consecutive normal Pap tests.  Hepatitis C blood test.** / For any individual with known risks for hepatitis C.  Skin self-exam. / Monthly.  Influenza vaccine. / Every year.  Tetanus, diphtheria, and acellular pertussis (Tdap, Td) vaccine.** / Consult your health care provider. Pregnant women should receive 1 dose of Tdap vaccine during  each pregnancy. 1 dose of Td every 10 years.  Varicella vaccine.** / Consult your health care provider. Pregnant females who do not have evidence of immunity should receive the first dose after pregnancy.  HPV vaccine. / 3 doses over 6 months, if 29 and younger. The vaccine is not recommended for use in pregnant females. However, pregnancy testing is not needed before receiving a dose.  Measles, mumps, rubella (MMR) vaccine.** / You need at least 1 dose of MMR if you were born in 1957 or later. You may also need a 2nd dose. For females of childbearing age, rubella immunity should be determined. If there is no evidence of immunity, females who are not pregnant should be vaccinated. If there is no  evidence of immunity, females who are pregnant should delay immunization until after pregnancy.  Pneumococcal 13-valent conjugate (PCV13) vaccine.** / Consult your health care provider.  Pneumococcal polysaccharide (PPSV23) vaccine.** / 1 to 2 doses if you smoke cigarettes or if you have certain conditions.  Meningococcal vaccine.** / 1 dose if you are age 36 to 35 years and a Market researcher living in a residence hall, or have one of several medical conditions, you need to get vaccinated against meningococcal disease. You may also need additional booster doses.  Hepatitis A vaccine.** / Consult your health care provider.  Hepatitis B vaccine.** / Consult your health care provider.  Haemophilus influenzae type b (Hib) vaccine.** / Consult your health care provider. Ages 29 to 64years  Blood pressure check.** / Every 1 to 2 years.  Lipid and cholesterol check.** / Every 5 years beginning at age 92 years.  Lung cancer screening. / Every year if you are aged 74 80 years and have a 30-pack-year history of smoking and currently smoke or have quit within the past 15 years. Yearly screening is stopped once you have quit smoking for at least 15 years or develop a health problem that would prevent you from having lung cancer treatment.  Clinical breast exam.** / Every year after age 83 years.  BRCA-related cancer risk assessment.** / For women who have family members with a BRCA-related cancer (breast, ovarian, tubal, or peritoneal cancers).  Mammogram.** / Every year beginning at age 95 years and continuing for as long as you are in good health. Consult with your health care provider.  Pap test.** / Every 3 years starting at age 43 years through age 41 or 20 years with a history of 3 consecutive normal Pap tests.  HPV screening.** / Every 3 years from ages 32 years through ages 74 to 78 years with a history of 3 consecutive normal Pap tests.  Fecal occult blood test (FOBT) of  stool. / Every year beginning at age 52 years and continuing until age 65 years. You may not need to do this test if you get a colonoscopy every 10 years.  Flexible sigmoidoscopy or colonoscopy.** / Every 5 years for a flexible sigmoidoscopy or every 10 years for a colonoscopy beginning at age 26 years and continuing until age 46 years.  Hepatitis C blood test.** / For all people born from [birth year] through 1966 and any individual with known risks for hepatitis C.  Skin self-exam. / Monthly.  Influenza vaccine. / Every year.  Tetanus, diphtheria, and acellular pertussis (Tdap/Td) vaccine.** / Consult your health care provider. Pregnant women should receive 1 dose of Tdap vaccine during each pregnancy. 1 dose of Td every 10 years.  Varicella vaccine.** / Consult your health care provider. Pregnant females  who do not have evidence of immunity should receive the first dose after pregnancy.  Zoster vaccine.** / 1 dose for adults aged 77 years or older.  Measles, mumps, rubella (MMR) vaccine.** / You need at least 1 dose of MMR if you were born in 1957 or later. You may also need a 2nd dose. For females of childbearing age, rubella immunity should be determined. If there is no evidence of immunity, females who are not pregnant should be vaccinated. If there is no evidence of immunity, females who are pregnant should delay immunization until after pregnancy.  Pneumococcal 13-valent conjugate (PCV13) vaccine.** / Consult your health care provider.  Pneumococcal polysaccharide (PPSV23) vaccine.** / 1 to 2 doses if you smoke cigarettes or if you have certain conditions.  Meningococcal vaccine.** / Consult your health care provider.  Hepatitis A vaccine.** / Consult your health care provider.  Hepatitis B vaccine.** / Consult your health care provider.  Haemophilus influenzae type b (Hib) vaccine.** / Consult your health care provider. Ages 74 years and over  Blood pressure check.** / Every 1 to 2  years.  Lipid and cholesterol check.** / Every 5 years beginning at age 56 years.  Lung cancer screening. / Every year if you are aged 25 80 years and have a 30-pack-year history of smoking and currently smoke or have quit within the past 15 years. Yearly screening is stopped once you have quit smoking for at least 15 years or develop a health problem that would prevent you from having lung cancer treatment.  Clinical breast exam.** / Every year after age 60 years.  BRCA-related cancer risk assessment.** / For women who have family members with a BRCA-related cancer (breast, ovarian, tubal, or peritoneal cancers).  Mammogram.** / Every year beginning at age 72 years and continuing for as long as you are in good health. Consult with your health care provider.  Pap test.** / Every 3 years starting at age 37 years through age 37 or 25 years with 3 consecutive normal Pap tests. Testing can be stopped between 65 and 70 years with 3 consecutive normal Pap tests and no abnormal Pap or HPV tests in the past 10 years.  HPV screening.** / Every 3 years from ages 64 years through ages 90 or 65 years with a history of 3 consecutive normal Pap tests. Testing can be stopped between 65 and 70 years with 3 consecutive normal Pap tests and no abnormal Pap or HPV tests in the past 10 years.  Fecal occult blood test (FOBT) of stool. / Every year beginning at age 76 years and continuing until age 67 years. You may not need to do this test if you get a colonoscopy every 10 years.  Flexible sigmoidoscopy or colonoscopy.** / Every 5 years for a flexible sigmoidoscopy or every 10 years for a colonoscopy beginning at age 18 years and continuing until age 4 years.  Hepatitis C blood test.** / For all people born from [birth year] through 1966 and any individual with known risks for hepatitis C.  Osteoporosis screening.** / A one-time screening for women ages 73 years and over and women at risk for fractures or  osteoporosis.  Skin self-exam. / Monthly.  Influenza vaccine. / Every year.  Tetanus, diphtheria, and acellular pertussis (Tdap/Td) vaccine.** / 1 dose of Td every 10 years.  Varicella vaccine.** / Consult your health care provider.  Zoster vaccine.** / 1 dose for adults aged 60 years or older.  Pneumococcal 13-valent conjugate (PCV13) vaccine.** / Consult your  health care provider.  Pneumococcal polysaccharide (PPSV23) vaccine.** / 1 dose for all adults aged 12 years and older.  Meningococcal vaccine.** / Consult your health care provider.  Hepatitis A vaccine.** / Consult your health care provider.  Hepatitis B vaccine.** / Consult your health care provider.  Haemophilus influenzae type b (Hib) vaccine.** / Consult your health care provider. ** Family history and personal history of risk and conditions may change your health care provider's recommendations. Document Released: 08/27/2001 Document Revised: 04/21/2013 Document Reviewed: 11/26/2010 [facility] Patient Information 2014 [place], [place].

## 2013-09-21 NOTE — Progress Notes (Signed)
  Subjective:     Gabrielle Dominguez is a 38 y.o. female and is here for a comprehensive physical exam. The patient reports no problems. Has been on Depo-Lupron for several years.  She is taking Calcium but not aygestin, add back.  She has lost 40# over the last 6 months.  Cannot have definitive surgery at this time.  History   Social History  . Marital Status: Single    Spouse Name: N/A    Number of Children: 1  . Years of Education: N/A   Occupational History  . office manager    Social History Main Topics  . Smoking status: Former Smoker    Types: Cigarettes    Quit date: 07/15/2002  . Smokeless tobacco: Never Used  . Alcohol Use: Yes     Comment: rarely- social  . Drug Use: No  . Sexual Activity: Yes    Partners: Male    Birth Control/ Protection: None   Other Topics Concern  . Not on file   Social History Narrative   Caffeine: rarely   Lives with fiance, son (2000), mother, 1 dog   Occupation: unemployed   Edu: 12th grade   Activity: tries to walk   Diet: occasional fast food, lots of water, good fruits and vegetables, red meat 3x/wk   Health Maintenance  Topic Date Due  . Tetanus/tdap  03/30/1994  . Influenza Vaccine  02/12/2013  . Pap Smear  09/25/2015    The following portions of the patient's history were reviewed and updated as appropriate: allergies, current medications, past family history, past medical history, past social history, past surgical history and problem list.  Review of Systems A comprehensive review of systems was negative.   Objective:    BP 133/75  Pulse 63  Ht 5\' 4"  (1.626 m)  Wt 209 lb (94.802 kg)  BMI 35.86 kg/m2 General appearance: alert, cooperative and appears stated age Head: Normocephalic, without obvious abnormality, atraumatic Neck: no adenopathy, supple, symmetrical, trachea midline and thyroid not enlarged, symmetric, no tenderness/mass/nodules Lungs: clear to auscultation bilaterally Breasts: normal appearance, no  masses or tenderness Heart: regular rate and rhythm, S1, S2 normal, no murmur, click, rub or gallop Abdomen: soft, non-tender; bowel sounds normal; no masses,  no organomegaly Pelvic: cervix normal in appearance, external genitalia normal, no adnexal masses or tenderness, no cervical motion tenderness, uterus normal size, shape, and consistency and vagina normal without discharge Extremities: extremities normal, atraumatic, no cyanosis or edema Pulses: 2+ and symmetric Skin: Skin color, texture, turgor normal. No rashes or lesions Lymph nodes: Cervical, supraclavicular, and axillary nodes normal. Neurologic: Grossly normal    Assessment:    Healthy female exam. Endometriosis on DepoLupron     Plan:  Does not need pap--had nml one with neg HPV 1 year ago.  May continue Depo Lupron--will need Dexa next year. Advised to have add back therapy. Advised to have definitive surgery. Labs per PCP office.   See After Visit Summary for Counseling Recommendations

## 2013-11-19 ENCOUNTER — Telehealth: Payer: Self-pay

## 2013-11-19 DIAGNOSIS — E039 Hypothyroidism, unspecified: Secondary | ICD-10-CM

## 2013-11-19 NOTE — Telephone Encounter (Signed)
Thyroid functions ordered.  plz notify patient.

## 2013-11-19 NOTE — Telephone Encounter (Signed)
The patient called and is hoping to get thyroid labs done.  She states she is not feeling well (and believes it has to do with her thyroid).  I offered her an ov, but she is hoping to get just her labs done, and then schedule an ov.   Let me know if you would rather her come in first, I'll be happy to schedule her.    Thanks!   Pt Callback-  [phone]

## 2013-11-19 NOTE — Telephone Encounter (Signed)
Message left notifying patient to call and schedule lab appt and follow up appt.

## 2013-11-22 ENCOUNTER — Other Ambulatory Visit: Payer: Self-pay | Admitting: Family Medicine

## 2013-11-22 ENCOUNTER — Other Ambulatory Visit (INDEPENDENT_AMBULATORY_CARE_PROVIDER_SITE_OTHER): Payer: PRIVATE HEALTH INSURANCE

## 2013-11-22 DIAGNOSIS — E039 Hypothyroidism, unspecified: Secondary | ICD-10-CM

## 2013-11-22 LAB — T4, FREE: Free T4: 1.27 ng/dL (ref 0.60–1.60)

## 2013-11-22 LAB — TSH: TSH: 1.78 u[IU]/mL (ref 0.35–4.50)

## 2013-11-22 NOTE — Telephone Encounter (Signed)
Ok to refill based on recent lab results?

## 2013-11-23 ENCOUNTER — Encounter: Payer: Self-pay | Admitting: *Deleted

## 2013-12-13 ENCOUNTER — Ambulatory Visit (INDEPENDENT_AMBULATORY_CARE_PROVIDER_SITE_OTHER): Payer: PRIVATE HEALTH INSURANCE | Admitting: *Deleted

## 2013-12-13 ENCOUNTER — Encounter: Payer: Self-pay | Admitting: *Deleted

## 2013-12-13 DIAGNOSIS — N809 Endometriosis, unspecified: Secondary | ICD-10-CM

## 2014-03-29 ENCOUNTER — Ambulatory Visit (INDEPENDENT_AMBULATORY_CARE_PROVIDER_SITE_OTHER): Payer: PRIVATE HEALTH INSURANCE | Admitting: Family Medicine

## 2014-03-29 ENCOUNTER — Encounter: Payer: Self-pay | Admitting: Family Medicine

## 2014-03-29 VITALS — BP 132/83 | HR 60 | Ht 64.0 in | Wt 216.0 lb

## 2014-03-29 DIAGNOSIS — N803 Endometriosis of pelvic peritoneum, unspecified: Secondary | ICD-10-CM

## 2014-03-29 DIAGNOSIS — Z23 Encounter for immunization: Secondary | ICD-10-CM

## 2014-03-29 DIAGNOSIS — Z8742 Personal history of other diseases of the female genital tract: Secondary | ICD-10-CM

## 2014-03-29 NOTE — Assessment & Plan Note (Signed)
Risks include but are not limited to bleeding, infection, injury to surrounding structures, including bowel, bladder and ureters, blood clots, and death.  Likelihood of success is high. Desires TLH with bilateral salpingectomy. Pt. Desires preservation of ovaries if possible.

## 2014-03-29 NOTE — Patient Instructions (Signed)
Total Laparoscopic Hysterectomy °A total laparoscopic hysterectomy is a minimally invasive surgery to remove your uterus and cervix. This surgery is performed by making several small cuts (incisions) in your abdomen. It can also be done with a thin, lighted tube (laparoscope) inserted into two small incisions in your lower abdomen. Your fallopian tubes and ovaries can be removed (bilateral salpingo-oophorectomy) during this surgery as well. Benefits of minimally invasive surgery include: °· Less pain. °· Less risk of blood loss. °· Less risk of infection. °· Quicker return to normal activities. °LET YOUR HEALTH CARE PROVIDER KNOW ABOUT: °· Any allergies you have. °· All medicines you are taking, including vitamins, herbs, eye drops, creams, and over-the-counter medicines. °· Previous problems you or members of your family have had with the use of anesthetics. °· Any blood disorders you have. °· Previous surgeries you have had. °· Medical conditions you have. °RISKS AND COMPLICATIONS  °Generally, this is a safe procedure. However, as with any procedure, complications can occur. Possible complications include: °· Bleeding. °· Blood clots in the legs or lung. °· Infection. °· Injury to surrounding organs. °· Problems with anesthesia. °· Early menopause symptoms (hot flashes, night sweats, insomnia). °· Risk of conversion to an open abdominal incision. °BEFORE THE PROCEDURE °· Ask your health care provider about changing or stopping your regular medicines. °· Do not take aspirin or blood thinners (anticoagulants) for 1 week before the surgery or as told by your health care provider. °· Do not eat or drink anything for 8 hours before the surgery or as told by your health care provider. °· Quit smoking if you smoke. °· Arrange for a ride home after surgery and for someone to help you at home during recovery. °PROCEDURE  °· You will be given antibiotic medicine. °· An IV tube will be placed in your arm. You will be given  medicine to make you sleep (general anesthetic). °· A gas (carbon dioxide) will be used to inflate your abdomen. This will allow your surgeon to look inside your abdomen, perform your surgery, and treat any other problems found if necessary. °· Three or four small incisions (often less than 1/2 inch) will be made in your abdomen. One of these incisions will be made in the area of your belly button (navel). The laparoscope will be inserted into the incision. Your surgeon will look through the laparoscope while doing your procedure. °· Other surgical instruments will be inserted through the other incisions. °· Your uterus may be removed through your vagina or cut into small pieces and removed through the small incisions. °· Your incisions will be closed. °AFTER THE PROCEDURE °· The gas will be released from inside your abdomen. °· You will be taken to the recovery area where a nurse will watch and check your progress. Once you are awake, stable, and taking fluids well, without other problems, you will return to your room or be allowed to go home. °· There is usually minimal discomfort following the surgery because the incisions are so small. °· You will be given pain medicine while you are in the hospital and for when you go home. °Document Released: 04/28/2007 Document Revised: 03/03/2013 Document Reviewed: 01/19/2013 °[employer]® Patient Information ©2015 [employer]. This information is not intended to replace advice given to you by your health care provider. Make sure you discuss any questions you have with your health care provider. ° °

## 2014-03-29 NOTE — Progress Notes (Signed)
    Subjective:    Patient ID: Gabrielle Dominguez is a 38 y.o. female presenting with surgery consult  on 03/29/2014  HPI: Has long h/o endometriosis, with laparoscopic fulguration on 3 occasions previously.  Now on Depo-Lupron and no pain and no cycles.  Has h/o prior C-section x 1 with endometriosis implant in scar.  On Depo-Lupron x 3 years in a row.  Desires definitive therapy now.  Review of Systems  Constitutional: Negative for fever.  Respiratory: Negative for chest tightness and shortness of breath.   Cardiovascular: Negative for chest pain.  Gastrointestinal: Negative for abdominal pain.  Genitourinary: Negative for dysuria and menstrual problem.  Musculoskeletal: Negative for back pain.      Objective:    BP 132/83  Pulse 60  Ht 5\' 4"  (1.626 m)  Wt 216 lb (97.977 kg)  BMI 37.06 kg/m2 Physical Exam  Vitals reviewed. Constitutional: She appears well-developed and well-nourished.  Neck: Neck supple. No tracheal deviation present.  Cardiovascular: Normal rate.   Pulmonary/Chest: Effort normal.  Abdominal: Soft. There is no tenderness.  Musculoskeletal: Normal range of motion.        Assessment & Plan:   Problem List Items Addressed This Visit     Unprioritized   History of endometriosis     Risks include but are not limited to bleeding, infection, injury to surrounding structures, including bowel, bladder and ureters, blood clots, and death.  Likelihood of success is high. Desires TLH with bilateral salpingectomy. Pt. Desires preservation of ovaries if possible.      Other Visit Diagnoses   Need for immunization against influenza    -  Primary       Return in about 3 months (around 06/28/2014) for postop check.

## 2014-04-15 ENCOUNTER — Encounter: Payer: Self-pay | Admitting: Family Medicine

## 2014-04-15 ENCOUNTER — Ambulatory Visit (INDEPENDENT_AMBULATORY_CARE_PROVIDER_SITE_OTHER): Payer: PRIVATE HEALTH INSURANCE | Admitting: Family Medicine

## 2014-04-15 VITALS — BP 124/70 | HR 65 | Temp 97.8°F | Wt 217.0 lb

## 2014-04-15 DIAGNOSIS — E039 Hypothyroidism, unspecified: Secondary | ICD-10-CM

## 2014-04-15 DIAGNOSIS — F3181 Bipolar II disorder: Secondary | ICD-10-CM | POA: Insufficient documentation

## 2014-04-15 DIAGNOSIS — F39 Unspecified mood [affective] disorder: Secondary | ICD-10-CM

## 2014-04-15 MED ORDER — LURASIDONE HCL 20 MG PO TABS: 20.0000 mg | ORAL_TABLET | Freq: Every day | ORAL | Status: DC

## 2014-04-15 MED ORDER — LEVOTHYROXINE SODIUM 88 MCG PO TABS: ORAL_TABLET | ORAL | Status: DC

## 2014-04-15 NOTE — Progress Notes (Signed)
Pre visit review using our clinic review tool, if applicable. No additional management support is needed unless otherwise documented below in the visit note. 

## 2014-04-15 NOTE — Progress Notes (Signed)
BP 124/70  Pulse 65  Temp(Src) 97.8 F (36.6 C) (Oral)  Wt 217 lb (98.431 kg)  SpO2 96%   CC: discuss mood Subjective:    Patient ID: Gabrielle Dominguez, female    DOB: 07-07-1976, 38 y.o.   MRN: [id number]  HPI: Gabrielle Dominguez is a 38 y.o. female presenting on 04/15/2014 for Anxiety   Presents with mom today. Lives with husband and son.   Several year issue with anxiety. Gradually getting worse over last 3-6 months. "Trouble controlling emotions", lashing out, panic/anxiety attacks (chest tightness, dyspnea, heart beating fast), trouble sleeping at night time. Increasing mood swings. Feels very unhappy. Feels isolated. Energy level down, concentration down, not sleeping, appetite down, anhedonia, appetite normal. + guilt. No SI/HI. Mother endorses fmhx bipolar in father's side. Mother and pt endorses some episodes of mania described as positive energy, feeling able to take on the world, some participation in risky behavior without considering consequences. At times feels doesn't need to sleep, but has never gone days without sleep.  Sxs started after divorce 6 yrs ago. 15yo son, ex husband stresses her out - doesn't help financially. About to have hysterectomy for endometriosis. On lupron shot. About to lose house. Financial troubles.  Xanax in past caused hangover feeling. Did not like how it made her feel.  Current husband very supportive. Current work very supportive.  Relevant past medical, surgical, family and social history reviewed and updated as indicated.  Allergies and medications reviewed and updated. Current Outpatient Prescriptions on File Prior to Visit  Medication Sig  . fish oil-omega-3 fatty acids 1000 MG capsule Take 2 g by mouth daily.   Current Facility-Administered Medications on File Prior to Visit  Medication  . leuprolide (LUPRON) injection 11.25 mg   Patient Active Problem List   Diagnosis Date Noted  . Mood disorder 04/15/2014  . Blood in stool  09/07/2012  . Obesity 08/20/2012  . Anxiety and depression 10/10/2011  . Hypothyroidism   . History of endometriosis 03/26/2011  . HEMATURIA UNSPECIFIED 02/04/2010   Past Medical History  Diagnosis Date  . History of endometriosis   . Abnormal Pap smear   . Depression with anxiety   . Worsening headaches   . Hypothyroidism   . History of chicken pox   . History of pneumonia 07/2010, 04/2011  . IBS (irritable bowel syndrome)    Past Surgical History  Procedure Laterality Date  . Laparoscopic endometriosis fulguration  2002 and 2009  . Cesarean section  2000  . Colonoscopy  10/2012    normal, mult random biopsies obtained, normal Fuller Plan)   History  Substance Use Topics  . Smoking status: Former Smoker    Types: Cigarettes    Quit date: 07/15/2002  . Smokeless tobacco: Never Used  . Alcohol Use: Yes     Comment: rarely- social   Family History  Problem Relation Age of Onset  . Heart attack Paternal Grandfather   . Hypertension Paternal Grandfather   . Hypertension Maternal Grandmother   . Hypertension Father   . Melanoma Father   . Cancer Mother     vulvar  . Hypertension Mother   . Stroke Mother   . Colon cancer Maternal Aunt 71  . Hypertension Brother   . Stroke Maternal Grandfather   . Melanoma Cousin   . Irritable bowel syndrome Mother   . Diabetes Neg Hx     Review of Systems Per HPI unless specifically indicated above    Objective:  BP 124/70  Pulse 65  Temp(Src) 97.8 F (36.6 C) (Oral)  Wt 217 lb (98.431 kg)  SpO2 96%  Physical Exam  Nursing note and vitals reviewed. Constitutional: She appears well-developed and well-nourished. No distress.  Psychiatric: Her speech is normal and behavior is normal. Judgment and thought content normal. Her mood appears anxious. Cognition and memory are normal. She exhibits a depressed mood.  Tearful with discussion of stressors   Results for orders placed in visit on 11/22/13  TSH      Result Value Ref Range     TSH 1.78  0.35 - 4.50 uIU/mL  T4, FREE      Result Value Ref Range   Free T4 1.27  0.60 - 1.60 ng/dL      Assessment & Plan:   Problem List Items Addressed This Visit   Mood disorder - Primary     H/o anxiety/depression and describes sxs of same, but now also concern for hypomanic sxs. I suggested referral to psychiatry for evaluation of possible bipolar disorder. Concern for weight gain so will not prescribe depakote. Will do trial of latuda while she establishes with psychiatry for evaluation. Discussed monitoring for worsening SI on latuda. Update me if sxs worsening despite med. Support provided to patient and her mother. A total of 25 minutes were spent face-to-face with the patient during this encounter and over half of that time was spent on counseling and coordination of care.    Relevant Orders      Ambulatory referral to Psychiatry   Hypothyroidism     Levothyroxine 23mcg refilled.    Relevant Medications      levothyroxine (SYNTHROID, LEVOTHROID) tablet       Follow up plan: Return in about 2 months (around 06/15/2014), or as needed, for follow up visit.

## 2014-04-15 NOTE — Patient Instructions (Addendum)
Flu shot today. Start latuda 20mg  daily. Pass by [place] office to schedule psychiatry referral. Give me an update in 1-2 weeks with effect. I've refilled thyroid medication.

## 2014-04-15 NOTE — Assessment & Plan Note (Signed)
Levothyroxine 79mcg refilled.

## 2014-04-15 NOTE — Assessment & Plan Note (Addendum)
H/o anxiety/depression and describes sxs of same, but now also concern for hypomanic sxs. I suggested referral to psychiatry for evaluation of possible bipolar disorder. Concern for weight gain so will not prescribe depakote. Will do trial of latuda while she establishes with psychiatry for evaluation. Discussed monitoring for worsening SI on latuda. Update me if sxs worsening despite med. Support provided to patient and her mother. A total of 25 minutes were spent face-to-face with the patient during this encounter and over half of that time was spent on counseling and coordination of care.

## 2014-04-18 ENCOUNTER — Telehealth: Payer: Self-pay

## 2014-04-18 NOTE — Telephone Encounter (Signed)
I just received PA request this AM. PA completed through Covermymeds. Awaiting determination. Patient notified.

## 2014-04-18 NOTE — Telephone Encounter (Signed)
Pt left v/m requesting cb about status of Latuda prior auth so pt can start [place].Please advise.

## 2014-04-19 MED ORDER — DIVALPROEX SODIUM ER 500 MG PO TB24: ORAL_TABLET | ORAL | Status: DC

## 2014-04-19 NOTE — Telephone Encounter (Signed)
PA denied due to no step therapy failure. Needs to fail 2 of the following: Geodon, Risperdol, Seroquel or Zyprexa. In your IN box for review.

## 2014-04-19 NOTE — Telephone Encounter (Signed)
Called home and cell, straight to voicemail. could not find DPR form so no message left. plz notify latuda was denied by insurance. I'd like [place] to try depakote instead while we get in to see psychiatry - take 500mg  daily for 4 days then increase to bid. Let us know if any questions.

## 2014-04-19 NOTE — Addendum Note (Signed)
Addended by: Ria Bush on: 04/19/2014 06:01 PM   Modules accepted: Orders

## 2014-04-20 ENCOUNTER — Telehealth: Payer: Self-pay | Admitting: Family Medicine

## 2014-04-20 NOTE — Telephone Encounter (Signed)
Patient notified and verbalized understanding. 

## 2014-04-20 NOTE — Telephone Encounter (Signed)
Patient had a panic attack at work.  Patient wanted to speak to you. Please call patient.

## 2014-04-21 MED ORDER — ALPRAZOLAM 0.5 MG PO TABS: 0.5000 mg | ORAL_TABLET | ORAL | Status: DC

## 2014-04-21 NOTE — Telephone Encounter (Signed)
Patient notified and was willing to try alprazolam. Called in as directed. She is awaiting call to schedule psych appt.

## 2014-04-21 NOTE — Addendum Note (Signed)
Addended by: Royann Shivers A on: 04/21/2014 05:06 PM   Modules accepted: Orders

## 2014-04-21 NOTE — Telephone Encounter (Signed)
Unfortunately I cannot get it approved as she has not tried other meds that are required and I'm hesitant to try these other meds as they also have weight gain side effect. What I may recommend is to prescribe a temporary anxiety medication while we get her in to see psychiatrist. If she is willing for this, plz phone in alprazolam 0.5mg  0.5-1 tab BID PRN anxiety, #30 and RF 0, sedation precautions. Does she have appt with psychiatry yet?

## 2014-04-21 NOTE — Telephone Encounter (Signed)
Spoke with patient. She said she started reading the side effects of the depakote and they caused her to have a panic attack. She said she is very sensitive to medications and is afraid that she will have all of the listed side effects. She had agreed to the [facility] because of the minimal side effects and wants to know if there is anything you can do to get this approved for her.

## 2014-04-26 ENCOUNTER — Encounter (HOSPITAL_COMMUNITY): Payer: Self-pay | Admitting: Pharmacist

## 2014-05-03 NOTE — H&P (Signed)
  Gabrielle Dominguez is an 38 y.o. G53P1011 Unknown female.    Chief Complaint: Pelvic pain  HPI: Has long h/o endometriosis, with laparoscopic fulguration on 3 occasions previously. Now on Depo-Lupron and no pain and no cycles. Has h/o prior C-section x 1 with endometriosis implant in scar. On Depo-Lupron x 3 years in a row. Desires definitive therapy now.  Past Medical History  Diagnosis Date  . History of endometriosis   . Abnormal Pap smear   . Depression with anxiety   . Worsening headaches   . Hypothyroidism   . History of chicken pox   . History of pneumonia 07/2010, 04/2011  . IBS (irritable bowel syndrome)     Past Surgical History  Procedure Laterality Date  . Laparoscopic endometriosis fulguration  2002 and 2009  . Cesarean section  2000  . Colonoscopy  10/2012    normal, mult random biopsies obtained, normal Fuller Plan)    Family History  Problem Relation Age of Onset  . Heart attack Paternal Grandfather   . Hypertension Paternal Grandfather   . Hypertension Maternal Grandmother   . Hypertension Father   . Melanoma Father   . Cancer Mother     vulvar  . Hypertension Mother   . Stroke Mother   . Colon cancer Maternal Aunt 31  . Hypertension Brother   . Stroke Maternal Grandfather   . Melanoma Cousin   . Irritable bowel syndrome Mother   . Diabetes Neg Hx    Social History:  reports that she quit smoking about 11 years ago. Her smoking use included Cigarettes. She smoked 0.00 packs per day. She has never used smokeless tobacco. She reports that she drinks alcohol. She reports that she does not use illicit drugs.  Allergies:  Allergies  Allergen Reactions  . Penicillins Anaphylaxis and Hives    Current Facility-Administered Medications on File Prior to Encounter  Medication Dose Route Frequency Provider Last Rate Last Dose  . leuprolide (LUPRON) injection 11.25 mg  11.25 mg Intramuscular Q90 days Donnamae Jude, MD   11.25 mg at 12/13/13 1011   Current  Outpatient Prescriptions on File Prior to Encounter  Medication Sig Dispense Refill  . fish oil-omega-3 fatty acids 1000 MG capsule Take 2 g by mouth daily.        A comprehensive review of systems was negative.  There were no vitals taken for this visit. General appearance: alert, cooperative, appears stated age and moderately obese Head: Normocephalic, without obvious abnormality, atraumatic Neck: supple, symmetrical, trachea midline Lungs: normal effort Heart: regular rate and rhythm Abdomen: soft, non-tender; bowel sounds normal; no masses,  no organomegaly Extremities: extremities normal, atraumatic, no cyanosis or edema Neurologic: Grossly normal   Lab Results  Component Value Date   WBC 7.2 09/07/2012   HGB 15.5* 09/07/2012   HCT 44.2 09/07/2012   MCV 92.7 09/07/2012   PLT 303.0 09/07/2012    Assessment/Plan  Principal Problem:   History of endometriosis  For TLH with bilateral salpingectomy.--Risks include but are not limited to bleeding, infection, injury to surrounding structures, including bowel, bladder and ureters, blood clots, and death. Likelihood of success is high. Pt. Desires preservation of ovaries if possible.   Gabrielle Dominguez S 05/03/2014, 2:32 PM

## 2014-05-09 ENCOUNTER — Encounter (HOSPITAL_COMMUNITY)
Admission: RE | Admit: 2014-05-09 | Discharge: 2014-05-09 | Disposition: A | Discharge status: Home / Self Care | Payer: PRIVATE HEALTH INSURANCE | Source: Ambulatory Visit | Attending: Family Medicine | Admitting: Family Medicine

## 2014-05-09 ENCOUNTER — Encounter (HOSPITAL_COMMUNITY): Payer: Self-pay

## 2014-05-09 ENCOUNTER — Encounter (HOSPITAL_COMMUNITY): Payer: Self-pay | Admitting: Anesthesiology

## 2014-05-09 DIAGNOSIS — N840 Polyp of corpus uteri: Secondary | ICD-10-CM | POA: Diagnosis not present

## 2014-05-09 DIAGNOSIS — N809 Endometriosis, unspecified: Secondary | ICD-10-CM | POA: Diagnosis present

## 2014-05-09 DIAGNOSIS — K589 Irritable bowel syndrome without diarrhea: Secondary | ICD-10-CM | POA: Diagnosis not present

## 2014-05-09 DIAGNOSIS — E039 Hypothyroidism, unspecified: Secondary | ICD-10-CM | POA: Diagnosis not present

## 2014-05-09 DIAGNOSIS — Z880 Allergy status to penicillin: Secondary | ICD-10-CM | POA: Diagnosis not present

## 2014-05-09 DIAGNOSIS — N802 Endometriosis of fallopian tube: Secondary | ICD-10-CM | POA: Diagnosis not present

## 2014-05-09 DIAGNOSIS — N3289 Other specified disorders of bladder: Secondary | ICD-10-CM | POA: Diagnosis not present

## 2014-05-09 DIAGNOSIS — N879 Dysplasia of cervix uteri, unspecified: Secondary | ICD-10-CM | POA: Diagnosis not present

## 2014-05-09 DIAGNOSIS — D259 Leiomyoma of uterus, unspecified: Secondary | ICD-10-CM | POA: Diagnosis not present

## 2014-05-09 DIAGNOSIS — F418 Other specified anxiety disorders: Secondary | ICD-10-CM | POA: Diagnosis not present

## 2014-05-09 LAB — CBC
HEMATOCRIT: 44.3 % (ref 36.0–46.0)
Hemoglobin: 16 g/dL — ABNORMAL HIGH (ref 12.0–15.0)
MCH: 33 pg (ref 26.0–34.0)
MCHC: 36.1 g/dL — ABNORMAL HIGH (ref 30.0–36.0)
MCV: 91.3 fL (ref 78.0–100.0)
Platelets: 280 10*3/uL (ref 150–400)
RBC: 4.85 MIL/uL (ref 3.87–5.11)
RDW: 11.9 % (ref 11.5–15.5)
WBC: 6.4 10*3/uL (ref 4.0–10.5)

## 2014-05-09 LAB — HCG, SERUM, QUALITATIVE: Preg, Serum: NEGATIVE

## 2014-05-09 MED ORDER — GENTAMICIN SULFATE 40 MG/ML IJ SOLN
INTRAVENOUS | Status: AC
  Administered 2014-05-10: 115.25 mL via INTRAVENOUS
  Filled 2014-05-09: qty 9.25

## 2014-05-09 NOTE — Patient Instructions (Signed)
Your procedure is scheduled on:05/10/14  Enter through the Main Entrance at : [place] up desk phone and dial [phone] and inform us of your arrival.  Please call [phone] if you have any problems the morning of surgery.  Remember: Do not eat food or drink liquids, including water, after midnight:tonight  You may brush your teeth the morning of surgery.  Take these meds the morning of surgery with a sip of water:Synthroid  DO NOT wear jewelry, eye make-up, lipstick,body lotion, or dark fingernail polish.  (Polished toes are ok) You may wear deodorant.  If you are to be admitted after surgery, leave suitcase in car until your room has been assigned. Patients discharged on the day of surgery will not be allowed to drive home. Wear loose fitting, comfortable clothes for your ride home.

## 2014-05-09 NOTE — Anesthesia Preprocedure Evaluation (Addendum)
Anesthesia Evaluation  Patient identified by MRN, date of birth, ID band Patient awake    Reviewed: Allergy & Precautions, H&P , NPO status , Patient's Chart, lab work & pertinent test results  Airway Mallampati: IV  TM Distance: >3 FB Neck ROM: Full    Dental no notable dental hx. (+) Teeth Intact   Pulmonary former smoker,  Snores- ?undiagnosed OSA breath sounds clear to auscultation  Pulmonary exam normal       Cardiovascular negative cardio ROS  Rhythm:Regular Rate:Normal     Neuro/Psych  Headaches, PSYCHIATRIC DISORDERS Anxiety Depression    GI/Hepatic negative GI ROS, Neg liver ROS,   Endo/Other  Hypothyroidism Obesity  Renal/GU negative Renal ROS  negative genitourinary   Musculoskeletal negative musculoskeletal ROS (+)   Abdominal (+) + obese,   Peds  Hematology   Anesthesia Other Findings   Reproductive/Obstetrics Endometriosis                            Anesthesia Physical Anesthesia Plan  ASA: II  Anesthesia Plan: General   Post-op Pain Management:    Induction: Intravenous  Airway Management Planned: Oral ETT  Additional Equipment:   Intra-op Plan:   Post-operative Plan: Extubation in OR  Informed Consent: I have reviewed the patients History and Physical, chart, labs and discussed the procedure including the risks, benefits and alternatives for the proposed anesthesia with the patient or authorized representative who has indicated his/her understanding and acceptance.   Dental advisory given  Plan Discussed with: Anesthesiologist, CRNA and Surgeon  Anesthesia Plan Comments:         Anesthesia Quick Evaluation

## 2014-05-09 NOTE — Pre-Procedure Instructions (Signed)
Patient requested serum preg test with preop labs-instead of UPT on DOS. Per Sena Slate in OB/GYN office ok to do SPT per Dr. Kennon Rounds.

## 2014-05-10 ENCOUNTER — Encounter (HOSPITAL_COMMUNITY): Payer: PRIVATE HEALTH INSURANCE | Admitting: Anesthesiology

## 2014-05-10 ENCOUNTER — Ambulatory Visit (HOSPITAL_COMMUNITY)
Admission: RE | Admit: 2014-05-10 | Discharge: 2014-05-11 | Disposition: A | Discharge status: Home / Self Care | Payer: PRIVATE HEALTH INSURANCE | Source: Ambulatory Visit | Attending: Family Medicine | Admitting: Family Medicine

## 2014-05-10 ENCOUNTER — Encounter (HOSPITAL_COMMUNITY): Payer: Self-pay | Admitting: Anesthesiology

## 2014-05-10 ENCOUNTER — Encounter (HOSPITAL_COMMUNITY)
Admission: RE | Disposition: A | Discharge status: Home / Self Care | Payer: Self-pay | Source: Ambulatory Visit | Attending: Family Medicine

## 2014-05-10 ENCOUNTER — Ambulatory Visit (HOSPITAL_COMMUNITY): Payer: PRIVATE HEALTH INSURANCE | Admitting: Anesthesiology

## 2014-05-10 DIAGNOSIS — Z880 Allergy status to penicillin: Secondary | ICD-10-CM | POA: Insufficient documentation

## 2014-05-10 DIAGNOSIS — K589 Irritable bowel syndrome without diarrhea: Secondary | ICD-10-CM | POA: Insufficient documentation

## 2014-05-10 DIAGNOSIS — Z8742 Personal history of other diseases of the female genital tract: Secondary | ICD-10-CM

## 2014-05-10 DIAGNOSIS — N802 Endometriosis of fallopian tube: Secondary | ICD-10-CM | POA: Insufficient documentation

## 2014-05-10 DIAGNOSIS — Z90710 Acquired absence of both cervix and uterus: Secondary | ICD-10-CM | POA: Diagnosis present

## 2014-05-10 DIAGNOSIS — N736 Female pelvic peritoneal adhesions (postinfective): Secondary | ICD-10-CM

## 2014-05-10 DIAGNOSIS — D259 Leiomyoma of uterus, unspecified: Secondary | ICD-10-CM | POA: Insufficient documentation

## 2014-05-10 DIAGNOSIS — N879 Dysplasia of cervix uteri, unspecified: Secondary | ICD-10-CM | POA: Insufficient documentation

## 2014-05-10 DIAGNOSIS — N840 Polyp of corpus uteri: Secondary | ICD-10-CM | POA: Insufficient documentation

## 2014-05-10 DIAGNOSIS — E039 Hypothyroidism, unspecified: Secondary | ICD-10-CM | POA: Insufficient documentation

## 2014-05-10 DIAGNOSIS — F418 Other specified anxiety disorders: Secondary | ICD-10-CM | POA: Insufficient documentation

## 2014-05-10 DIAGNOSIS — N854 Malposition of uterus: Secondary | ICD-10-CM

## 2014-05-10 DIAGNOSIS — N3289 Other specified disorders of bladder: Secondary | ICD-10-CM | POA: Insufficient documentation

## 2014-05-10 HISTORY — PX: LAPAROSCOPIC ASSISTED VAGINAL HYSTERECTOMY: SHX5398

## 2014-05-10 HISTORY — PX: BILATERAL SALPINGECTOMY: SHX5743

## 2014-05-10 SURGERY — SALPINGECTOMY, BILATERAL, OPEN
Anesthesia: General | Site: Abdomen

## 2014-05-10 MED ORDER — LIDOCAINE HCL (CARDIAC) 20 MG/ML IV SOLN
INTRAVENOUS | Status: AC
  Filled 2014-05-10: qty 5

## 2014-05-10 MED ORDER — NEOSTIGMINE METHYLSULFATE 10 MG/10ML IV SOLN
INTRAVENOUS | Status: AC
  Filled 2014-05-10: qty 1

## 2014-05-10 MED ORDER — HYDROMORPHONE HCL 1 MG/ML IJ SOLN
1.0000 mg | INTRAMUSCULAR | Status: DC | PRN
  Administered 2014-05-11 (×3): 1 mg via INTRAVENOUS
  Filled 2014-05-10 (×3): qty 1

## 2014-05-10 MED ORDER — DIPHENHYDRAMINE HCL 12.5 MG/5ML PO ELIX
12.5000 mg | ORAL_SOLUTION | Freq: Four times a day (QID) | ORAL | Status: DC | PRN
  Filled 2014-05-10: qty 5

## 2014-05-10 MED ORDER — FENTANYL CITRATE 0.05 MG/ML IJ SOLN
INTRAMUSCULAR | Status: AC
  Filled 2014-05-10: qty 5

## 2014-05-10 MED ORDER — DEXAMETHASONE SODIUM PHOSPHATE 10 MG/ML IJ SOLN
INTRAMUSCULAR | Status: DC | PRN
  Administered 2014-05-10: 4 mg via INTRAVENOUS

## 2014-05-10 MED ORDER — DIPHENHYDRAMINE HCL 50 MG/ML IJ SOLN: 12.5000 mg | Freq: Four times a day (QID) | INTRAMUSCULAR | Status: DC | PRN

## 2014-05-10 MED ORDER — GLYCOPYRROLATE 0.2 MG/ML IJ SOLN
INTRAMUSCULAR | Status: AC
  Filled 2014-05-10: qty 1

## 2014-05-10 MED ORDER — IBUPROFEN 600 MG PO TABS
600.0000 mg | ORAL_TABLET | Freq: Four times a day (QID) | ORAL | Status: DC | PRN
  Administered 2014-05-11: 600 mg via ORAL
  Filled 2014-05-10: qty 1

## 2014-05-10 MED ORDER — ESTRADIOL 0.1 MG/GM VA CREA
TOPICAL_CREAM | VAGINAL | Status: DC | PRN
  Administered 2014-05-10: 1 via VAGINAL

## 2014-05-10 MED ORDER — ALPRAZOLAM 0.5 MG PO TABS: 0.5000 mg | ORAL_TABLET | ORAL | Status: DC

## 2014-05-10 MED ORDER — KETOROLAC TROMETHAMINE 30 MG/ML IJ SOLN
30.0000 mg | Freq: Four times a day (QID) | INTRAMUSCULAR | Status: DC
  Administered 2014-05-10 – 2014-05-11 (×3): 30 mg via INTRAVENOUS
  Filled 2014-05-10 (×3): qty 1

## 2014-05-10 MED ORDER — ROCURONIUM BROMIDE 100 MG/10ML IV SOLN
INTRAVENOUS | Status: DC | PRN
  Administered 2014-05-10 (×4): 10 mg via INTRAVENOUS
  Administered 2014-05-10: 40 mg via INTRAVENOUS
  Administered 2014-05-10: 10 mg via INTRAVENOUS

## 2014-05-10 MED ORDER — VASOPRESSIN 20 UNIT/ML IV SOLN
INTRAVENOUS | Status: AC
  Filled 2014-05-10: qty 1

## 2014-05-10 MED ORDER — LIDOCAINE HCL (CARDIAC) 20 MG/ML IV SOLN
INTRAVENOUS | Status: DC | PRN
  Administered 2014-05-10: 80 mg via INTRAVENOUS

## 2014-05-10 MED ORDER — ONDANSETRON HCL 4 MG/2ML IJ SOLN
4.0000 mg | Freq: Four times a day (QID) | INTRAMUSCULAR | Status: DC | PRN
  Administered 2014-05-10 – 2014-05-11 (×2): 4 mg via INTRAVENOUS
  Filled 2014-05-10 (×3): qty 2

## 2014-05-10 MED ORDER — LACTATED RINGERS IR SOLN
Status: DC | PRN
  Administered 2014-05-10: 3000 mL

## 2014-05-10 MED ORDER — NEOSTIGMINE METHYLSULFATE 10 MG/10ML IV SOLN
INTRAVENOUS | Status: DC | PRN
  Administered 2014-05-10: 3 mg via INTRAVENOUS

## 2014-05-10 MED ORDER — HYDROMORPHONE 0.3 MG/ML IV SOLN
INTRAVENOUS | Status: DC
  Administered 2014-05-10: 1.39 mg via INTRAVENOUS
  Administered 2014-05-10: 2.79 mg via INTRAVENOUS
  Administered 2014-05-10: 1.19 mg via INTRAVENOUS
  Administered 2014-05-10: 13:00:00 via INTRAVENOUS
  Filled 2014-05-10: qty 25

## 2014-05-10 MED ORDER — FENTANYL CITRATE 0.05 MG/ML IJ SOLN
INTRAMUSCULAR | Status: AC
  Filled 2014-05-10: qty 2

## 2014-05-10 MED ORDER — SODIUM CHLORIDE 0.9 % IJ SOLN: 9.0000 mL | INTRAMUSCULAR | Status: DC | PRN

## 2014-05-10 MED ORDER — LEVOTHYROXINE SODIUM 88 MCG PO TABS
88.0000 ug | ORAL_TABLET | Freq: Every day | ORAL | Status: DC
  Administered 2014-05-11: 88 ug via ORAL
  Filled 2014-05-10 (×2): qty 1

## 2014-05-10 MED ORDER — GLYCOPYRROLATE 0.2 MG/ML IJ SOLN
INTRAMUSCULAR | Status: DC | PRN
  Administered 2014-05-10: 0.6 mg via INTRAVENOUS
  Administered 2014-05-10 (×2): 0.1 mg via INTRAVENOUS

## 2014-05-10 MED ORDER — ESTRADIOL 0.1 MG/GM VA CREA
TOPICAL_CREAM | VAGINAL | Status: AC
  Filled 2014-05-10: qty 42.5

## 2014-05-10 MED ORDER — BUPIVACAINE HCL (PF) 0.25 % IJ SOLN
INTRAMUSCULAR | Status: DC | PRN
  Administered 2014-05-10: 9 mL

## 2014-05-10 MED ORDER — SODIUM CHLORIDE 0.9 % IJ SOLN
INTRAMUSCULAR | Status: AC
  Filled 2014-05-10: qty 100

## 2014-05-10 MED ORDER — ROCURONIUM BROMIDE 100 MG/10ML IV SOLN
INTRAVENOUS | Status: AC
  Filled 2014-05-10: qty 1

## 2014-05-10 MED ORDER — FENTANYL CITRATE 0.05 MG/ML IJ SOLN
INTRAMUSCULAR | Status: DC | PRN
  Administered 2014-05-10: 50 ug via INTRAVENOUS
  Administered 2014-05-10: 100 ug via INTRAVENOUS
  Administered 2014-05-10 (×4): 50 ug via INTRAVENOUS

## 2014-05-10 MED ORDER — GLYCOPYRROLATE 0.2 MG/ML IJ SOLN
INTRAMUSCULAR | Status: AC
  Filled 2014-05-10: qty 3

## 2014-05-10 MED ORDER — HYDROMORPHONE HCL 1 MG/ML IJ SOLN
INTRAMUSCULAR | Status: AC
  Administered 2014-05-10: 0.5 mg via INTRAVENOUS
  Filled 2014-05-10: qty 1

## 2014-05-10 MED ORDER — LACTATED RINGERS IV SOLN
INTRAVENOUS | Status: DC
Start: 2014-05-10 — End: 2014-05-10
  Administered 2014-05-10 (×3): via INTRAVENOUS

## 2014-05-10 MED ORDER — HYDROMORPHONE HCL 1 MG/ML IJ SOLN
INTRAMUSCULAR | Status: DC | PRN
  Administered 2014-05-10 (×2): 0.5 mg via INTRAVENOUS

## 2014-05-10 MED ORDER — VASOPRESSIN 20 UNIT/ML IV SOLN
INTRAVENOUS | Status: DC | PRN
  Administered 2014-05-10: 09:00:00 via INTRAMUSCULAR

## 2014-05-10 MED ORDER — OXYCODONE-ACETAMINOPHEN 5-325 MG PO TABS
1.0000 | ORAL_TABLET | ORAL | Status: DC | PRN
  Administered 2014-05-11 (×2): 2 via ORAL
  Filled 2014-05-10 (×2): qty 2

## 2014-05-10 MED ORDER — KETOROLAC TROMETHAMINE 30 MG/ML IJ SOLN
INTRAMUSCULAR | Status: DC | PRN
  Administered 2014-05-10: 30 mg via INTRAVENOUS

## 2014-05-10 MED ORDER — PROPOFOL 10 MG/ML IV BOLUS
INTRAVENOUS | Status: DC | PRN
  Administered 2014-05-10: 250 mg via INTRAVENOUS

## 2014-05-10 MED ORDER — PROPOFOL 10 MG/ML IV EMUL
INTRAVENOUS | Status: AC
  Filled 2014-05-10: qty 40

## 2014-05-10 MED ORDER — METOCLOPRAMIDE HCL 5 MG/ML IJ SOLN: 10.0000 mg | Freq: Once | INTRAMUSCULAR | Status: DC | PRN

## 2014-05-10 MED ORDER — DEXAMETHASONE SODIUM PHOSPHATE 4 MG/ML IJ SOLN
INTRAMUSCULAR | Status: AC
  Filled 2014-05-10: qty 1

## 2014-05-10 MED ORDER — MIDAZOLAM HCL 2 MG/2ML IJ SOLN
INTRAMUSCULAR | Status: DC | PRN
  Administered 2014-05-10: 2 mg via INTRAVENOUS

## 2014-05-10 MED ORDER — KETOROLAC TROMETHAMINE 30 MG/ML IJ SOLN
INTRAMUSCULAR | Status: AC
  Filled 2014-05-10: qty 1

## 2014-05-10 MED ORDER — LACTATED RINGERS IV SOLN
INTRAVENOUS | Status: DC
  Administered 2014-05-10: 06:00:00 via INTRAVENOUS

## 2014-05-10 MED ORDER — MIDAZOLAM HCL 2 MG/2ML IJ SOLN
INTRAMUSCULAR | Status: AC
  Filled 2014-05-10: qty 2

## 2014-05-10 MED ORDER — SCOPOLAMINE 1 MG/3DAYS TD PT72
MEDICATED_PATCH | TRANSDERMAL | Status: AC
  Administered 2014-05-10: 1.5 mg via TRANSDERMAL
  Filled 2014-05-10: qty 1

## 2014-05-10 MED ORDER — SCOPOLAMINE 1 MG/3DAYS TD PT72
1.0000 | MEDICATED_PATCH | Freq: Once | TRANSDERMAL | Status: DC
Start: 2014-05-10 — End: 2014-05-10
  Administered 2014-05-10: 1.5 mg via TRANSDERMAL

## 2014-05-10 MED ORDER — BUPIVACAINE HCL (PF) 0.25 % IJ SOLN
INTRAMUSCULAR | Status: AC
  Filled 2014-05-10: qty 30

## 2014-05-10 MED ORDER — MENTHOL 3 MG MT LOZG
1.0000 | LOZENGE | OROMUCOSAL | Status: DC | PRN
  Filled 2014-05-10: qty 9

## 2014-05-10 MED ORDER — KETOROLAC TROMETHAMINE 30 MG/ML IJ SOLN: 30.0000 mg | Freq: Once | INTRAMUSCULAR | Status: DC

## 2014-05-10 MED ORDER — ONDANSETRON HCL 4 MG/2ML IJ SOLN
INTRAMUSCULAR | Status: DC | PRN
  Administered 2014-05-10: 4 mg via INTRAVENOUS

## 2014-05-10 MED ORDER — MEPERIDINE HCL 25 MG/ML IJ SOLN: 6.2500 mg | INTRAMUSCULAR | Status: DC | PRN

## 2014-05-10 MED ORDER — ONDANSETRON HCL 4 MG/2ML IJ SOLN
INTRAMUSCULAR | Status: AC
  Filled 2014-05-10: qty 2

## 2014-05-10 MED ORDER — HYDROMORPHONE HCL 1 MG/ML IJ SOLN
0.2500 mg | INTRAMUSCULAR | Status: DC | PRN
  Administered 2014-05-10 (×2): 0.5 mg via INTRAVENOUS

## 2014-05-10 MED ORDER — KETOROLAC TROMETHAMINE 30 MG/ML IJ SOLN: 30.0000 mg | Freq: Four times a day (QID) | INTRAMUSCULAR | Status: DC

## 2014-05-10 MED ORDER — NALOXONE HCL 0.4 MG/ML IJ SOLN: 0.4000 mg | INTRAMUSCULAR | Status: DC | PRN

## 2014-05-10 MED ORDER — HYDROMORPHONE HCL 1 MG/ML IJ SOLN
INTRAMUSCULAR | Status: AC
  Filled 2014-05-10: qty 1

## 2014-05-10 MED ORDER — DEXTROSE IN LACTATED RINGERS 5 % IV SOLN
INTRAVENOUS | Status: DC
  Administered 2014-05-10 – 2014-05-11 (×2): via INTRAVENOUS

## 2014-05-10 SURGICAL SUPPLY — 51 items
ADH SKN CLS APL DERMABOND .7 (GAUZE/BANDAGES/DRESSINGS) ×1
APPLICATOR COTTON TIP 6IN STRL (MISCELLANEOUS) ×2 IMPLANT
BLADE 15 SAFETY STRL DISP (BLADE) ×3 IMPLANT
CABLE HIGH FREQUENCY MONO STRZ (ELECTRODE) IMPLANT
CLOTH BEACON ORANGE TIMEOUT ST (SAFETY) ×3 IMPLANT
CONT PATH 16OZ SNAP LID 3702 (MISCELLANEOUS) ×2 IMPLANT
COUNTER NEEDLE 1200 MAGNETIC (NEEDLE) ×2 IMPLANT
COVER BACK TABLE 60X90IN (DRAPES) ×3 IMPLANT
COVER LIGHT HANDLE  1/PK (MISCELLANEOUS)
COVER LIGHT HANDLE 1/PK (MISCELLANEOUS) IMPLANT
COVER MAYO STAND STRL (DRAPES) ×3 IMPLANT
DERMABOND ADVANCED (GAUZE/BANDAGES/DRESSINGS) ×2
DERMABOND ADVANCED .7 DNX12 (GAUZE/BANDAGES/DRESSINGS) ×1 IMPLANT
DEVICE SUTURE ENDOST 10MM (ENDOMECHANICALS) IMPLANT
DRAPE LG THREE QUARTER DISP (DRAPES) ×4 IMPLANT
DURAPREP 26ML APPLICATOR (WOUND CARE) ×3 IMPLANT
ENDOSTITCH 0 SINGLE 48 (SUTURE) IMPLANT
EVACUATOR SMOKE 8.L (FILTER) ×3 IMPLANT
GAUZE PACKING 1 X5 YD ST (GAUZE/BANDAGES/DRESSINGS) ×2 IMPLANT
GLOVE BIOGEL PI IND STRL 7.0 (GLOVE) ×3 IMPLANT
GLOVE BIOGEL PI INDICATOR 7.0 (GLOVE) ×10
GLOVE ECLIPSE 7.0 STRL STRAW (GLOVE) ×7 IMPLANT
GOWN STRL REUS W/ TWL LRG LVL3 (GOWN DISPOSABLE) ×7 IMPLANT
GOWN STRL REUS W/TWL LRG LVL3 (GOWN DISPOSABLE) ×21
LEGGING LITHOTOMY PAIR STRL (DRAPES) ×2 IMPLANT
NS IRRIG 1000ML POUR BTL (IV SOLUTION) ×3 IMPLANT
PACK LAPAROSCOPY BASIN (CUSTOM PROCEDURE TRAY) ×3 IMPLANT
PAD TRENDELENBURG OR TABLE (MISCELLANEOUS) ×3 IMPLANT
SCISSORS LAP 5X35 DISP (ENDOMECHANICALS) IMPLANT
SEALER TISSUE G2 CVD JAW 35 (ENDOMECHANICALS) IMPLANT
SEALER TISSUE G2 CVD JAW 45CM (ENDOMECHANICALS)
SET CYSTO W/LG BORE CLAMP LF (SET/KITS/TRAYS/PACK) IMPLANT
SET IRRIG TUBING LAPAROSCOPIC (IRRIGATION / IRRIGATOR) ×3 IMPLANT
SHEARS HARMONIC ACE PLUS 36CM (ENDOMECHANICALS) ×3 IMPLANT
SUT VIC AB 0 CT1 18XCR BRD8 (SUTURE) IMPLANT
SUT VIC AB 0 CT1 8-18 (SUTURE) ×6
SUT VIC AB 3-0 X1 27 (SUTURE) ×3 IMPLANT
SUT VICRYL 0 UR6 27IN ABS (SUTURE) ×6 IMPLANT
SUT VICRYL 1 TIES 12X18 (SUTURE) ×2 IMPLANT
SUT VICRYL RAPIDE 3 0 (SUTURE) ×3 IMPLANT
TIP UTERINE 6.7X6CM WHT DISP (MISCELLANEOUS) ×2 IMPLANT
TOWEL OR 17X24 6PK STRL BLUE (TOWEL DISPOSABLE) ×8 IMPLANT
TRAY FOLEY CATH 14FR (SET/KITS/TRAYS/PACK) ×3 IMPLANT
TROCAR BALLN 12MMX100 BLUNT (TROCAR) IMPLANT
TROCAR XCEL NON-BLD 11X100MML (ENDOMECHANICALS) ×6 IMPLANT
TROCAR XCEL NON-BLD 5MMX100MML (ENDOMECHANICALS) ×6 IMPLANT
TUBING NON-CON 1/4 X 20 CONN (TUBING) ×1 IMPLANT
TUBING NON-CON 1/4 X 20' CONN (TUBING) ×1
WARMER LAPAROSCOPE (MISCELLANEOUS) ×3 IMPLANT
WATER STERILE IRR 1000ML POUR (IV SOLUTION) ×3 IMPLANT
YANKAUER SUCT BULB TIP NO VENT (SUCTIONS) ×2 IMPLANT

## 2014-05-10 NOTE — Anesthesia Postprocedure Evaluation (Signed)
  Anesthesia Post-op Note  Patient: [name]  Procedure(s) Performed: Procedure(s): BILATERAL SALPINGECTOMY (N/A) LAPAROSCOPIC ASSISTED VAGINAL HYSTERECTOMY (N/A)  Patient Location: PACU  Anesthesia Type:General  Level of Consciousness: awake, alert  and oriented  Airway and Oxygen Therapy: Patient Spontanous Breathing  Post-op Pain: mild  Post-op Assessment: Post-op Vital signs reviewed, Patient's Cardiovascular Status Stable, Respiratory Function Stable, Patent Airway, No signs of Nausea or vomiting and Pain level controlled  Post-op Vital Signs: Reviewed and stable  Last Vitals:  Filed Vitals:   05/10/14 1215  BP: 100/55  Pulse: 69  Temp:   Resp: 17    Complications: No apparent anesthesia complications

## 2014-05-10 NOTE — Anesthesia Postprocedure Evaluation (Signed)
Anesthesia Post Note  Patient: Gabrielle Dominguez  Procedure(s) Performed: Procedure(s): BILATERAL SALPINGECTOMY (N/A) LAPAROSCOPIC ASSISTED VAGINAL HYSTERECTOMY (N/A)  Anesthesia type: General  Patient location: Women's Unit  Post pain: Pain level controlled  Post assessment: Post-op Vital signs reviewed  Last Vitals: BP 97/49  Pulse 58  Temp(Src) 36.6 C (Oral)  Resp 20  Ht 5\' 4"  (1.626 m)  Wt 222 lb (100.699 kg)  BMI 38.09 kg/m2  SpO2 93%  Post vital signs: Reviewed  Level of consciousness: awake  Complications: No apparent anesthesia complications

## 2014-05-10 NOTE — Addendum Note (Signed)
Addendum created 05/10/14 1543 by Flossie Dibble, CRNA   Modules edited: Notes Section   Notes Section:  File: [id number]

## 2014-05-10 NOTE — Interval H&P Note (Signed)
History and Physical Interval Note:  05/10/2014 7:18 AM  Gabrielle Dominguez  has presented today for surgery, with the diagnosis of cpt [id number] - Long history of endometriosis w/ lsc fulguration on 3 occasions previously  The various methods of treatment have been discussed with the patient and family. After consideration of risks, benefits and other options for treatment, the patient has consented to  Procedure(s): HYSTERECTOMY TOTAL LAPAROSCOPIC (N/A) as a surgical intervention .  The patient's history has been reviewed, patient examined, no change in status, stable for surgery.  I have reviewed the patient's chart and labs.  Questions were answered to the patient's satisfaction.     [place]

## 2014-05-10 NOTE — Transfer of Care (Signed)
Immediate Anesthesia Transfer of Care Note  Patient: Gabrielle Dominguez  Procedure(s) Performed: Procedure(s): HYSTERECTOMY TOTAL LAPAROSCOPIC (N/A) BILATERAL SALPINGECTOMY  Patient Location: PACU  Anesthesia Type:General  Level of Consciousness: awake, alert , oriented and patient cooperative  Airway & Oxygen Therapy: Patient Spontanous Breathing and Patient connected to nasal cannula oxygen  Post-op Assessment: Report given to PACU RN and Post -op Vital signs reviewed and stable  Post vital signs: Reviewed and stable  Complications: No apparent anesthesia complications

## 2014-05-10 NOTE — Op Note (Signed)
Preoperative diagnosis: Principal Problem:   History of endometriosis   Postoperative diagnosis: Same  Procedure: Laparoscopic assisted vaginal hysterectomy, bilateral salpingectomy  Surgeon: Standley Dakins. Kennon Rounds, M.D.  Asst.: Lavonia Drafts, M.D.  Anesthesia: Gen.-Michael A. Royce Macadamia, MD  Findings: Dense adhesions of bladder to uterus, scar tissue from uterus to right side wall, normal appearing ovaries, retroverted uterus  Estimated blood loss: 200 cc  Specimen: Uterus, tubes to pathology  Reason for procedure: 38 y.o. G2P1011 who presents with long h/o endometriosis who has been on Depo Lupron for many years.  Has had 3 previous surgeries with fulguration of endometriosis and previous C-section with endometriosis of C-section scar.The patient desired definitive treatment.  Risks of repeat hysterectomy reviewed.  Risks include but are not limited to bleeding, infection, injury to surrounding structures, including bowel, bladder and ureters, blood clots, and death.  Procedure: Patient was taken to the OR where she was placed in dorsal lithotomy in [place]. She was prepped and draped in the usual sterile fashion. A timeout was performed. The patient received [place] and [place] prior to procedure. The patient had SCDs in place. A speculum was placed inside the vagina. The cervix was measured to accommodate a small Rumi cup. This was sewn in place with 2 Vicryl sutures. The uterus sounded to 7 cm. A 6 Rumi was placed. A Foley catheter is placed inside her bladder. Attention was then turned to the abdomen. In the LUQ, under the last costal margin a 5 mm incision was made and the opti-view was used to enter the peritoneum. A pneumoperitoneum was created. The patient was placed in Trendelenburg. The uterus was lifted up. It was stuck on the right side. A 1 cc of quarter percent Marcaine were injected in the left and right lower quadrants. Two #5  trochars were placed under direct visualization.  The uterus was brought up with a single-tooth tenaculum and the Harmonic Scalpel was used to take the tubes and round ligaments bilaterally. A bladder flap was created. It was difficult to get the bladder down. The uterine arteries were taken down in multiple bites with the harmonic scalpel. Decision was made to proceed vaginally due to the difficulty in getting the bladder down. Attention was turned to the vagina.  The Rumi was removed.  Cervix grasped with Layla Barter tenaculum. Dilute Vasopressin injected paracervically. An incision was made around the cuff and blunt and sharp dissection used to dissect the vagina off the cervix.  Peritoneal cavity entered sharply posteriorly and the peritoneum attached to the posterior cuff. Uterosacral ligaments were clamped cut and suture ligated. The uterus was flipped.  The anterior peritoneal cavity entered sharply. Uterus removed with few more suture ligatures.The vaginal cuff was closed with locked running suture incorporating the uterosacral ligaments. Attention was returned above and a look back showed no bleeding. Laparoscopic instruments removed.  Pneumoperitoneum let down.Marland KitchenHorizontal mattress used to close site. Dermabond was placed over the incisions. 1 inch vaginal packing with Estrace placed in the vagina. All instruments, needle and sponge counts were correct x 2.  Patient was awakened taken to recovery room in stable condition.  Santiago Stenzel S MD 05/10/2014, 10:10 AM

## 2014-05-11 ENCOUNTER — Encounter (HOSPITAL_COMMUNITY): Payer: Self-pay | Admitting: Family Medicine

## 2014-05-11 ENCOUNTER — Other Ambulatory Visit: Payer: Self-pay | Admitting: Family Medicine

## 2014-05-11 DIAGNOSIS — N802 Endometriosis of fallopian tube: Secondary | ICD-10-CM | POA: Diagnosis not present

## 2014-05-11 LAB — CBC
HCT: 37.6 % (ref 36.0–46.0)
HEMOGLOBIN: 13 g/dL (ref 12.0–15.0)
MCH: 32.5 pg (ref 26.0–34.0)
MCHC: 34.6 g/dL (ref 30.0–36.0)
MCV: 94 fL (ref 78.0–100.0)
PLATELETS: 250 10*3/uL (ref 150–400)
RBC: 4 MIL/uL (ref 3.87–5.11)
RDW: 12.1 % (ref 11.5–15.5)
WBC: 9.9 10*3/uL (ref 4.0–10.5)

## 2014-05-11 MED ORDER — OXYCODONE-ACETAMINOPHEN 5-325 MG PO TABS: 1.0000 | ORAL_TABLET | ORAL | Status: DC | PRN

## 2014-05-11 NOTE — Discharge Summary (Signed)
Physician Discharge Summary  Patient ID: Gabrielle Dominguez MRN: [id number] DOB/AGE: 12/29/1975 38 y.o.  Admit date: 05/10/2014 Discharge date:   Admission Diagnoses:  Principal Problem:   History of endometriosis Active Problems:   S/P laparoscopic assisted vaginal hysterectomy (LAVH)   Discharge Diagnoses:  Same  Past Medical History  Diagnosis Date  . History of endometriosis   . Abnormal Pap smear   . Depression with anxiety   . Worsening headaches   . Hypothyroidism   . History of chicken pox   . History of pneumonia 07/2010, 04/2011  . IBS (irritable bowel syndrome)   . Anxiety   . Depression     Surgeries: Procedure(s): BILATERAL SALPINGECTOMY LAPAROSCOPIC ASSISTED VAGINAL HYSTERECTOMY on 05/10/2014   Consultants:  None  Discharged Condition: Improved  Hospital Course: Gabrielle Dominguez is an 38 y.o. female [id number] who was admitted 05/10/2014 with a chief complaint of No chief complaint on file. , and found to have a diagnosis of History of endometriosis.  They were brought to the operating room on 05/10/2014 and underwent the above named procedures.    They were given perioperative antibiotics:  Anti-infectives   Start     Dose/Rate Route Frequency Ordered Stop   05/10/14 0600  gentamicin (GARAMYCIN) 370 mg, clindamycin (CLEOCIN) 900 mg in dextrose 5 % 100 mL IVPB     230.5 mL/hr over 30 Minutes Intravenous On call to O.R. 05/09/14 1338 05/10/14 0731    .  They were given sequential compression devices, early ambulation, and chemoprophylaxis for DVT prophylaxis.  They benefited maximally from their hospital stay and there were no complications.    Recent vital signs:  Filed Vitals:   05/11/14 0600  BP: 108/63  Pulse: 59  Temp: 98 F (36.7 C)  Resp: 16  Discharge exam:  Gen: WD/WN, NAD Abdomen: soft, minimally tender Incision are clean and dry Vagina--packing out  Recent laboratory studies:  Results for orders placed during the hospital  encounter of 05/10/14  CBC      Result Value Ref Range   WBC 9.9  4.0 - 10.5 K/uL   RBC 4.00  3.87 - 5.11 MIL/uL   Hemoglobin 13.0  12.0 - 15.0 g/dL   HCT 37.6  36.0 - 46.0 %   MCV 94.0  78.0 - 100.0 fL   MCH 32.5  26.0 - 34.0 pg   MCHC 34.6  30.0 - 36.0 g/dL   RDW 12.1  11.5 - 15.5 %   Platelets 250  150 - 400 K/uL    Discharge Medications:     Medication List         ALPRAZolam 0.5 MG tablet  Commonly known as:  XANAX  Take 1 tablet (0.5 mg total) by mouth as directed. 0.5-1 tablet twice daily as needed for anxiety. **Sedation precautions**     fish oil-omega-3 fatty acids 1000 MG capsule  Take 2 g by mouth daily.     levothyroxine 88 MCG tablet  Commonly known as:  SYNTHROID, LEVOTHROID  TAKE 1 TABLET BY MOUTH EVERY DAY.     levothyroxine 88 MCG tablet  Commonly known as:  SYNTHROID, LEVOTHROID  TAKE 1 TABLET BY MOUTH EVERY DAY.     OVER THE COUNTER MEDICATION  thermoplus from [place] by Elmon Else     oxyCODONE-acetaminophen 5-325 MG per tablet  Commonly known as:  PERCOCET/ROXICET  Take 1-2 tablets by mouth every 4 (four) hours as needed for severe pain (  moderate to severe pain (when tolerating fluids)).         Disposition: discharge home      Discharge Instructions   Call MD for:  persistant nausea and vomiting    Complete by:  As directed      Call MD for:  severe uncontrolled pain    Complete by:  As directed      Call MD for:  temperature >100.4    Complete by:  As directed      Diet - low sodium heart healthy    Complete by:  As directed      Increase activity slowly    Complete by:  As directed      Lifting restrictions    Complete by:  As directed   Nothing > 20 lbs x 2-4 wks     Sexual Activity Restrictions    Complete by:  As directed   Nothing in the vagina x 4-6 wks           Follow-up Information   Follow up with [facility] In 2 weeks. (postop check,  they will call with appointment)    Specialty:  Obstetrics and Gynecology   Contact information:   [place] [id number] [phone]       Signed: Donnamae Jude 05/11/2014, 8:13 AM

## 2014-05-11 NOTE — Discharge Instructions (Signed)
Laparoscopically Assisted Vaginal Hysterectomy  A laparoscopically assisted vaginal hysterectomy (LAVH) is a surgical procedure to remove the uterus and cervix, and sometimes the ovaries and fallopian tubes. During an LAVH, some of the surgical removal is done through the vagina, and the rest is done through a few small surgical cuts (incisions) in the abdomen.  This procedure is usually considered in women when a vaginal hysterectomy is not an option. Your health care provider will discuss the risks and benefits of the different surgical techniques at your appointment. Generally, recovery time is faster and there are fewer complications after laparoscopic procedures than after open incisional procedures. LET YOUR HEALTH CARE PROVIDER KNOW ABOUT:   Any allergies you have.  All medicines you are taking, including vitamins, herbs, eye drops, creams, and over-the-counter medicines.  Previous problems you or members of your family have had with the use of anesthetics.  Any blood disorders you have.  Previous surgeries you have had.  Medical conditions you have. RISKS AND COMPLICATIONS Generally, this is a safe procedure. However, as with any procedure, complications can occur. Possible complications include:  Allergies to medicines.  Difficulty breathing.  Bleeding.  Infection.  Damage to other structures near your uterus and cervix. BEFORE THE PROCEDURE  Ask your health care provider about changing or stopping your regular medicines.  Take certain medicines, such as a colon-emptying preparation, as directed.  Do not eat or drink anything for at least 8 hours before your surgery.  Stop smoking if you smoke. Stopping will improve your health after surgery.  Arrange for a ride home after surgery and for help at home during recovery. PROCEDURE   An IV tube will be put into one of your veins in order to give you fluids and medicines.  You will receive medicines to relax you and  medicines that make you sleep (general anesthetic).  You may have a flexible tube (catheter) put into your bladder to drain urine.  You may have a tube put through your nose or mouth that goes into your stomach (nasogastric tube). The nasogastric tube removes digestive fluids and prevents you from feeling nauseated and from vomiting.  Tight-fitting (compression) stockings will be placed on your legs to promote circulation.  Three to four small incisions will be made in your abdomen. An incision also will be made in your vagina. Probes and tools will be inserted into the small incisions. The uterus and cervix are removed (and possibly your ovaries and fallopian tubes) through your vagina as well as through the small incisions that were made in the abdomen.  Your vagina is then sewn back to normal. AFTER THE PROCEDURE  You may have a liquid diet temporarily. You will most likely return to, and tolerate, your usual diet the day after surgery.  You will be passing urine through a catheter. It will be removed the day after surgery.  Your temperature, breathing rate, heart rate, blood pressure, and oxygen level will be monitored regularly.  You will still wear compression stockings on your legs until you are able to move around.  You will use a special device or do breathing exercises to keep your lungs clear.  You will be encouraged to walk as soon as possible. Document Released: 06/20/2011 Document Revised: 03/03/2013 Document Reviewed: 01/14/2013 ExitCare Patient Information 2015 [employer]. This information is not intended to replace advice given to you by your health care provider. Make sure you discuss any questions you have with your health care provider.  

## 2014-05-11 NOTE — Progress Notes (Signed)
Discharge teaching complete. Pt understood all instructions and did not have any questions. Pain medicine given before pt discharged and IV discontinues. Pt pushed via wheelchair and discharged home to family.

## 2014-05-13 ENCOUNTER — Telehealth: Payer: Self-pay | Admitting: *Deleted

## 2014-05-13 DIAGNOSIS — R110 Nausea: Secondary | ICD-10-CM

## 2014-05-13 MED ORDER — PROMETHAZINE HCL 25 MG PO TABS: 25.0000 mg | ORAL_TABLET | Freq: Four times a day (QID) | ORAL | Status: DC | PRN

## 2014-05-13 NOTE — Telephone Encounter (Signed)
Pt mother called and patient is having nausea when she tries to eat.  Patient had a hysterectomy this week. Pt. Would like something called in.   I have sent in Phenergan to patients pharmacy okay per Dr. Harolyn Rutherford.

## 2014-05-16 ENCOUNTER — Encounter (HOSPITAL_COMMUNITY): Payer: Self-pay | Admitting: Family Medicine

## 2014-05-27 ENCOUNTER — Ambulatory Visit (INDEPENDENT_AMBULATORY_CARE_PROVIDER_SITE_OTHER): Payer: PRIVATE HEALTH INSURANCE | Admitting: Family Medicine

## 2014-05-27 ENCOUNTER — Encounter: Payer: Self-pay | Admitting: Family Medicine

## 2014-05-27 VITALS — BP 114/87 | HR 70 | Wt 217.0 lb

## 2014-05-27 DIAGNOSIS — Z09 Encounter for follow-up examination after completed treatment for conditions other than malignant neoplasm: Secondary | ICD-10-CM

## 2014-05-27 NOTE — Progress Notes (Signed)
    Subjective:    Patient ID: Gabrielle Dominguez is a 38 y.o. female presenting with Routine Post Op  on 05/27/2014  HPI: Reports some urgency and pain after urination. Some pulling at incisions.  Review of Systems  Constitutional: Negative for fever and chills.  Respiratory: Negative for shortness of breath.   Cardiovascular: Negative for chest pain.  Gastrointestinal: Negative for nausea, vomiting and abdominal pain.  Genitourinary: Positive for dysuria.  Skin: Negative for rash.      Objective:    BP 114/87 mmHg  Pulse 70  Wt 217 lb (98.431 kg)  LMP 09/12/2011 Physical Exam  Constitutional: She is oriented to person, place, and time. She appears well-developed and well-nourished. No distress.  HENT:  Head: Normocephalic and atraumatic.  Eyes: No scleral icterus.  Neck: Neck supple.  Cardiovascular: Normal rate.   Pulmonary/Chest: Effort normal.  Abdominal: Soft.  Genitourinary: Vagina normal.  Healing well  Neurological: She is alert and oriented to person, place, and time.  Skin: Skin is warm and dry.  Psychiatric: She has a normal mood and affect.        Assessment & Plan:   Postop check doing well--f/u 2-3 wks for re-check and determination of return to work.   Return in about 4 weeks (around 06/24/2014) for postop check.

## 2014-05-27 NOTE — Patient Instructions (Signed)
Laparoscopically Assisted Vaginal Hysterectomy  A laparoscopically assisted vaginal hysterectomy (LAVH) is a surgical procedure to remove the uterus and cervix, and sometimes the ovaries and fallopian tubes. During an LAVH, some of the surgical removal is done through the vagina, and the rest is done through a few small surgical cuts (incisions) in the abdomen.  This procedure is usually considered in women when a vaginal hysterectomy is not an option. Your health care provider will discuss the risks and benefits of the different surgical techniques at your appointment. Generally, recovery time is faster and there are fewer complications after laparoscopic procedures than after open incisional procedures. LET YOUR HEALTH CARE PROVIDER KNOW ABOUT:   Any allergies you have.  All medicines you are taking, including vitamins, herbs, eye drops, creams, and over-the-counter medicines.  Previous problems you or members of your family have had with the use of anesthetics.  Any blood disorders you have.  Previous surgeries you have had.  Medical conditions you have. RISKS AND COMPLICATIONS Generally, this is a safe procedure. However, as with any procedure, complications can occur. Possible complications include:  Allergies to medicines.  Difficulty breathing.  Bleeding.  Infection.  Damage to other structures near your uterus and cervix. BEFORE THE PROCEDURE  Ask your health care provider about changing or stopping your regular medicines.  Take certain medicines, such as a colon-emptying preparation, as directed.  Do not eat or drink anything for at least 8 hours before your surgery.  Stop smoking if you smoke. Stopping will improve your health after surgery.  Arrange for a ride home after surgery and for help at home during recovery. PROCEDURE   An IV tube will be put into one of your veins in order to give you fluids and medicines.  You will receive medicines to relax you and  medicines that make you sleep (general anesthetic).  You may have a flexible tube (catheter) put into your bladder to drain urine.  You may have a tube put through your nose or mouth that goes into your stomach (nasogastric tube). The nasogastric tube removes digestive fluids and prevents you from feeling nauseated and from vomiting.  Tight-fitting (compression) stockings will be placed on your legs to promote circulation.  Three to four small incisions will be made in your abdomen. An incision also will be made in your vagina. Probes and tools will be inserted into the small incisions. The uterus and cervix are removed (and possibly your ovaries and fallopian tubes) through your vagina as well as through the small incisions that were made in the abdomen.  Your vagina is then sewn back to normal. AFTER THE PROCEDURE  You may have a liquid diet temporarily. You will most likely return to, and tolerate, your usual diet the day after surgery.  You will be passing urine through a catheter. It will be removed the day after surgery.  Your temperature, breathing rate, heart rate, blood pressure, and oxygen level will be monitored regularly.  You will still wear compression stockings on your legs until you are able to move around.  You will use a special device or do breathing exercises to keep your lungs clear.  You will be encouraged to walk as soon as possible. Document Released: 06/20/2011 Document Revised: 03/03/2013 Document Reviewed: 01/14/2013 ExitCare Patient Information 2015 [employer]. This information is not intended to replace advice given to you by your health care provider. Make sure you discuss any questions you have with your health care provider.  

## 2014-05-31 ENCOUNTER — Telehealth: Payer: Self-pay | Admitting: *Deleted

## 2014-05-31 MED ORDER — OXYCODONE-ACETAMINOPHEN 5-325 MG PO TABS: 1.0000 | ORAL_TABLET | ORAL | Status: DC | PRN

## 2014-05-31 NOTE — Addendum Note (Signed)
Addended by: Donnamae Jude on: 05/31/2014 11:48 AM   Modules accepted: Orders

## 2014-05-31 NOTE — Telephone Encounter (Signed)
Patient is requesting a refill of her pain medication.   She is still having some pain and is mainly only needing to use sometimes at the end of the day.

## 2014-06-08 ENCOUNTER — Other Ambulatory Visit (INDEPENDENT_AMBULATORY_CARE_PROVIDER_SITE_OTHER): Payer: PRIVATE HEALTH INSURANCE | Admitting: *Deleted

## 2014-06-08 DIAGNOSIS — R309 Painful micturition, unspecified: Secondary | ICD-10-CM

## 2014-06-08 LAB — POCT URINALYSIS DIPSTICK
BILIRUBIN UA: NEGATIVE
Glucose, UA: NEGATIVE
KETONES UA: NEGATIVE
LEUKOCYTES UA: NEGATIVE
Nitrite, UA: NEGATIVE
PH UA: 6
Protein, UA: NEGATIVE
Spec Grav, UA: 1.025
Urobilinogen, UA: NEGATIVE

## 2014-06-08 NOTE — Progress Notes (Signed)
Patient is having some increased discomfort with urination.  Her Urine dip is positive for a small amount of blood but otherwise within normal limits.  I will send it for culture to be safe and she will call back if her symptoms change or worsen.

## 2014-06-10 LAB — URINE CULTURE
Colony Count: NO GROWTH
Organism ID, Bacteria: NO GROWTH

## 2014-06-17 ENCOUNTER — Ambulatory Visit: Payer: PRIVATE HEALTH INSURANCE | Admitting: Family Medicine

## 2014-06-28 ENCOUNTER — Ambulatory Visit (INDEPENDENT_AMBULATORY_CARE_PROVIDER_SITE_OTHER): Payer: PRIVATE HEALTH INSURANCE | Admitting: Family Medicine

## 2014-06-28 ENCOUNTER — Encounter: Payer: Self-pay | Admitting: Family Medicine

## 2014-06-28 VITALS — BP 120/82 | HR 75 | Wt 221.6 lb

## 2014-06-28 DIAGNOSIS — Z90710 Acquired absence of both cervix and uterus: Secondary | ICD-10-CM

## 2014-06-28 DIAGNOSIS — Z09 Encounter for follow-up examination after completed treatment for conditions other than malignant neoplasm: Secondary | ICD-10-CM

## 2014-06-28 NOTE — Progress Notes (Signed)
    Subjective:    Patient ID: Gabrielle Dominguez is a 38 y.o. female presenting with Routine Post Op  on 06/28/2014  HPI: Reports doing well since surgery.  Has not had any endometriosis pain since surgery.  Has had 2 hot flashes since surgery. Back at work.  Review of Systems  Constitutional: Negative for fever and chills.  Respiratory: Negative for shortness of breath.   Cardiovascular: Negative for chest pain.  Gastrointestinal: Negative for nausea, vomiting and abdominal pain.  Genitourinary: Negative for dysuria.  Skin: Negative for rash.      Objective:     Physical Exam  Constitutional: She is oriented to person, place, and time. She appears well-developed and well-nourished. No distress.  HENT:  Head: Normocephalic and atraumatic.  Eyes: No scleral icterus.  Neck: Neck supple.  Cardiovascular: Normal rate.   Pulmonary/Chest: Effort normal.  Abdominal: Soft.  Neurological: She is alert and oriented to person, place, and time.  Skin: Skin is warm and dry.  Psychiatric: She has a normal mood and affect.  Vitals reviewed.       Assessment & Plan:   Problem List Items Addressed This Visit      Unprioritized   S/P laparoscopic assisted vaginal hysterectomy (LAVH) - Primary    Doing well--f/u prn         Return in about 3 months (around 09/27/2014).

## 2014-06-28 NOTE — Assessment & Plan Note (Signed)
Doing well--f/u prn

## 2014-06-28 NOTE — Patient Instructions (Signed)
Endometriosis Endometriosis is a condition in which the tissue that lines the uterus (endometrium) grows outside of its normal location. The tissue may grow in many locations close to the uterus, but it commonly grows on the ovaries, fallopian tubes, vagina, or bowel. Because the uterus expels, or sheds, its lining every menstrual cycle, there is bleeding wherever the endometrial tissue is located. This can cause pain because blood is irritating to tissues not normally exposed to it.  CAUSES  The cause of endometriosis is not known.  SIGNS AND SYMPTOMS  Often, there are no symptoms. When symptoms are present, they can vary with the location of the displaced tissue. Various symptoms can occur at different times. Although symptoms occur mainly during a woman's menstrual period, they can also occur midcycle and usually stop with menopause. Some people may go months with no symptoms at all. Symptoms may include:   Back or abdominal pain.   Heavier bleeding during periods.   Pain during intercourse.   Painful bowel movements.   Infertility. DIAGNOSIS  Your health care provider will do a physical exam and ask about your symptoms. Various tests may be done, such as:   Blood tests and urine tests. These are done to help rule out other problems.   Ultrasound. This test is done to look for abnormal tissue.   An X-ray of the lower bowel (barium enema).  Laparoscopy. In this procedure, a thin, lighted tube with a tiny camera on the end (laparoscope) is inserted into your abdomen. This helps your health care provider look for abnormal tissue to confirm the diagnosis. The health care provider may also remove a small piece of tissue (biopsy) from any abnormal tissue found. This tissue sample can then be sent to a lab so it can be looked at under a microscope. TREATMENT  Treatment will vary and may include:   Medicines to relieve pain. Nonsteroidal anti-inflammatory drugs (NSAIDs) are a type of  pain medicine that can help to relieve the pain caused by endometriosis.  Hormonal therapy. When using hormonal therapy, periods are eliminated. This eliminates the monthly exposure to blood by the displaced endometrial tissue.   Surgery. Surgery may sometimes be done to remove the abnormal endometrial tissue. In severe cases, surgery may be done to remove the fallopian tubes, uterus, and ovaries (hysterectomy). HOME CARE INSTRUCTIONS   Take all medicines as directed by your health care provider. Do not take aspirin because it may increase bleeding when you are not on hormonal therapy.   Avoid activities that produce pain, including sexual activity. SEEK MEDICAL CARE IF:  You have pelvic pain before, after, or during your periods.  You have pelvic pain between periods that gets worse during your period.  You have pelvic pain during or after sex.  You have pelvic pain with bowel movements or urination, especially during your period.  You have problems getting pregnant.  You have a fever. SEEK IMMEDIATE MEDICAL CARE IF:   Your pain is severe and is not responding to pain medicine.   You have severe nausea and vomiting, or you cannot keep foods down.   You have pain that is limited to the right lower part of your abdomen.   You have swelling or increasing pain in your abdomen.   You see blood in your stool.  MAKE SURE YOU:   Understand these instructions.  Will watch your condition.  Will get help right away if you are not doing well or get worse. Document Released: 06/28/2000 Document  Revised: 11/15/2013 Document Reviewed: 02/26/2013 ExitCare Patient Information 2015 [employer]. This information is not intended to replace advice given to you by your health care provider. Make sure you discuss any questions you have with your health care provider.  

## 2014-07-16 ENCOUNTER — Other Ambulatory Visit: Payer: Self-pay | Admitting: Family Medicine

## 2014-08-26 ENCOUNTER — Encounter: Payer: Self-pay | Admitting: Family Medicine

## 2014-09-11 ENCOUNTER — Other Ambulatory Visit: Payer: Self-pay | Admitting: Family Medicine

## 2014-09-12 ENCOUNTER — Telehealth: Payer: Self-pay | Admitting: Family Medicine

## 2014-09-12 NOTE — Telephone Encounter (Signed)
Noted.  We can continue prescribing here as long as we are on a stable dose.  If change needed may need to refer back to psych. Can we get latest note from Dr Nicolasa Ducking? I have first one from 08/25/2014.

## 2014-09-12 NOTE — Telephone Encounter (Signed)
Is currently on Depakote 750 mg QHS per Dr. Nicolasa Ducking. She cannot afford to continue the visits because of her insurance, but knows she will need labs and refills eventually. She asks if you will be willing to take over prescribing and monitoring since you were originally going to prescribe it for her anyway. Dx with Bipolar 2. She has also been referred to a psychologist for some counseling sessions, but doesn't know how long she can go to them.

## 2014-09-12 NOTE — Telephone Encounter (Signed)
Pt called and wanted to talk to [place] Regarding   She was referred to a psychiatrist It cost $80 every time she goes They prescribed depoket.  She would like to talk about the medication

## 2014-09-13 NOTE — Telephone Encounter (Signed)
Message left notifying patient and notes requested.

## 2014-09-14 NOTE — Telephone Encounter (Addendum)
Spoke with patient. She said that she hasn't been back to Dr. Nicolasa Ducking because she can't afford to. She had her initially on 1250mg  and then decreased her to 750mg . She hasn't had labs since the decrease. Does she need to follow up with you now for labs or can she wait? She said she is feeling fine presently.

## 2014-09-15 NOTE — Telephone Encounter (Addendum)
In that case would recommend at least one more f/u visit with Dr Nicolasa Ducking to ensure stable on current dose then may f/u here as needed.

## 2014-09-16 NOTE — Telephone Encounter (Signed)
Patient notified and verbalized understanding. 

## 2014-09-19 ENCOUNTER — Encounter: Payer: Self-pay | Admitting: Family Medicine

## 2014-09-19 ENCOUNTER — Ambulatory Visit (INDEPENDENT_AMBULATORY_CARE_PROVIDER_SITE_OTHER): Payer: PRIVATE HEALTH INSURANCE | Admitting: Family Medicine

## 2014-09-19 VITALS — BP 128/82 | HR 101 | Temp 101.0°F | Wt 228.8 lb

## 2014-09-19 DIAGNOSIS — R69 Illness, unspecified: Principal | ICD-10-CM

## 2014-09-19 DIAGNOSIS — J111 Influenza due to unidentified influenza virus with other respiratory manifestations: Secondary | ICD-10-CM

## 2014-09-19 LAB — POCT INFLUENZA A/B
Influenza A, POC: NEGATIVE
Influenza B, POC: NEGATIVE

## 2014-09-19 MED ORDER — GUAIFENESIN-CODEINE 100-10 MG/5ML PO SYRP: 5.0000 mL | ORAL_SOLUTION | Freq: Every evening | ORAL | Status: DC | PRN

## 2014-09-19 MED ORDER — OSELTAMIVIR PHOSPHATE 75 MG PO CAPS: 75.0000 mg | ORAL_CAPSULE | Freq: Two times a day (BID) | ORAL | Status: DC

## 2014-09-19 NOTE — Addendum Note (Signed)
Addended by: Jacqualin Combes on: 09/19/2014 05:20 PM   Modules accepted: Orders

## 2014-09-19 NOTE — Assessment & Plan Note (Addendum)
Flu like illness that started within last 48 hours. Flu swab negative Regardless given sxs discussed presumptive influenza dx.  Discussed tamiflu despite negative flu swab - pt interested in this. Will treat cough with cheratussin. Update if not improving as expected. Red flags to seek care discussed.

## 2014-09-19 NOTE — Progress Notes (Signed)
Pre visit review using our clinic review tool, if applicable. No additional management support is needed unless otherwise documented below in the visit note. 

## 2014-09-19 NOTE — Progress Notes (Addendum)
BP 128/82 mmHg  Pulse 101  Temp(Src) 101 F (38.3 C) (Oral)  Wt 228 lb 12.8 oz (103.783 kg)  SpO2 96%  LMP 09/12/2011   CC: URI sxs  Subjective:    Patient ID: Gabrielle Dominguez, female    DOB: 1975-11-11, 39 y.o.   MRN: [id number]  HPI: Gabrielle Dominguez is a 39 y.o. female presenting on 09/19/2014 for Cough; Sore Throat; Chills; and Nasal Congestion   Started feeling bad 2 days ago. Started with cough, body aches, worse yesterday and today.  + significant body aches. Dry hacking cough. Ear pain and ST.   No headaches, tooth pain, PNdrainage, abd pain, nausea.  So far has tried Copywriter, advertising plus cold/flu, dayquil.  51 yo son recently sick with stomach virus.  No h/o asthma. + h/o PNA x2.  Relevant past medical, surgical, family and social history reviewed and updated as indicated. Interim medical history since our last visit reviewed. Allergies and medications reviewed and updated. Current Outpatient Prescriptions on File Prior to Visit  Medication Sig  . ALPRAZolam (XANAX) 0.5 MG tablet Take 1 tablet (0.5 mg total) by mouth as directed. 0.5-1 tablet twice daily as needed for anxiety. **Sedation precautions**  . fish oil-omega-3 fatty acids 1000 MG capsule Take 2 g by mouth daily.  Marland Kitchen levothyroxine (SYNTHROID, LEVOTHROID) 88 MCG tablet TAKE 1 TABLET BY MOUTH EVERY DAY.  Marland Kitchen levothyroxine (SYNTHROID, LEVOTHROID) 88 MCG tablet TAKE 1 TABLET BY MOUTH EVERY DAY.  . methadone (DOLOPHINE) 5 MG tablet Take 5 mg by mouth 2 (two) times daily.  Marland Kitchen OVER THE COUNTER MEDICATION thermoplus from [place]  . OVER THE COUNTER MEDICATION Catalyst by Elmon Else   No current facility-administered medications on file prior to visit.    Review of Systems Per HPI unless specifically indicated above     Objective:    BP 128/82 mmHg  Pulse 101  Temp(Src) 101 F (38.3 C) (Oral)  Wt 228 lb 12.8 oz (103.783 kg)  SpO2 96%  LMP 09/12/2011  Wt Readings from Last 3 Encounters:  09/19/14 228 lb  12.8 oz (103.783 kg)  06/28/14 221 lb 9.6 oz (100.517 kg)  05/27/14 217 lb (98.431 kg)    Physical Exam  Constitutional: She appears well-developed and well-nourished. No distress.  Tired appearing, feverish  HENT:  Head: Normocephalic and atraumatic.  Right Ear: Hearing, tympanic membrane, external ear and ear canal normal.  Left Ear: Hearing, tympanic membrane, external ear and ear canal normal.  Nose: Mucosal edema (injected turbinates) present. No rhinorrhea. Right sinus exhibits no maxillary sinus tenderness and no frontal sinus tenderness. Left sinus exhibits no maxillary sinus tenderness and no frontal sinus tenderness.  Mouth/Throat: Uvula is midline and mucous membranes are normal. Posterior oropharyngeal erythema present. No oropharyngeal exudate, posterior oropharyngeal edema or tonsillar abscesses.  Eyes: Conjunctivae and EOM are normal. Pupils are equal, round, and reactive to light. No scleral icterus.  Neck: Normal range of motion. Neck supple.  Cardiovascular: Normal rate, regular rhythm, normal heart sounds and intact distal pulses.   No murmur heard. Pulmonary/Chest: Effort normal and breath sounds normal. No respiratory distress. She has no wheezes. She has no rales.  Lymphadenopathy:    She has cervical adenopathy (R AC LAD).  Skin: Skin is warm and dry. No rash noted.  Nursing note and vitals reviewed.  Given 600mg  ibuprofen in office.    Assessment & Plan:   Problem List Items Addressed This Visit    Influenza-like illness - Primary  Flu like illness that started within last 48 hours. Flu swab negative Regardless given sxs discussed presumptive influenza dx.  Discussed tamiflu despite negative flu swab - pt interested in this. Will treat cough with cheratussin. Update if not improving as expected. Red flags to seek care discussed.          Follow up plan: Return if symptoms worsen or fail to improve.

## 2014-09-19 NOTE — Patient Instructions (Signed)

## 2014-09-19 NOTE — Addendum Note (Signed)
Addended by: Ria Bush on: 09/19/2014 05:12 PM   Modules accepted: Miquel Dunn

## 2014-09-20 ENCOUNTER — Telehealth: Payer: Self-pay

## 2014-09-20 ENCOUNTER — Ambulatory Visit (INDEPENDENT_AMBULATORY_CARE_PROVIDER_SITE_OTHER)
Admission: RE | Admit: 2014-09-20 | Discharge: 2014-09-20 | Disposition: A | Discharge status: Home / Self Care | Payer: PRIVATE HEALTH INSURANCE | Source: Ambulatory Visit | Attending: Primary Care | Admitting: Primary Care

## 2014-09-20 ENCOUNTER — Telehealth: Payer: Self-pay | Admitting: Primary Care

## 2014-09-20 ENCOUNTER — Ambulatory Visit (INDEPENDENT_AMBULATORY_CARE_PROVIDER_SITE_OTHER): Payer: PRIVATE HEALTH INSURANCE | Admitting: Primary Care

## 2014-09-20 ENCOUNTER — Encounter: Payer: Self-pay | Admitting: Primary Care

## 2014-09-20 VITALS — BP 110/70 | HR 80 | Temp 98.7°F | Wt 218.0 lb

## 2014-09-20 DIAGNOSIS — R05 Cough: Secondary | ICD-10-CM

## 2014-09-20 DIAGNOSIS — J029 Acute pharyngitis, unspecified: Secondary | ICD-10-CM

## 2014-09-20 DIAGNOSIS — R059 Cough, unspecified: Secondary | ICD-10-CM | POA: Insufficient documentation

## 2014-09-20 LAB — POCT RAPID STREP A (OFFICE): Rapid Strep A Screen: NEGATIVE

## 2014-09-20 NOTE — Assessment & Plan Note (Signed)
Rapid strep negative. Pharynx unremarkable. Continue tylenol or ibuprofen for pain. Warm salt gargles, OTC chloraseptic spray as needed. Call if no improvement or worsening symptoms in 2 to 3 days.

## 2014-09-20 NOTE — Telephone Encounter (Signed)
Updated patient that xray was clear and negative for infection/pneumonia. Advised to continue cough medication, fluids, rest as discussed. She is to call if no improvement or worsening symptoms in three days.

## 2014-09-20 NOTE — Telephone Encounter (Signed)
Pt's mother called; pt was seen 09/19/14 and when pt went to pick up tamiflu cost to pt was $100.00 so pt did not get Tamiflu. This AM pt's throat is extremely swollen and painful; pt can hardly swallow her saliva. pts mother is on her way to pts home and pt did not mention difficulty breathing. Pt temp still 102. Pts mother request abx to [facility]. Call pt back. Dr Darnell Level is not in office yet and spoke with Dr Damita Dunnings and he advised pt needed to be rechecked,? Strep test and if pt condition changes or worsens or if pt has difficulty breathing pt to go to ED. Spoke with pt and she is not having difficulty breathing and pt scheduled appt today at 10 Am with Allie Bossier NP.

## 2014-09-20 NOTE — Progress Notes (Signed)
Pre visit review using our clinic review tool, if applicable. No additional management support is needed unless otherwise documented below in the visit note. 

## 2014-09-20 NOTE — Assessment & Plan Note (Addendum)
Cough present, lungs diminished to bases during exam.  Will obtain xray to rule out pneumonia. I suspect this is viral in nature due to symptoms and presentation, but will provide antibiotic if pneumonia present. Discussed to continue cough medication as previously prescribed, drink fluids, tylenol for fevers and body aches, and if not feeling better in 3 days to call.

## 2014-09-20 NOTE — Progress Notes (Signed)
Subjective:    Patient ID: Gabrielle Dominguez, female    DOB: [birth year], 39 y.o.   MRN: [id number]  HPI  Gabrielle Dominguez is a 39 year old female who presents today with a chief complaint of fever, body aches, chills, sore throat (tickle and dry), and non productive cough since Sunday this week.  Her sore throat has worsened starting yesterday along with her cough and some mild shortness of breath. She was seen yesterday by another provider with the same complaints and had a negative rapid flu test. She was given a prescription for Tamiflu but did not fill it due to cost. She has been taking motrin, dayquil, alka selzer plus with minimal relief with the exception of Tylenol which has helped with fever and body aches. She reports a history of pneumonia twice in 2012. Nothing has helped to dissipate her symptoms, and coughing makes chest and throat pain worse.  Review of Systems  Constitutional: Positive for fever, chills and fatigue.  HENT: Positive for postnasal drip, rhinorrhea, sinus pressure and sore throat. Negative for ear pain.   Eyes: Positive for itching.  Respiratory: Positive for cough and shortness of breath.        Chest wall pain when coughing.  Cardiovascular: Negative for chest pain.  Gastrointestinal: Negative for nausea and vomiting.  Skin: Negative for rash.  Neurological: Positive for dizziness and headaches.  Hematological: Positive for adenopathy.       Past Medical History  Diagnosis Date  . History of endometriosis   . Abnormal Pap smear   . Worsening headaches   . Hypothyroidism   . History of chicken pox   . History of pneumonia 07/2010, 04/2011  . IBS (irritable bowel syndrome)   . Panic disorder   . Bipolar 2 disorder     History   Social History  . Marital Status: Married    Spouse Name: N/A  . Number of Children: 1  . Years of Education: N/A   Occupational History  . office manager    Social History Main Topics  . Smoking status: Former  Smoker    Types: Cigarettes    Quit date: 07/15/2002  . Smokeless tobacco: Never Used  . Alcohol Use: 0.0 oz/week    0 Standard drinks or equivalent per week     Comment: rarely- social  . Drug Use: No  . Sexual Activity:    Partners: Male    Patent examiner Protection: None   Other Topics Concern  . Not on file   Social History Narrative   Caffeine: rarely   Lives with fiance, son (2000), mother, 1 dog   Occupation: unemployed   Edu: 12th grade   Activity: tries to walk   Diet: occasional fast food, lots of water, good fruits and vegetables, red meat 3x/wk    Past Surgical History  Procedure Laterality Date  . Laparoscopic endometriosis fulguration  2002 and 2009  . Cesarean section  2000  . Colonoscopy  10/2012    normal, mult random biopsies obtained, normal Fuller Plan)  . Bilateral salpingectomy N/A 05/10/2014    Procedure: BILATERAL SALPINGECTOMY;  Surgeon: Donnamae Jude, MD;  Location: [facility];  Service: Gynecology;  Laterality: N/A;  . Laparoscopic assisted vaginal hysterectomy N/A 05/10/2014    Procedure: LAPAROSCOPIC ASSISTED VAGINAL HYSTERECTOMY;  Surgeon: Donnamae Jude, MD;  Location: [facility];  Service: Gynecology;  Laterality: N/A;    Family History  Problem Relation Age of Onset  . Heart attack Paternal Grandfather   .  Hypertension Paternal Grandfather   . Hypertension Maternal Grandmother   . Hypertension Father   . Melanoma Father   . Cancer Mother     vulvar  . Hypertension Mother   . Stroke Mother   . Colon cancer Maternal Aunt 31  . Hypertension Brother   . Stroke Maternal Grandfather   . Melanoma Cousin   . Irritable bowel syndrome Mother   . Diabetes Neg Hx     Allergies  Allergen Reactions  . Penicillins Anaphylaxis and Hives    Current Outpatient Prescriptions on File Prior to Visit  Medication Sig Dispense Refill  . ALPRAZolam (XANAX) 0.5 MG tablet Take 1 tablet (0.5 mg total) by mouth as directed. 0.5-1 tablet twice daily as needed for  anxiety. **Sedation precautions** 30 tablet 0  . fish oil-omega-3 fatty acids 1000 MG capsule Take 2 g by mouth daily.    Marland Kitchen guaiFENesin-codeine (ROBITUSSIN AC) 100-10 MG/5ML syrup Take 5 mLs by mouth at bedtime as needed. 140 mL 0  . levothyroxine (SYNTHROID, LEVOTHROID) 88 MCG tablet TAKE 1 TABLET BY MOUTH EVERY DAY. 90 tablet 3  . OVER THE COUNTER MEDICATION thermoplus from [place]    . OVER THE COUNTER MEDICATION Catalyst by [employer]    . methadone (DOLOPHINE) 5 MG tablet Take 5 mg by mouth 2 (two) times daily.  0  . oseltamivir (TAMIFLU) 75 MG capsule Take 1 capsule (75 mg total) by mouth 2 (two) times daily. (Patient not taking: Reported on 09/20/2014) 10 capsule 0   No current facility-administered medications on file prior to visit.    BP 110/70 mmHg  Pulse 80  Temp(Src) 98.7 F (37.1 C) (Oral)  Wt 218 lb (98.884 kg)  LMP 09/12/2011    Objective:   Physical Exam  Constitutional: She is oriented to person, place, and time. She appears well-developed.  HENT:  Head: Normocephalic.  Right Ear: External ear normal.  Left Ear: External ear normal.  Nose: Nose normal.  Mouth/Throat: Oropharynx is clear and moist. No oropharyngeal exudate.  Eyes: Conjunctivae are normal.  Neck: Neck supple.  Cardiovascular: Normal rate and regular rhythm.   Pulmonary/Chest: Effort normal. She has no wheezes.  Diminished to left base.  Lymphadenopathy:    She has no cervical adenopathy.  Neurological: She is alert and oriented to person, place, and time.  Skin: Skin is warm and dry.  Psychiatric: She has a normal mood and affect.          Assessment & Plan:  Rapid Strep Negative

## 2014-09-20 NOTE — Patient Instructions (Signed)
Obtain chest xray prior to leaving today, we will call you with the results. Continue tylenol for body aches and fevers. Drink plenty of fluids and rest.  Use saline nasal spray as needed. I hope you feel better soon!

## 2014-09-30 ENCOUNTER — Telehealth: Payer: Self-pay | Admitting: Primary Care

## 2014-09-30 ENCOUNTER — Telehealth: Payer: Self-pay | Admitting: Family Medicine

## 2014-09-30 DIAGNOSIS — R059 Cough, unspecified: Secondary | ICD-10-CM

## 2014-09-30 DIAGNOSIS — R05 Cough: Secondary | ICD-10-CM

## 2014-09-30 MED ORDER — BENZONATATE 100 MG PO CAPS: 100.0000 mg | ORAL_CAPSULE | Freq: Three times a day (TID) | ORAL | Status: DC | PRN

## 2014-09-30 NOTE — Telephone Encounter (Signed)
Pt called stating she is feeling better.  But she cannot quit coughing.  The cough medicine that dr g prescribed is not helping.  What would you recommend.  [facility]

## 2014-09-30 NOTE — Telephone Encounter (Signed)
Called patient and notified her of Kate's comments. Patient verbalized understanding.

## 2014-09-30 NOTE — Telephone Encounter (Signed)
Will you please notify Ms. Gabrielle Dominguez that I sent Tessalon Pearls to her pharmacy for her cough. She can take one capsule three times daily as needed.  Thank you! Anda Kraft

## 2014-11-18 ENCOUNTER — Other Ambulatory Visit: Payer: Self-pay | Admitting: Family Medicine

## 2014-11-25 ENCOUNTER — Ambulatory Visit (INDEPENDENT_AMBULATORY_CARE_PROVIDER_SITE_OTHER): Payer: PRIVATE HEALTH INSURANCE | Admitting: Primary Care

## 2014-11-25 ENCOUNTER — Encounter: Payer: Self-pay | Admitting: Primary Care

## 2014-11-25 VITALS — BP 128/82 | HR 78 | Temp 97.9°F | Ht 64.0 in | Wt 210.8 lb

## 2014-11-25 DIAGNOSIS — M62830 Muscle spasm of back: Secondary | ICD-10-CM

## 2014-11-25 MED ORDER — CYCLOBENZAPRINE HCL 5 MG PO TABS: 5.0000 mg | ORAL_TABLET | Freq: Three times a day (TID) | ORAL | Status: DC | PRN

## 2014-11-25 NOTE — Progress Notes (Signed)
Pre visit review using our clinic review tool, if applicable. No additional management support is needed unless otherwise documented below in the visit note. 

## 2014-11-25 NOTE — Patient Instructions (Signed)
You make take Cyclobenzaprine (Flexeril) tablets three times daily as needed for back spasms. These tablets may make you drowsy, so you may want to cut them in half for the first several doses. Continue to take tylenol/ibuprofen as needed. Do not exceed 3000 mg of tylenol or 2300 mg of ibuprofen in 24 hours. Continue to apply heat, do not sleep with heating pad. It was nice meeting you!  Muscle Cramps and Spasms Muscle cramps and spasms occur when a muscle or muscles tighten and you have no control over this tightening (involuntary muscle contraction). They are a common problem and can develop in any muscle. The most common place is in the calf muscles of the leg. Both muscle cramps and muscle spasms are involuntary muscle contractions, but they also have differences:   Muscle cramps are sporadic and painful. They may last a few seconds to a quarter of an hour. Muscle cramps are often more forceful and last longer than muscle spasms.  Muscle spasms may or may not be painful. They may also last just a few seconds or much longer. CAUSES  It is uncommon for cramps or spasms to be due to a serious underlying problem. In many cases, the cause of cramps or spasms is unknown. Some common causes are:   Overexertion.   Overuse from repetitive motions (doing the same thing over and over).   Remaining in a certain position for a long period of time.   Improper preparation, form, or technique while performing a sport or activity.   Dehydration.   Injury.   Side effects of some medicines.   Abnormally low levels of the salts and ions in your blood (electrolytes), especially potassium and calcium. This could happen if you are taking water pills (diuretics) or you are pregnant.  Some underlying medical problems can make it more likely to develop cramps or spasms. These include, but are not limited to:   Diabetes.   Parkinson disease.   Hormone disorders, such as thyroid problems.    Alcohol abuse.   Diseases specific to muscles, joints, and bones.   Blood vessel disease where not enough blood is getting to the muscles.  HOME CARE INSTRUCTIONS   Stay well hydrated. Drink enough water and fluids to keep your urine clear or pale yellow.  It may be helpful to massage, stretch, and relax the affected muscle.  For tight or tense muscles, use a warm towel, heating pad, or hot shower water directed to the affected area.  If you are sore or have pain after a cramp or spasm, applying ice to the affected area may relieve discomfort.  Put ice in a plastic bag.  Place a towel between your skin and the bag.  Leave the ice on for 15-20 minutes, 03-04 times a day.  Medicines used to treat a known cause of cramps or spasms may help reduce their frequency or severity. Only take over-the-counter or prescription medicines as directed by your caregiver. SEEK MEDICAL CARE IF:  Your cramps or spasms get more severe, more frequent, or do not improve over time.  MAKE SURE YOU:   Understand these instructions.  Will watch your condition.  Will get help right away if you are not doing well or get worse. Document Released: 12/21/2001 Document Revised: 10/26/2012 Document Reviewed: 06/17/2012 [facility] Patient Information 2015 [place], [place]. This information is not intended to replace advice given to you by your health care provider. Make sure you discuss any questions you have with your health care  provider.  

## 2014-11-25 NOTE — Progress Notes (Signed)
Subjective:    Patient ID: Gabrielle Dominguez, female    DOB: 1976-06-10, 39 y.o.   MRN: [id number]  HPI  Gabrielle Dominguez is a 39 year old female who presents today with a chief complaint of mid left sided back spasm. Her pain has been present since yesterday morning upon waking. She presented to work and expereinced a moderate to severe spasm that was present to her left mid back and moved into her left leg. She had some difficulty driving home due to her pain. Once she arrived home she had to have assistance getting up her stairs into her house. Her pain has been constant since yesterday. She's taken ibuprofen and tylenol without any relief and has applied heat without help. Denies numbness/tingling, and reports that the pain to her left leg as resolved. Pain is worse upon movement. Denies trauma or injury.   Review of Systems  Constitutional: Negative for fever and chills.  Respiratory: Negative for shortness of breath.   Cardiovascular: Negative for chest pain.  Genitourinary: Negative for dysuria, urgency and frequency.  Musculoskeletal: Positive for myalgias and back pain.  Neurological: Negative for dizziness, numbness and headaches.       Past Medical History  Diagnosis Date  . History of endometriosis   . Abnormal Pap smear   . Worsening headaches   . Hypothyroidism   . History of chicken pox   . History of pneumonia 07/2010, 04/2011  . IBS (irritable bowel syndrome)   . Panic disorder   . Bipolar 2 disorder     History   Social History  . Marital Status: Married    Spouse Name: N/A  . Number of Children: 1  . Years of Education: N/A   Occupational History  . office manager    Social History Main Topics  . Smoking status: Former Smoker    Types: Cigarettes    Quit date: 07/15/2002  . Smokeless tobacco: Never Used  . Alcohol Use: 0.0 oz/week    0 Standard drinks or equivalent per week     Comment: rarely- social  . Drug Use: No  . Sexual Activity:   Partners: Male    Patent examiner Protection: None   Other Topics Concern  . Not on file   Social History Narrative   Caffeine: rarely   Lives with fiance, son (2000), mother, 1 dog   Occupation: unemployed   Edu: 12th grade   Activity: tries to walk   Diet: occasional fast food, lots of water, good fruits and vegetables, red meat 3x/wk    Past Surgical History  Procedure Laterality Date  . Laparoscopic endometriosis fulguration  2002 and 2009  . Cesarean section  2000  . Colonoscopy  10/2012    normal, mult random biopsies obtained, normal Fuller Plan)  . Bilateral salpingectomy N/A 05/10/2014    Procedure: BILATERAL SALPINGECTOMY;  Surgeon: Donnamae Jude, MD;  Location: [facility];  Service: Gynecology;  Laterality: N/A;  . Laparoscopic assisted vaginal hysterectomy N/A 05/10/2014    Procedure: LAPAROSCOPIC ASSISTED VAGINAL HYSTERECTOMY;  Surgeon: Donnamae Jude, MD;  Location: [facility];  Service: Gynecology;  Laterality: N/A;    Family History  Problem Relation Age of Onset  . Heart attack Paternal Grandfather   . Hypertension Paternal Grandfather   . Hypertension Maternal Grandmother   . Hypertension Father   . Melanoma Father   . Cancer Mother     vulvar  . Hypertension Mother   . Stroke Mother   . Colon cancer  Maternal Aunt 40  . Hypertension Brother   . Stroke Maternal Grandfather   . Melanoma Cousin   . Irritable bowel syndrome Mother   . Diabetes Neg Hx     Allergies  Allergen Reactions  . Penicillins Anaphylaxis and Hives    Current Outpatient Prescriptions on File Prior to Visit  Medication Sig Dispense Refill  . ALPRAZolam (XANAX) 0.5 MG tablet Take 1 tablet (0.5 mg total) by mouth as directed. 0.5-1 tablet twice daily as needed for anxiety. **Sedation precautions** 30 tablet 0  . fish oil-omega-3 fatty acids 1000 MG capsule Take 2 g by mouth daily.    Marland Kitchen levothyroxine (SYNTHROID, LEVOTHROID) 88 MCG tablet TAKE 1 TABLET BY MOUTH EVERY DAY. 90 tablet 3  . OVER THE  COUNTER MEDICATION thermoplus from [place]    . OVER THE COUNTER MEDICATION Catalyst by Elmon Else     No current facility-administered medications on file prior to visit.    BP 128/82 mmHg  Pulse 78  Temp(Src) 97.9 F (36.6 C) (Oral)  Ht 5\' 4"  (1.626 m)  Wt 210 lb 12.8 oz (95.618 kg)  BMI 36.17 kg/m2  SpO2 95%  LMP 09/12/2011    Objective:   Physical Exam  Constitutional: She is oriented to person, place, and time. She appears distressed.  Cardiovascular: Normal rate and regular rhythm.   Pulmonary/Chest: Effort normal and breath sounds normal.  Musculoskeletal:       Lumbar back: She exhibits decreased range of motion, tenderness, pain and spasm.  Most tender to muscle located to left mid part of back. Walking very slowly due to pain. Decreased ROM forward flexion, extension, lateral flexion and extension (esp to right lateral side)  Neurological: She is alert and oriented to person, place, and time. She has normal reflexes.  Skin: Skin is warm and dry.          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 Denies recent trauma/injury. No help with tylenol or ibuprofen. She does appear to be in moderate pain. RX for cyclobenzaprine tablets TID PRN. Warnings provided that this may make her drowsy. Apply heat, try massage, do not lay still for prolonged amounts of time. Follow up as needed.

## 2014-12-21 ENCOUNTER — Other Ambulatory Visit: Payer: Self-pay | Admitting: Family Medicine

## 2015-01-15 ENCOUNTER — Other Ambulatory Visit: Payer: Self-pay | Admitting: Family Medicine

## 2015-01-20 ENCOUNTER — Telehealth: Payer: Self-pay

## 2015-01-20 ENCOUNTER — Other Ambulatory Visit: Payer: Self-pay | Admitting: Family Medicine

## 2015-01-20 NOTE — Telephone Encounter (Signed)
Pt request refill [facility] to [facility]; pt cannot come in for appt next week due to work schedule and Dr Darnell Level out of offices the next 2 weeks; pt scheduled med refill appt on 02/13/15 to see Dr Darnell Level and spoke with Abigail Butts at [facility] and pt has available refills already in system; Abigail Butts will get refill ready for pick up. Pt advised to ck with pharmacy and to keep 02/13/15 appt; pt voiced understanding.

## 2015-02-13 ENCOUNTER — Encounter: Payer: Self-pay | Admitting: *Deleted

## 2015-02-13 ENCOUNTER — Encounter: Payer: Self-pay | Admitting: Family Medicine

## 2015-02-13 ENCOUNTER — Ambulatory Visit (INDEPENDENT_AMBULATORY_CARE_PROVIDER_SITE_OTHER): Payer: PRIVATE HEALTH INSURANCE | Admitting: Family Medicine

## 2015-02-13 VITALS — BP 128/82 | HR 76 | Temp 97.8°F | Wt 202.2 lb

## 2015-02-13 DIAGNOSIS — E669 Obesity, unspecified: Secondary | ICD-10-CM | POA: Diagnosis not present

## 2015-02-13 DIAGNOSIS — R109 Unspecified abdominal pain: Secondary | ICD-10-CM | POA: Diagnosis not present

## 2015-02-13 DIAGNOSIS — E039 Hypothyroidism, unspecified: Secondary | ICD-10-CM | POA: Diagnosis not present

## 2015-02-13 DIAGNOSIS — F3181 Bipolar II disorder: Secondary | ICD-10-CM

## 2015-02-13 DIAGNOSIS — R892 Abnormal level of other drugs, medicaments and biological substances in specimens from other organs, systems and tissues: Secondary | ICD-10-CM

## 2015-02-13 HISTORY — DX: Abnormal level of other drugs, medicaments and biological substances in specimens from other organs, systems and tissues: R89.2

## 2015-02-13 LAB — COMPREHENSIVE METABOLIC PANEL
ALT: 17 U/L (ref 0–35)
AST: 19 U/L (ref 0–37)
Albumin: 3.9 g/dL (ref 3.5–5.2)
Alkaline Phosphatase: 70 U/L (ref 39–117)
BUN: 7 mg/dL (ref 6–23)
CO2: 27 meq/L (ref 19–32)
CREATININE: 0.74 mg/dL (ref 0.40–1.20)
Calcium: 9 mg/dL (ref 8.4–10.5)
Chloride: 101 mEq/L (ref 96–112)
GFR: 92.44 mL/min (ref >60.00)
GLUCOSE: 87 mg/dL (ref 70–99)
POTASSIUM: 3.8 meq/L (ref 3.5–5.1)
SODIUM: 137 meq/L (ref 135–145)
Total Bilirubin: 0.5 mg/dL (ref 0.2–1.2)
Total Protein: 6.8 g/dL (ref 6.0–8.3)

## 2015-02-13 LAB — CBC WITH DIFFERENTIAL/PLATELET
Basophils Absolute: 0 10*3/uL (ref 0.0–0.1)
Basophils Relative: 0.4 % (ref 0.0–3.0)
Eosinophils Absolute: 0.1 10*3/uL (ref 0.0–0.7)
Eosinophils Relative: 1.4 % (ref 0.0–5.0)
HEMATOCRIT: 44.5 % (ref 36.0–46.0)
HEMOGLOBIN: 15.3 g/dL — AB (ref 12.0–15.0)
LYMPHS ABS: 2.3 10*3/uL (ref 0.7–4.0)
Lymphocytes Relative: 25.8 % (ref 12.0–46.0)
MCHC: 34.4 g/dL (ref 30.0–36.0)
MCV: 95.4 fl (ref 78.0–100.0)
Monocytes Absolute: 0.6 10*3/uL (ref 0.1–1.0)
Monocytes Relative: 6.6 % (ref 3.0–12.0)
Neutro Abs: 5.8 10*3/uL (ref 1.4–7.7)
Neutrophils Relative %: 65.8 % (ref 43.0–77.0)
PLATELETS: 316 10*3/uL (ref 150.0–400.0)
RBC: 4.66 Mil/uL (ref 3.87–5.11)
RDW: 12.2 % (ref 11.5–15.5)
WBC: 8.8 10*3/uL (ref 4.0–10.5)

## 2015-02-13 LAB — TSH: TSH: 1.77 u[IU]/mL (ref 0.35–4.50)

## 2015-02-13 LAB — LDL CHOLESTEROL, DIRECT: Direct LDL: 74 mg/dL

## 2015-02-13 MED ORDER — ALPRAZOLAM 0.5 MG PO TABS: 0.5000 mg | ORAL_TABLET | ORAL | Status: DC

## 2015-02-13 NOTE — Assessment & Plan Note (Signed)
?  IBS. Seems to be related to animal protein. rec increase plant based protein, trial probiotic like activia or philips colon health.

## 2015-02-13 NOTE — Assessment & Plan Note (Signed)
Did not tolerate bipolar medications. Now only on xanax rare use. Refilled today. UDS and controlled substance agreement filled out today. Discussed controlled substance use and risks including tolerance, dependence and addiction/abuse potential. Given rare use, I'm not concerned with above.

## 2015-02-13 NOTE — Progress Notes (Signed)
BP 128/82 mmHg  Pulse 76  Temp(Src) 97.8 F (36.6 C) (Oral)  Wt 202 lb 4 oz (91.74 kg)  LMP 09/12/2011   CC: med refill  Subjective:    Patient ID: Gabrielle Dominguez, female    DOB: 07/18/1975, 39 y.o.   MRN: [id number]  HPI: Gabrielle Dominguez is a 39 y.o. female presenting on 02/13/2015 for Medication Refill   S/p hysterectomy 04/2014. Feels great after this.  Hypothyroid - compliant with levothyroxine 77mcg daily. Due for labwork.  Lab Results  Component Value Date   TSH 1.78 11/22/2013     Bipolar - established with Dr Nicolasa Ducking. Then insurance would not cover visits. Continues seeing with her son (both in counseling). Did not tolerate bipolar medications. Now currently only takes alprazolam PRN, very rarely. Requests refill today. Needs controlled substance agreement filled out. Denies manic sxs, denies depressive sxs. Occasional irritability and trouble sleeping but learning to manage on her own.  Obesity - cutting back on portion sizes and has noted 21lb weight loss.  Body mass index is 34.7 kg/(m^2).   Over last 3-4 months noticing increased gi discomfort with meat. No trouble with plant based protein.   Relevant past medical, surgical, family and social history reviewed and updated as indicated. Interim medical history since our last visit reviewed. Allergies and medications reviewed and updated. Current Outpatient Prescriptions on File Prior to Visit  Medication Sig  . fish oil-omega-3 fatty acids 1000 MG capsule Take 2 g by mouth daily.  Marland Kitchen levothyroxine (SYNTHROID, LEVOTHROID) 88 MCG tablet TAKE 1 TABLET BY MOUTH EVERY DAY.  Marland Kitchen OVER THE COUNTER MEDICATION thermoplus from [place]  . OVER THE COUNTER MEDICATION Catalyst by Elmon Else   No current facility-administered medications on file prior to visit.    Review of Systems Per HPI unless specifically indicated above     Objective:    BP 128/82 mmHg  Pulse 76  Temp(Src) 97.8 F (36.6 C) (Oral)  Wt 202 lb 4 oz  (91.74 kg)  LMP 09/12/2011  Wt Readings from Last 3 Encounters:  02/13/15 202 lb 4 oz (91.74 kg)  11/25/14 210 lb 12.8 oz (95.618 kg)  09/20/14 218 lb (98.884 kg)    Physical Exam  Constitutional: She appears well-developed and well-nourished. No distress.  HENT:  Mouth/Throat: Oropharynx is clear and moist. No oropharyngeal exudate.  Neck: Normal range of motion. Neck supple. No thyromegaly present.  Cardiovascular: Normal rate, regular rhythm, normal heart sounds and intact distal pulses.   No murmur heard. Pulmonary/Chest: Effort normal and breath sounds normal. No respiratory distress. She has no wheezes. She has no rales.  Abdominal: Soft. Bowel sounds are normal. She exhibits no distension and no mass. There is no tenderness. There is no rebound and no guarding.  Musculoskeletal: She exhibits no edema.  Skin: Skin is warm and dry. No rash noted.  Psychiatric: She has a normal mood and affect.  Nursing note and vitals reviewed.     Assessment & Plan:   Problem List Items Addressed This Visit    Abdominal cramping    ?IBS. Seems to be related to animal protein. rec increase plant based protein, trial probiotic like activia or philips colon health.      Relevant Orders   Comprehensive metabolic panel   CBC with Differential/Platelet   Bipolar 2 disorder    Did not tolerate bipolar medications. Now only on xanax rare use. Refilled today. UDS and controlled substance agreement filled out today. Discussed controlled substance use and  risks including tolerance, dependence and addiction/abuse potential. Given rare use, I'm not concerned with above.      Hypothyroidism - Primary    Check labwork today.      Relevant Orders   TSH   Obesity    Discussed healthy diet changes to affect sustainable weight loss. Body mass index is 34.7 kg/(m^2).       Relevant Orders   LDL Cholesterol, Direct       Follow up plan: Return as needed.

## 2015-02-13 NOTE — Patient Instructions (Signed)
Try probiotic for gi upset. labwork today. Alprazolam refilled. Update controlled substance agreement today.

## 2015-02-13 NOTE — Assessment & Plan Note (Signed)
Discussed healthy diet changes to affect sustainable weight loss. Body mass index is 34.7 kg/(m^2).

## 2015-02-13 NOTE — Assessment & Plan Note (Signed)
Check lab work today

## 2015-02-13 NOTE — Progress Notes (Signed)
Pre visit review using our clinic review tool, if applicable. No additional management support is needed unless otherwise documented below in the visit note. 

## 2015-02-14 ENCOUNTER — Other Ambulatory Visit: Payer: Self-pay | Admitting: Family Medicine

## 2015-02-14 ENCOUNTER — Encounter: Payer: Self-pay | Admitting: *Deleted

## 2015-02-14 MED ORDER — LEVOTHYROXINE SODIUM 88 MCG PO TABS: ORAL_TABLET | ORAL | Status: DC

## 2015-03-05 ENCOUNTER — Encounter: Payer: Self-pay | Admitting: Family Medicine

## 2015-03-05 ENCOUNTER — Telehealth: Payer: Self-pay | Admitting: Family Medicine

## 2015-03-05 NOTE — Telephone Encounter (Signed)
Will discuss abnormal UDS with patient this week - appropriately neg xanax (rare use) but positive oxycodone. Will need more frequent screening (Q7mo). ?taking for abd pain/cramping but not prescribed by our office. Although had been prescribed by OBGYN last year.

## 2015-03-06 NOTE — Telephone Encounter (Signed)
Called, no answer. Did not leave message.

## 2015-03-14 ENCOUNTER — Encounter: Payer: Self-pay | Admitting: Family Medicine

## 2015-07-13 ENCOUNTER — Other Ambulatory Visit: Payer: Self-pay

## 2015-07-13 MED ORDER — ALPRAZOLAM 0.5 MG PO TABS: 0.5000 mg | ORAL_TABLET | ORAL | Status: DC

## 2015-07-13 NOTE — Telephone Encounter (Signed)
Since Christmas pt has had more anxiety and had a panic attack; pt is out of alprazolam and feeling anxious today and request refill to [facility]. Pt request cb when refilled. Pt last saw Dr Darnell Level on 02/13/15 and alprazolam last refilled # 30 on 02/13/15. Dr Darnell Level out of office.

## 2015-07-13 NOTE — Telephone Encounter (Signed)
Pt spoke with Estill Bamberg at front desk; did not want to leave a v/m on triage line. I returned pt's call and left v/m requesting cb.

## 2015-07-13 NOTE — Telephone Encounter (Addendum)
Rx called in as directed and patient notified.  

## 2015-11-03 ENCOUNTER — Ambulatory Visit (INDEPENDENT_AMBULATORY_CARE_PROVIDER_SITE_OTHER): Payer: PRIVATE HEALTH INSURANCE | Admitting: Obstetrics & Gynecology

## 2015-11-03 ENCOUNTER — Encounter: Payer: Self-pay | Admitting: Obstetrics & Gynecology

## 2015-11-03 VITALS — BP 126/84 | HR 83 | Resp 18 | Ht 64.0 in | Wt 192.0 lb

## 2015-11-03 DIAGNOSIS — R338 Other retention of urine: Secondary | ICD-10-CM | POA: Diagnosis not present

## 2015-11-03 DIAGNOSIS — Z0000 Encounter for general adult medical examination without abnormal findings: Secondary | ICD-10-CM

## 2015-11-03 DIAGNOSIS — Z113 Encounter for screening for infections with a predominantly sexual mode of transmission: Secondary | ICD-10-CM | POA: Diagnosis not present

## 2015-11-03 DIAGNOSIS — Z01419 Encounter for gynecological examination (general) (routine) without abnormal findings: Secondary | ICD-10-CM | POA: Diagnosis not present

## 2015-11-03 DIAGNOSIS — N760 Acute vaginitis: Secondary | ICD-10-CM | POA: Diagnosis not present

## 2015-11-03 LAB — POCT URINALYSIS DIPSTICK
Bilirubin, UA: NEGATIVE
Glucose, UA: NEGATIVE
Ketones, UA: NEGATIVE
NITRITE UA: NEGATIVE
PH UA: 6.5
Spec Grav, UA: 1.015
Urobilinogen, UA: 0.2

## 2015-11-03 MED ORDER — METRONIDAZOLE 500 MG PO TABS: 500.0000 mg | ORAL_TABLET | Freq: Two times a day (BID) | ORAL | Status: DC

## 2015-11-03 MED ORDER — FLUCONAZOLE 150 MG PO TABS: 150.0000 mg | ORAL_TABLET | Freq: Once | ORAL | Status: DC

## 2015-11-03 MED ORDER — SULFAMETHOXAZOLE-TRIMETHOPRIM 800-160 MG PO TABS: 1.0000 | ORAL_TABLET | Freq: Two times a day (BID) | ORAL | Status: DC

## 2015-11-03 NOTE — Progress Notes (Signed)
Pt recently going through a divorce, has a new sexual partner, requesting a STD screen.

## 2015-11-03 NOTE — Progress Notes (Signed)
Subjective:    Gabrielle Dominguez is a 40 y.o.  Separated W P1 (57 yo son) female who presents for an annual exam. She is having urinary urgency and wants STI testing, 2 new partners in the last year. The patient is sexually active. GYN screening history: last pap: was normal. The patient wears seatbelts: yes. The patient participates in regular exercise: yes. Has the patient ever been transfused or tattooed?: yes. The patient reports that there is not domestic violence in her life.   Menstrual History: OB History    Gravida Para Term Preterm AB TAB SAB Ectopic Multiple Living   2 1 1  1  1   1       Menarche age: 69  Patient's last menstrual period was 09/12/2011.    The following portions of the patient's history were reviewed and updated as appropriate: allergies, current medications, past family history, past medical history, past social history, past surgical history and problem list.  Review of Systems Pertinent items are noted in HPI.  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 She had her flu vaccine. She has no more pelvic pain.    Objective:    BP 126/84 mmHg  Pulse 83  Resp 18  Ht 5\' 4"  (1.626 m)  Wt 192 lb (87.091 kg)  BMI 32.94 kg/m2  LMP 09/12/2011  General Appearance:    Alert, cooperative, no distress, appears stated age  Head:    Normocephalic, without obvious abnormality, atraumatic  Eyes:    PERRL, conjunctiva/corneas clear, EOM's intact, fundi    benign, both eyes  Ears:    Normal TM's and external ear canals, both ears  Nose:   Nares normal, septum midline, mucosa normal, no drainage    or sinus tenderness  Throat:   Lips, mucosa, and tongue normal; teeth and gums normal  Neck:   Supple, symmetrical, trachea midline, no adenopathy;    thyroid:  no enlargement/tenderness/nodules; no carotid   bruit or JVD  Back:     Symmetric, no curvature, ROM normal, no CVA tenderness  Lungs:     Clear to auscultation bilaterally, respirations unlabored  Chest  Wall:    No tenderness or deformity   Heart:    Regular rate and rhythm, S1 and S2 normal, no murmur, rub   or gallop  Breast Exam:    No tenderness, masses, or nipple abnormality  Abdomen:     Soft, non-tender, bowel sounds active all four quadrants,    no masses, no organomegaly  Genitalia:    Normal female without lesion, discharge or tenderness, vaginal discharge c/w BV, normal bimanual exam     Extremities:   Extremities normal, atraumatic, no cyanosis or edema  Pulses:   2+ and symmetric all extremities  Skin:   Skin color, texture, turgor normal, no rashes or lesions  Lymph nodes:   Cervical, supraclavicular, and axillary nodes normal  Neurologic:   CNII-XII intact, normal strength, sensation and reflexes    throughout  .    Assessment:    Healthy female exam.   BV UTI   Plan:     STI testing per request   Mammogram at her convenience Bactrim Flagyl Diflucan prn

## 2015-11-04 LAB — HIV ANTIBODY (ROUTINE TESTING W REFLEX): HIV 1&2 Ab, 4th Generation: NONREACTIVE

## 2015-11-04 LAB — HEPATITIS B SURFACE ANTIGEN: HEP B S AG: NEGATIVE

## 2015-11-04 LAB — HEPATITIS C ANTIBODY: HCV Ab: NEGATIVE

## 2015-11-04 LAB — VITAMIN D 25 HYDROXY (VIT D DEFICIENCY, FRACTURES): Vit D, 25-Hydroxy: 34 ng/mL (ref 30–100)

## 2015-11-04 LAB — RPR

## 2015-11-05 LAB — URINE CULTURE
Colony Count: NO GROWTH
Organism ID, Bacteria: NO GROWTH

## 2015-11-06 LAB — GC/CHLAMYDIA PROBE AMP (~~LOC~~) NOT AT ARMC
Chlamydia: NEGATIVE
Neisseria Gonorrhea: NEGATIVE

## 2015-11-08 ENCOUNTER — Telehealth: Payer: Self-pay | Admitting: *Deleted

## 2015-11-08 NOTE — Telephone Encounter (Signed)
-----   Message from Francia Greaves sent at 11/07/2015  2:16 PM EDT ----- Regarding: Lab Results Wants lab results from 4/21 visit

## 2015-11-08 NOTE — Telephone Encounter (Signed)
Called pt to adv all labs are normal. Pt expressed understanding.

## 2016-04-10 ENCOUNTER — Other Ambulatory Visit: Payer: Self-pay | Admitting: Family Medicine

## 2016-04-15 ENCOUNTER — Other Ambulatory Visit: Payer: Self-pay | Admitting: Family Medicine

## 2016-05-01 ENCOUNTER — Encounter: Payer: Self-pay | Admitting: Family Medicine

## 2016-05-01 ENCOUNTER — Ambulatory Visit (INDEPENDENT_AMBULATORY_CARE_PROVIDER_SITE_OTHER): Payer: Self-pay | Admitting: Family Medicine

## 2016-05-01 VITALS — BP 110/80 | HR 73 | Temp 98.5°F | Wt 187.1 lb

## 2016-05-01 DIAGNOSIS — J029 Acute pharyngitis, unspecified: Secondary | ICD-10-CM

## 2016-05-01 DIAGNOSIS — E039 Hypothyroidism, unspecified: Secondary | ICD-10-CM

## 2016-05-01 LAB — TSH: TSH: 1.64 u[IU]/mL (ref 0.35–4.50)

## 2016-05-01 LAB — POCT RAPID STREP A (OFFICE): Rapid Strep A Screen: NEGATIVE

## 2016-05-01 NOTE — Patient Instructions (Addendum)
For throat pain- ibuprofen 2-3 tablets (400- 600 mg total) every 8 to 12 hours as needed Warm salt water gargles, OTC chloraseptic type spray, throat lozenges, lots of liquids  Pharyngitis Pharyngitis is redness, pain, and swelling (inflammation) of your pharynx.  CAUSES  Pharyngitis is usually caused by infection. Most of the time, these infections are from viruses (viral) and are part of a cold. However, sometimes pharyngitis is caused by bacteria (bacterial). Pharyngitis can also be caused by allergies. Viral pharyngitis may be spread from person to person by coughing, sneezing, and personal items or utensils (cups, forks, spoons, toothbrushes). Bacterial pharyngitis may be spread from person to person by more intimate contact, such as kissing.  SIGNS AND SYMPTOMS  Symptoms of pharyngitis include:   Sore throat.   Tiredness (fatigue).   Low-grade fever.   Headache.  Joint pain and muscle aches.  Skin rashes.  Swollen lymph nodes.  Plaque-like film on throat or tonsils (often seen with bacterial pharyngitis). DIAGNOSIS  Your health care provider will ask you questions about your illness and your symptoms. Your medical history, along with a physical exam, is often all that is needed to diagnose pharyngitis. Sometimes, a rapid strep test is done. Other lab tests may also be done, depending on the suspected cause.  TREATMENT  Viral pharyngitis will usually get better in 3-4 days without the use of medicine. Bacterial pharyngitis is treated with medicines that kill germs (antibiotics).  HOME CARE INSTRUCTIONS   Drink enough water and fluids to keep your urine clear or pale yellow.   Only take over-the-counter or prescription medicines as directed by your health care provider:   If you are prescribed antibiotics, make sure you finish them even if you start to feel better.   Do not take aspirin.   Get lots of rest.   Gargle with 8 oz of salt water ( tsp of salt per 1 qt  of water) as often as every 1-2 hours to soothe your throat.   Throat lozenges (if you are not at risk for choking) or sprays may be used to soothe your throat. SEEK MEDICAL CARE IF:   You have large, tender lumps in your neck.  You have a rash.  You cough up green, yellow-brown, or bloody spit. SEEK IMMEDIATE MEDICAL CARE IF:   Your neck becomes stiff.  You drool or are unable to swallow liquids.  You vomit or are unable to keep medicines or liquids down.  You have severe pain that does not go away with the use of recommended medicines.  You have trouble breathing (not caused by a stuffy nose). MAKE SURE YOU:   Understand these instructions.  Will watch your condition.  Will get help right away if you are not doing well or get worse.   This information is not intended to replace advice given to you by your health care provider. Make sure you discuss any questions you have with your health care provider.   Document Released: 07/01/2005 Document Revised: 04/21/2013 Document Reviewed: 03/08/2013 Elsevier Interactive Patient Education [employer].

## 2016-05-01 NOTE — Progress Notes (Signed)
Subjective:    Patient ID: Gabrielle Dominguez, female    DOB: Dec 06, 1975, 40 y.o.   MRN: [id number]  HPI This is a 40 yo female who presents today with 3 days of left sided sore throat, 2 days of muscle aches/chills. Took tylenol and nyquil with little relief. No near pain, slight headache, no cough, no shortness of breath. No nausea or vomiting. No known sick contacts.  She takes synthroid and has not had TSH checked in over a year. Has not been able to schedule visit due to her schedule. She works a full time job Musician. She would like blood work checked today. She had other labs checked by gyn earlier this year.   Past Medical History:  Diagnosis Date  . Abnormal drug screen 02/2015   inapprop pos oxycodone (02/2015)  . Abnormal Pap smear   . Bipolar 2 disorder ([place])   . History of chicken pox   . History of endometriosis   . History of pneumonia 07/2010, 04/2011  . Hypothyroidism   . IBS (irritable bowel syndrome)   . Panic disorder   . Worsening headaches    Past Surgical History:  Procedure Laterality Date  . BILATERAL SALPINGECTOMY N/A 05/10/2014   Procedure: BILATERAL SALPINGECTOMY;  Surgeon: Donnamae Jude, MD;  Location: [facility];  Service: Gynecology;  Laterality: N/A;  . CESAREAN SECTION  2000  . COLONOSCOPY  10/2012   normal, mult random biopsies obtained, normal Fuller Plan)  . LAPAROSCOPIC ASSISTED VAGINAL HYSTERECTOMY N/A 05/10/2014   Procedure: LAPAROSCOPIC ASSISTED VAGINAL HYSTERECTOMY;  Surgeon: Donnamae Jude, MD;  Location: [facility];  Service: Gynecology;  Laterality: N/A;  . LAPAROSCOPIC ENDOMETRIOSIS FULGURATION  2002 and 2009   Family History  Problem Relation Age of Onset  . Heart attack Paternal Grandfather   . Hypertension Paternal Grandfather   . Hypertension Maternal Grandmother   . Hypertension Father   . Melanoma Father   . Cancer Mother     vulvar  . Hypertension Mother   . Stroke Mother   . Colon cancer Maternal Aunt 57  . Hypertension Brother    . Stroke Maternal Grandfather   . Melanoma Cousin   . Irritable bowel syndrome Mother   . Diabetes Neg Hx    Social History  Substance Use Topics  . Smoking status: Former Smoker    Types: Cigarettes    Quit date: 07/15/2002  . Smokeless tobacco: Never Used  . Alcohol use 0.0 oz/week     Comment: rarely- social      Review of Systems Per HPI    Objective:   Physical Exam  Constitutional: She is oriented to person, place, and time. She appears well-developed and well-nourished. No distress.  HENT:  Head: Normocephalic and atraumatic.  Right Ear: Tympanic membrane, external ear and ear canal normal.  Left Ear: Tympanic membrane, external ear and ear canal normal.  Nose: Nose normal.  Mouth/Throat: Uvula is midline and mucous membranes are normal. No oropharyngeal exudate, posterior oropharyngeal erythema or tonsillar abscesses.  +2 tonsils  Eyes: Conjunctivae are normal.  Neck: Normal range of motion. Neck supple.  Cardiovascular: Normal rate.   Pulmonary/Chest: Effort normal.  Lymphadenopathy:    She has cervical adenopathy (left).  Neurological: She is alert and oriented to person, place, and time.  Skin: Skin is warm and dry. She is not diaphoretic.  Psychiatric: She has a normal mood and affect. Her behavior is normal. Judgment and thought content normal.  Vitals reviewed.  BP 110/80   Pulse 73   Temp 98.5 F (36.9 C)   Wt 187 lb 1.9 oz (84.9 kg)   LMP 09/12/2011   SpO2 97%   BMI 32.12 kg/m  Wt Readings from Last 3 Encounters:  05/01/16 187 lb 1.9 oz (84.9 kg)  11/03/15 192 lb (87.1 kg)  02/13/15 202 lb 4 oz (91.7 kg)       Assessment & Plan:  1. Sore throat - likely viral, but will confirm negative rapid strep with culture. Provided information regarding RTC precautions and symptomatic treatment - POCT rapid strep A - Culture, Group A Strep  2. Hypothyroidism, unspecified type - TSH   Clarene Reamer, FNP-BC  [facility]  05/01/2016 10:37 AM

## 2016-05-02 ENCOUNTER — Other Ambulatory Visit: Payer: Self-pay

## 2016-05-02 MED ORDER — LEVOTHYROXINE SODIUM 88 MCG PO TABS: 88.0000 ug | ORAL_TABLET | Freq: Every day | ORAL | 3 refills | Status: DC

## 2016-05-02 NOTE — Addendum Note (Signed)
Addended by: Clarene Reamer B on: 05/02/2016 08:14 AM   Modules accepted: Orders

## 2016-05-03 LAB — CULTURE, GROUP A STREP: Organism ID, Bacteria: NORMAL

## 2016-06-13 ENCOUNTER — Ambulatory Visit (INDEPENDENT_AMBULATORY_CARE_PROVIDER_SITE_OTHER): Payer: Self-pay | Admitting: Family Medicine

## 2016-06-13 ENCOUNTER — Encounter: Payer: Self-pay | Admitting: Family Medicine

## 2016-06-13 VITALS — BP 124/82 | HR 83 | Temp 98.3°F | Wt 188.5 lb

## 2016-06-13 DIAGNOSIS — B009 Herpesviral infection, unspecified: Secondary | ICD-10-CM

## 2016-06-13 DIAGNOSIS — F418 Other specified anxiety disorders: Secondary | ICD-10-CM

## 2016-06-13 MED ORDER — VALACYCLOVIR HCL 500 MG PO TABS: 500.0000 mg | ORAL_TABLET | Freq: Two times a day (BID) | ORAL | 3 refills | Status: DC

## 2016-06-13 NOTE — Progress Notes (Signed)
Pre visit review using our clinic review tool, if applicable. No additional management support is needed unless otherwise documented below in the visit note. 

## 2016-06-13 NOTE — Patient Instructions (Signed)
Please let me know if you are not better in a couple of days Drink enough water, rest (take Xanax at bedtime as needed) Try to get outside every day- take a short walk Take some time for yourself with stretching or meditation

## 2016-06-13 NOTE — Progress Notes (Signed)
Subjective:    Patient ID: Gabrielle Dominguez, female    DOB: 06-Jun-1976, 40 y.o.   MRN: [id number]  HPI This is a 40 yo female, accompanied by her mother, who presents today with several concerns- Has a rash on herleft  buttock, was diagnosed with herpes previously and has used Valtrex in the past. Painful, appeared yesterday (predromal pain).  Her 28 yo son is going to be moving in with his father to help him stay out of trouble and she is very upset that he will moving out. She feels like it is in her son's best interest and feels that his father will be able to provide a more stable environment (the patient works two jobs) with more regular structure. Woke up this morning and had swelling of her face and hands and feels like she has a lump in her throat. Symptoms mostly resolved and she is not sure if related to excessive crying.  Has some xanax prescribed by Dr. Danise Mina but uses rarely. Not sleeping well due to stress.    Past Medical History:  Diagnosis Date  . Abnormal drug screen 02/2015   inapprop pos oxycodone (02/2015)  . Abnormal Pap smear   . Bipolar 2 disorder ([place])   . History of chicken pox   . History of endometriosis   . History of pneumonia 07/2010, 04/2011  . Hypothyroidism   . IBS (irritable bowel syndrome)   . Panic disorder   . Worsening headaches    Past Surgical History:  Procedure Laterality Date  . BILATERAL SALPINGECTOMY N/A 05/10/2014   Procedure: BILATERAL SALPINGECTOMY;  Surgeon: Donnamae Jude, MD;  Location: [facility];  Service: Gynecology;  Laterality: N/A;  . CESAREAN SECTION  2000  . COLONOSCOPY  10/2012   normal, mult random biopsies obtained, normal Fuller Plan)  . LAPAROSCOPIC ASSISTED VAGINAL HYSTERECTOMY N/A 05/10/2014   Procedure: LAPAROSCOPIC ASSISTED VAGINAL HYSTERECTOMY;  Surgeon: Donnamae Jude, MD;  Location: [facility];  Service: Gynecology;  Laterality: N/A;  . LAPAROSCOPIC ENDOMETRIOSIS FULGURATION  2002 and 2009   Family History  Problem  Relation Age of Onset  . Heart attack Paternal Grandfather   . Hypertension Paternal Grandfather   . Hypertension Maternal Grandmother   . Hypertension Father   . Melanoma Father   . Cancer Mother     vulvar  . Hypertension Mother   . Stroke Mother   . Irritable bowel syndrome Mother   . Colon cancer Maternal Aunt 36  . Hypertension Brother   . Stroke Maternal Grandfather   . Melanoma Cousin   . Diabetes Neg Hx    Social History  Substance Use Topics  . Smoking status: Former Smoker    Types: Cigarettes    Quit date: 07/15/2002  . Smokeless tobacco: Never Used  . Alcohol use 0.0 oz/week     Comment: rarely- social     Review of Systems Per HPI    Objective:   Physical Exam  Constitutional: She is oriented to person, place, and time. She appears well-developed and well-nourished. No distress.  HENT:  Head: Normocephalic and atraumatic.  Mouth/Throat: Oropharynx is clear and moist.  Eyes: Conjunctivae are normal. Pupils are equal, round, and reactive to light.  Mildly puffy eyes.    Cardiovascular: Normal rate.   Pulmonary/Chest: Effort normal.  Neurological: She is alert and oriented to person, place, and time.  Skin: Skin is warm and dry. Rash (Right buttock iwth 3-4 cm area small vessicles.) noted. She is not diaphoretic.  Psychiatric:  Intermittently crying during appointment.   Vitals reviewed.     BP 124/82 (BP Location: Left Arm, Patient Position: Sitting, Cuff Size: Normal)   Pulse 83   Temp 98.3 F (36.8 C) (Oral)   Wt 188 lb 8 oz (85.5 kg)   LMP 09/12/2011   SpO2 98%   BMI 32.36 kg/m  Wt Readings from Last 3 Encounters:  06/13/16 188 lb 8 oz (85.5 kg)  05/01/16 187 lb 1.9 oz (84.9 kg)  11/03/15 192 lb (87.1 kg)       Assessment & Plan:  1. HSV infection - valACYclovir (VALTREX) 500 MG tablet; Take 1 tablet (500 mg total) by mouth 2 (two) times daily.  Dispense: 6 tablet; Refill: 3  2. Situational anxiety - encouraged her to use Xanax  sparingly, occasionally for sleep is ok - encouraged self care- adequate exercise, sleep, consider meditation/relaxation  - follow up in a couple of days if not improved  Clarene Reamer, FNP-BC  [facility], [place]  06/14/2016 9:04 PM

## 2017-03-24 ENCOUNTER — Encounter: Payer: Self-pay | Admitting: Family Medicine

## 2017-03-24 ENCOUNTER — Ambulatory Visit (INDEPENDENT_AMBULATORY_CARE_PROVIDER_SITE_OTHER): Payer: No Typology Code available for payment source | Admitting: Family Medicine

## 2017-03-24 VITALS — BP 120/80 | HR 76 | Temp 98.1°F | Wt 191.0 lb

## 2017-03-24 DIAGNOSIS — F3181 Bipolar II disorder: Secondary | ICD-10-CM

## 2017-03-24 DIAGNOSIS — F4321 Adjustment disorder with depressed mood: Secondary | ICD-10-CM

## 2017-03-24 DIAGNOSIS — E039 Hypothyroidism, unspecified: Secondary | ICD-10-CM | POA: Diagnosis not present

## 2017-03-24 DIAGNOSIS — E669 Obesity, unspecified: Secondary | ICD-10-CM

## 2017-03-24 LAB — LIPID PANEL
CHOL/HDL RATIO: 3
CHOLESTEROL: 149 mg/dL (ref 0–200)
HDL: 52.9 mg/dL (ref >39.00)
LDL Cholesterol: 79 mg/dL (ref 0–99)
NonHDL: 96.49
TRIGLYCERIDES: 85 mg/dL (ref 0.0–149.0)
VLDL: 17 mg/dL (ref 0.0–40.0)

## 2017-03-24 LAB — TSH: TSH: 1.61 u[IU]/mL (ref 0.35–4.50)

## 2017-03-24 LAB — BASIC METABOLIC PANEL
BUN: 10 mg/dL (ref 6–23)
CHLORIDE: 104 meq/L (ref 96–112)
CO2: 26 meq/L (ref 19–32)
Calcium: 8.9 mg/dL (ref 8.4–10.5)
Creatinine, Ser: 0.88 mg/dL (ref 0.40–1.20)
GFR: 74.9 mL/min (ref >60.00)
GLUCOSE: 98 mg/dL (ref 70–99)
POTASSIUM: 4.1 meq/L (ref 3.5–5.1)
Sodium: 138 mEq/L (ref 135–145)

## 2017-03-24 MED ORDER — ARIPIPRAZOLE 5 MG PO TABS: 5.0000 mg | ORAL_TABLET | Freq: Every day | ORAL | 1 refills | Status: DC

## 2017-03-24 MED ORDER — LEVOTHYROXINE SODIUM 88 MCG PO TABS: 88.0000 ug | ORAL_TABLET | Freq: Every day | ORAL | 3 refills | Status: DC

## 2017-03-24 NOTE — Assessment & Plan Note (Signed)
H/o this per psych. Discussed concerns with sole antidepressant treatment. Pt agrees to try abilify. Update with effect in 2wks, RTC 4-6 wks f/u visit.

## 2017-03-24 NOTE — Patient Instructions (Addendum)
Labs today.  Start abilify - sent to pharmacy. Update me with effect in 2 weeks.  Return in 4-6 weeks for follow up visit.

## 2017-03-24 NOTE — Assessment & Plan Note (Signed)
Situational depression from difficult work stress. Support provided. Worsening irritability and insomnia due to this. She was interested in wellbutrin. Discussed concerns with sole antidepressant treatment. She agrees to try abilify - reviewed possible side effects of tremor, weight gain (mild) and involuntary movements. rec update with effect in 2wks, RTC 4-6 wks f/u visit. Pt agrees with plan. Will hold off on benzo at this time (xanax and valium previously overly sedating).  She is in process of finding new job.  PHQ9 = 23/27 GAD7 = 20/21
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
## 2017-03-24 NOTE — Assessment & Plan Note (Addendum)
Pt hesitant for any med with possible weight gain side effect.

## 2017-03-24 NOTE — Assessment & Plan Note (Signed)
Tolerating current dose well. Update labs today and refilled levothyroxine.

## 2017-03-25 ENCOUNTER — Telehealth: Payer: Self-pay

## 2017-03-25 MED ORDER — BUPROPION HCL ER (SR) 100 MG PO TB12
100.0000 mg | ORAL_TABLET | Freq: Two times a day (BID) | ORAL | 1 refills | Status: DC
Start: 2017-03-25 — End: 2017-05-05

## 2017-03-25 NOTE — Telephone Encounter (Signed)
Ok let's try wellbutrin - sent to pharmacy. Keep f/u appt.  First week take one tablet daily, then increase to bid.

## 2017-03-25 NOTE — Telephone Encounter (Signed)
Pt called and abilify cost to pt was >$600.00 which was too expensive for pt and pt said Dr Darnell Level had mentioned possibly using wellbutrin. Pt wanted to see if Dr Darnell Level wanted to send in wellbutrin or what he wanted to do. If necessary pt will call ins co but did not think Wellbutrin would be too expensive. [place] Pt request cb.

## 2017-05-05 ENCOUNTER — Ambulatory Visit (INDEPENDENT_AMBULATORY_CARE_PROVIDER_SITE_OTHER): Payer: No Typology Code available for payment source | Admitting: Family Medicine

## 2017-05-05 ENCOUNTER — Encounter: Payer: Self-pay | Admitting: Family Medicine

## 2017-05-05 VITALS — BP 120/78 | HR 74 | Temp 98.4°F | Wt 195.0 lb

## 2017-05-05 DIAGNOSIS — F4321 Adjustment disorder with depressed mood: Secondary | ICD-10-CM | POA: Diagnosis not present

## 2017-05-05 MED ORDER — VALACYCLOVIR HCL 500 MG PO TABS: 500.0000 mg | ORAL_TABLET | Freq: Two times a day (BID) | ORAL | 3 refills | Status: DC

## 2017-05-05 MED ORDER — BUPROPION HCL ER (SR) 100 MG PO TB12: 100.0000 mg | ORAL_TABLET | Freq: Two times a day (BID) | ORAL | 3 refills | Status: DC

## 2017-05-05 NOTE — Progress Notes (Signed)
BP 120/78 (BP Location: Left Arm, Patient Position: Sitting, Cuff Size: Normal)   Pulse 74   Temp 98.4 F (36.9 C) (Oral)   Wt 195 lb (88.5 kg)   LMP 09/12/2011   SpO2 96%   BMI 33.47 kg/m    CC: f/u visit Subjective:    Patient ID: Gabrielle Dominguez, female    DOB: [birth year], 41 y.o.   MRN: [id number]  HPI: Gabrielle Dominguez is a 41 y.o. female presenting on 05/05/2017 for Medication follow-up (for wellbutrin)   See prior note for details.  Last visit we wanted to start abilify however this was too expensive (in ?hx bipolar dx by Dr Nicolasa Ducking). So we started wellbutrin and tapered up to 100mg  SR BID. Energy levels improved, depression has significantly improved. Improved focus, motivation, drive. No manic symptoms.   Ongoing stressful job environment, but handling better. Continues looking for new job. She works 2 jobs.   Relevant past medical, surgical, family and social history reviewed and updated as indicated. Interim medical history since our last visit reviewed. Allergies and medications reviewed and updated. Outpatient Medications Prior to Visit  Medication Sig Dispense Refill  . levothyroxine (SYNTHROID, LEVOTHROID) 88 MCG tablet Take 1 tablet (88 mcg total) by mouth daily before breakfast. 90 tablet 3  . buPROPion (WELLBUTRIN SR) 100 MG 12 hr tablet Take 1 tablet (100 mg total) by mouth 2 (two) times daily. First week take 1 tablet daily 60 tablet 1   No facility-administered medications prior to visit.      Per HPI unless specifically indicated in ROS section below Review of Systems     Objective:    BP 120/78 (BP Location: Left Arm, Patient Position: Sitting, Cuff Size: Normal)   Pulse 74   Temp 98.4 F (36.9 C) (Oral)   Wt 195 lb (88.5 kg)   LMP 09/12/2011   SpO2 96%   BMI 33.47 kg/m   Wt Readings from Last 3 Encounters:  05/05/17 195 lb (88.5 kg)  03/24/17 191 lb (86.6 kg)  06/13/16 188 lb 8 oz (85.5 kg)    Physical Exam  Constitutional: She  appears well-developed and well-nourished. No distress.  Psychiatric: She has a normal mood and affect. Her behavior is normal.  Nursing note and vitals reviewed.  Results for orders placed or performed in visit on 03/24/17  TSH  Result Value Ref Range   TSH 1.61 0.35 - 4.50 uIU/mL  Lipid panel  Result Value Ref Range   Cholesterol 149 0 - 200 mg/dL   Triglycerides 85.0 0.0 - 149.0 mg/dL   HDL 52.90 >39.00 mg/dL   VLDL 17.0 0.0 - 40.0 mg/dL   LDL Cholesterol 79 0 - 99 mg/dL   Total CHOL/HDL Ratio 3    NonHDL 29.47   Basic metabolic panel  Result Value Ref Range   Sodium 138 135 - 145 mEq/L   Potassium 4.1 3.5 - 5.1 mEq/L   Chloride 104 96 - 112 mEq/L   CO2 26 19 - 32 mEq/L   Glucose, Bld 98 70 - 99 mg/dL   BUN 10 6 - 23 mg/dL   Creatinine, Ser 0.88 0.40 - 1.20 mg/dL   Calcium 8.9 8.4 - 10.5 mg/dL   GFR 74.90 >60.00 mL/min      Assessment & Plan:   Problem List Items Addressed This Visit    Adjustment disorder with depressed mood - Primary    Marked improvement since starting wellbutrin SR 100mg  bid - will continue this. RTC 6-8  mo CPE.  She continues looking for new job.  PHQ9 = 23 --> 5 GAD7 = 20 --> 7          Follow up plan: Return in about 8 months (around 01/03/2018) for annual exam, prior fasting for blood work.  Ria Bush, MD

## 2017-05-05 NOTE — Assessment & Plan Note (Addendum)
Marked improvement since starting wellbutrin SR 100mg  bid - will continue this. RTC 6-8 mo CPE.  She continues looking for new job.  PHQ9 = 23 --> 5 GAD7 = 20 --> 7

## 2017-05-05 NOTE — Patient Instructions (Signed)
I'm glad you're doing so well! Continue wellbutrin SR 100mg  twice daily. Return as needed or in 6-8 months for physical.

## 2017-11-28 ENCOUNTER — Other Ambulatory Visit: Payer: Self-pay | Admitting: Obstetrics & Gynecology

## 2017-11-28 ENCOUNTER — Ambulatory Visit (INDEPENDENT_AMBULATORY_CARE_PROVIDER_SITE_OTHER): Payer: Self-pay | Admitting: Obstetrics & Gynecology

## 2017-11-28 ENCOUNTER — Ambulatory Visit
Admission: RE | Admit: 2017-11-28 | Discharge: 2017-11-28 | Disposition: A | Discharge status: Home / Self Care | Payer: Self-pay | Source: Ambulatory Visit | Attending: Obstetrics & Gynecology | Admitting: Obstetrics & Gynecology

## 2017-11-28 ENCOUNTER — Encounter: Payer: Self-pay | Admitting: Obstetrics & Gynecology

## 2017-11-28 VITALS — BP 130/88 | HR 86 | Ht 64.0 in | Wt 199.0 lb

## 2017-11-28 DIAGNOSIS — Z1231 Encounter for screening mammogram for malignant neoplasm of breast: Secondary | ICD-10-CM

## 2017-11-28 DIAGNOSIS — Z01419 Encounter for gynecological examination (general) (routine) without abnormal findings: Secondary | ICD-10-CM

## 2017-11-28 NOTE — Progress Notes (Signed)
Subjective:    Gabrielle Dominguez is a 42 y.o. divorced P40 (73 yo son) female who presents for an annual exam. The patient has no complaints today. The patient is sexually active. GYN screening history: last pap: was normal. The patient wears seatbelts: yes. The patient participates in regular exercise: yes. Has the patient ever been transfused or tattooed?: yes. The patient reports that there is not domestic violence in her life.   Menstrual History: OB History    Gravida  2   Para  1   Term  1   Preterm      AB  1   Living  1     SAB  1   TAB      Ectopic      Multiple      Live Births  1           Menarche age: 42 Patient's last menstrual period was 09/12/2011.    The following portions of the patient's history were reviewed and updated as appropriate: allergies, current medications, past family history, past medical history, past social history, past surgical history and problem list.  Review of Systems Pertinent items are noted in HPI.    Objective:    BP 130/88   Pulse 86   Ht 5\' 4"  (1.626 m)   Wt 199 lb (90.3 kg)   LMP 09/12/2011   BMI 34.16 kg/m   General Appearance:    Alert, cooperative, no distress, appears stated age  Head:    Normocephalic, without obvious abnormality, atraumatic  Eyes:    PERRL, conjunctiva/corneas clear, EOM's intact, fundi    benign, both eyes  Ears:    Normal TM's and external ear canals, both ears  Nose:   Nares normal, septum midline, mucosa normal, no drainage    or sinus tenderness  Throat:   Lips, mucosa, and tongue normal; teeth and gums normal  Neck:   Supple, symmetrical, trachea midline, no adenopathy;    thyroid:  no enlargement/tenderness/nodules; no carotid   bruit or JVD  Back:     Symmetric, no curvature, ROM normal, no CVA tenderness  Lungs:     Clear to auscultation bilaterally, respirations unlabored  Chest Wall:    No tenderness or deformity   Heart:    Regular rate and rhythm, S1 and S2 normal, no  murmur, rub   or gallop  Breast Exam:    No tenderness, masses, or nipple abnormality  Abdomen:     Soft, non-tender, bowel sounds active all four quadrants,    no masses, no organomegaly  Genitalia:    Normal female without lesion, discharge or tenderness, bimanual exam reveals no masses or tenderness     Extremities:   Extremities normal, atraumatic, no cyanosis or edema  Pulses:   2+ and symmetric all extremities  Skin:   Skin color, texture, turgor normal, no rashes or lesions  Lymph nodes:   Cervical, supraclavicular, and axillary nodes normal  Neurologic:   CNII-XII intact, normal strength, sensation and reflexes    throughout  .    Assessment:    Healthy female exam.    Plan:     Mammogram.

## 2017-12-01 ENCOUNTER — Other Ambulatory Visit (HOSPITAL_COMMUNITY): Payer: Self-pay | Admitting: Obstetrics & Gynecology

## 2017-12-01 DIAGNOSIS — N6489 Other specified disorders of breast: Secondary | ICD-10-CM

## 2017-12-01 DIAGNOSIS — R928 Other abnormal and inconclusive findings on diagnostic imaging of breast: Secondary | ICD-10-CM

## 2017-12-10 ENCOUNTER — Ambulatory Visit
Admission: RE | Admit: 2017-12-10 | Discharge: 2017-12-10 | Disposition: A | Discharge status: Home / Self Care | Payer: Self-pay | Source: Ambulatory Visit | Attending: Obstetrics & Gynecology | Admitting: Obstetrics & Gynecology

## 2017-12-10 DIAGNOSIS — R928 Other abnormal and inconclusive findings on diagnostic imaging of breast: Secondary | ICD-10-CM | POA: Insufficient documentation

## 2017-12-10 DIAGNOSIS — N6489 Other specified disorders of breast: Secondary | ICD-10-CM | POA: Insufficient documentation

## 2018-01-05 ENCOUNTER — Encounter: Payer: No Typology Code available for payment source | Admitting: Family Medicine

## 2018-01-05 DIAGNOSIS — Z0289 Encounter for other administrative examinations: Secondary | ICD-10-CM

## 2018-04-06 ENCOUNTER — Other Ambulatory Visit: Payer: Self-pay | Admitting: Family Medicine

## 2018-07-08 NOTE — Progress Notes (Signed)
BP 120/82 (BP Location: Left Arm, Patient Position: Sitting, Cuff Size: Normal)   Pulse 68   Temp 98.2 F (36.8 C) (Oral)   Ht 5\' 4"  (1.626 m)   Wt 192 lb 8 oz (87.3 kg)   LMP 09/12/2011   SpO2 95%   BMI 33.04 kg/m    CC: hypothyroid f/u visit Subjective:    Patient ID: Gabrielle Dominguez, female    DOB: August 06, 1975, 42 y.o.   MRN: [id number]  HPI: Gabrielle Dominguez is a 42 y.o. female presenting on 07/09/2018 for Hypothyroidism (Here for f/u. )    Last seen 04/2017.  Hypothyroidism - compliant with levothyroxine 68mcg daily. Overdue for labs. Takes this medicine in the morning on an empty stomach 1 hr before breakfast. Doesn't take supplements or vitamins. Denies heat or cold intolerance, skin changes, fatigue, diarrhea/constipation, palpitations.   Weight - has cut out sodas, drinking mainly water over the last 4-5 months. Limits carbs.   Previously tried phentermine with good effect.   S/p hysterectomy 2015, ovaries remain.   Requests valtrex refilled - for herpes outbreak L buttock that happens about 3x/year     Relevant past medical, surgical, family and social history reviewed and updated as indicated. Interim medical history since our last visit reviewed. Allergies and medications reviewed and updated. Outpatient Medications Prior to Visit  Medication Sig Dispense Refill  . levothyroxine (SYNTHROID, LEVOTHROID) 88 MCG tablet TAKE 1 TABLET BY MOUTH ONCE DAILY BEFORE BREAKFAST 90 tablet 0  . valACYclovir (VALTREX) 500 MG tablet Take 1 tablet (500 mg total) by mouth 2 (two) times daily. (Patient taking differently: Take 500 mg by mouth 2 (two) times daily. As needed) 30 tablet 3   No facility-administered medications prior to visit.      Per HPI unless specifically indicated in ROS section below Review of Systems Objective:    BP 120/82 (BP Location: Left Arm, Patient Position: Sitting, Cuff Size: Normal)   Pulse 68   Temp 98.2 F (36.8 C) (Oral)   Ht 5\' 4"  (1.626 m)    Wt 192 lb 8 oz (87.3 kg)   LMP 09/12/2011   SpO2 95%   BMI 33.04 kg/m   Wt Readings from Last 3 Encounters:  07/09/18 192 lb 8 oz (87.3 kg)  11/28/17 199 lb (90.3 kg)  05/05/17 195 lb (88.5 kg)    Physical Exam Vitals signs and nursing note reviewed.  Constitutional:      General: She is not in acute distress.    Appearance: Normal appearance.  HENT:     Head: Normocephalic and atraumatic.     Nose: Nose normal.     Mouth/Throat:     Mouth: Mucous membranes are moist.     Pharynx: Oropharynx is clear.  Eyes:     Extraocular Movements: Extraocular movements intact.     Pupils: Pupils are equal, round, and reactive to light.  Neck:     Musculoskeletal: Normal range of motion.     Thyroid: No thyroid mass, thyromegaly or thyroid tenderness.     Vascular: No carotid bruit.  Cardiovascular:     Rate and Rhythm: Normal rate and regular rhythm.     Pulses: Normal pulses.     Heart sounds: No murmur.  Pulmonary:     Effort: Pulmonary effort is normal. No respiratory distress.     Breath sounds: Normal breath sounds. No wheezing or rhonchi.  Musculoskeletal:     Right lower leg: No edema.     Left  lower leg: No edema.  Skin:    Findings: No rash.  Neurological:     Mental Status: She is alert.  Psychiatric:        Mood and Affect: Mood normal.       Results for orders placed or performed in visit on 03/24/17  TSH  Result Value Ref Range   TSH 1.61 0.35 - 4.50 uIU/mL  Lipid panel  Result Value Ref Range   Cholesterol 149 0 - 200 mg/dL   Triglycerides 85.0 0.0 - 149.0 mg/dL   HDL 52.90 >39.00 mg/dL   VLDL 17.0 0.0 - 40.0 mg/dL   LDL Cholesterol 79 0 - 99 mg/dL   Total CHOL/HDL Ratio 3    NonHDL 51.02   Basic metabolic panel  Result Value Ref Range   Sodium 138 135 - 145 mEq/L   Potassium 4.1 3.5 - 5.1 mEq/L   Chloride 104 96 - 112 mEq/L   CO2 26 19 - 32 mEq/L   Glucose, Bld 98 70 - 99 mg/dL   BUN 10 6 - 23 mg/dL   Creatinine, Ser 0.88 0.40 - 1.20 mg/dL    Calcium 8.9 8.4 - 10.5 mg/dL   GFR 74.90 >60.00 mL/min   Assessment & Plan:   Problem List Items Addressed This Visit    Obesity, Class I, BMI 30.0-34.9 (see actual BMI)    Encouraged ongoing healthy diet choices to affect sustainable weight loss. Will refer to nutritionist per pt preference. Could consider bariatric medication.       Relevant Orders   Amb ref to Medical Nutrition Therapy-MNT   Hypothyroidism - Primary    Chronic, stable. Update TFTs and if normal will continue current regimen. Pt agrees with plan.       Relevant Orders   TSH   T4, free   Basic metabolic panel       Meds ordered this encounter  Medications  . valACYclovir (VALTREX) 500 MG tablet    Sig: Take 1 tablet (500 mg total) by mouth 2 (two) times daily. For 3 days per course.    Dispense:  30 tablet    Refill:  3   Orders Placed This Encounter  Procedures  . TSH  . T4, free  . Basic metabolic panel  . Amb ref to Medical Nutrition Therapy-[facility]NT    Referral Priority:   Routine    Referral Type:   Consultation    Referral Reason:   Specialty Services Required    Requested Specialty:   Nutrition    Number of Visits Requested:   1    Follow up plan: Return in about 1 year (around 07/10/2019) for follow up visit.  Gabrielle Bush, MD

## 2018-07-09 ENCOUNTER — Ambulatory Visit (INDEPENDENT_AMBULATORY_CARE_PROVIDER_SITE_OTHER): Payer: 59 | Admitting: Family Medicine

## 2018-07-09 ENCOUNTER — Encounter: Payer: Self-pay | Admitting: Family Medicine

## 2018-07-09 ENCOUNTER — Other Ambulatory Visit: Payer: Self-pay | Admitting: Family Medicine

## 2018-07-09 VITALS — BP 120/82 | HR 68 | Temp 98.2°F | Ht 64.0 in | Wt 192.5 lb

## 2018-07-09 DIAGNOSIS — E669 Obesity, unspecified: Secondary | ICD-10-CM | POA: Diagnosis not present

## 2018-07-09 DIAGNOSIS — E039 Hypothyroidism, unspecified: Secondary | ICD-10-CM

## 2018-07-09 LAB — BASIC METABOLIC PANEL
BUN: 11 mg/dL (ref 6–23)
CO2: 28 meq/L (ref 19–32)
Calcium: 9.2 mg/dL (ref 8.4–10.5)
Chloride: 104 mEq/L (ref 96–112)
Creatinine, Ser: 0.89 mg/dL (ref 0.40–1.20)
GFR: 73.48 mL/min (ref >60.00)
GLUCOSE: 98 mg/dL (ref 70–99)
POTASSIUM: 4 meq/L (ref 3.5–5.1)
SODIUM: 140 meq/L (ref 135–145)

## 2018-07-09 LAB — TSH: TSH: 1.57 u[IU]/mL (ref 0.35–4.50)

## 2018-07-09 LAB — T4, FREE: Free T4: 1.29 ng/dL (ref 0.60–1.60)

## 2018-07-09 MED ORDER — VALACYCLOVIR HCL 500 MG PO TABS: 500.0000 mg | ORAL_TABLET | Freq: Two times a day (BID) | ORAL | 3 refills | Status: DC

## 2018-07-09 MED ORDER — LEVOTHYROXINE SODIUM 88 MCG PO TABS: ORAL_TABLET | ORAL | 3 refills | Status: DC

## 2018-07-09 NOTE — Assessment & Plan Note (Addendum)
Encouraged ongoing healthy diet choices to affect sustainable weight loss. Will refer to nutritionist per pt preference. Could consider bariatric medication.

## 2018-07-09 NOTE — Patient Instructions (Addendum)
We will refer you to nutritionist for evaluation.  Labs today.  Continue levothyroxine 88mg  daily.

## 2018-07-09 NOTE — Assessment & Plan Note (Signed)
Chronic, stable. Update TFTs and if normal will continue current regimen. Pt agrees with plan.

## 2018-08-22 ENCOUNTER — Emergency Department
Admission: EM | Admit: 2018-08-22 | Discharge: 2018-08-22 | Disposition: A | Discharge status: Home / Self Care | Payer: 59 | Attending: Emergency Medicine | Admitting: Emergency Medicine

## 2018-08-22 ENCOUNTER — Other Ambulatory Visit: Payer: Self-pay

## 2018-08-22 ENCOUNTER — Encounter: Payer: Self-pay | Admitting: Intensive Care

## 2018-08-22 DIAGNOSIS — S61412A Laceration without foreign body of left hand, initial encounter: Secondary | ICD-10-CM | POA: Insufficient documentation

## 2018-08-22 DIAGNOSIS — Z79899 Other long term (current) drug therapy: Secondary | ICD-10-CM | POA: Diagnosis not present

## 2018-08-22 DIAGNOSIS — Y9389 Activity, other specified: Secondary | ICD-10-CM | POA: Diagnosis not present

## 2018-08-22 DIAGNOSIS — Z87891 Personal history of nicotine dependence: Secondary | ICD-10-CM | POA: Insufficient documentation

## 2018-08-22 DIAGNOSIS — W260XXA Contact with knife, initial encounter: Secondary | ICD-10-CM | POA: Diagnosis not present

## 2018-08-22 DIAGNOSIS — Z23 Encounter for immunization: Secondary | ICD-10-CM | POA: Insufficient documentation

## 2018-08-22 DIAGNOSIS — Y92000 Kitchen of unspecified non-institutional (private) residence as  the place of occurrence of the external cause: Secondary | ICD-10-CM | POA: Insufficient documentation

## 2018-08-22 DIAGNOSIS — F319 Bipolar disorder, unspecified: Secondary | ICD-10-CM | POA: Diagnosis not present

## 2018-08-22 DIAGNOSIS — Y998 Other external cause status: Secondary | ICD-10-CM | POA: Insufficient documentation

## 2018-08-22 MED ORDER — LIDOCAINE HCL (PF) 1 % IJ SOLN
5.0000 mL | Freq: Once | INTRAMUSCULAR | Status: AC
  Administered 2018-08-22: 5 mL
  Filled 2018-08-22: qty 5

## 2018-08-22 MED ORDER — IBUPROFEN 800 MG PO TABS
800.0000 mg | ORAL_TABLET | Freq: Once | ORAL | Status: AC
Start: 2018-08-22 — End: 2018-08-22
  Administered 2018-08-22: 800 mg via ORAL
  Filled 2018-08-22: qty 1

## 2018-08-22 MED ORDER — ACETAMINOPHEN 325 MG PO TABS
650.0000 mg | ORAL_TABLET | Freq: Once | ORAL | Status: AC
  Administered 2018-08-22: 650 mg via ORAL
  Filled 2018-08-22: qty 2

## 2018-08-22 MED ORDER — TETANUS-DIPHTH-ACELL PERTUSSIS 5-2.5-18.5 LF-MCG/0.5 IM SUSP
0.5000 mL | Freq: Once | INTRAMUSCULAR | Status: AC
  Administered 2018-08-22: 0.5 mL via INTRAMUSCULAR
  Filled 2018-08-22: qty 0.5

## 2018-08-22 NOTE — ED Notes (Signed)
Pt verbalized understanding of discharge instructions. NAD at this time. 

## 2018-08-22 NOTE — ED Triage Notes (Signed)
Patient cut herself with case knife at home this AM. Bleeding controlled with pressure.

## 2018-08-22 NOTE — Discharge Instructions (Signed)
Keep the wound clean, dry, and covered. Follow-up with Dr. Darnell Level for suture removal in 10 days.

## 2018-08-22 NOTE — ED Notes (Signed)
Pt cut herself this am while using knife. Bleeding controlled at this time

## 2018-08-22 NOTE — ED Provider Notes (Signed)
[facility] Emergency Department Provider Note ____________________________________________  Time seen: 1049  I have reviewed the triage vital signs and the nursing notes.  HISTORY  Chief Complaint  Extremity Laceration  HPI Gabrielle Dominguez is a 43 y.o. female presents to the ED from home, for evaluation of accidental laceration to the left hand.  Patient describes she was using a butter knife, to remove a pair of blinds from the kitchen.  Her hand slipped, causing her to cut across the dorsal aspect of her left hand in the interspace between the thumb and index finger.  She presents now with bleeding controlled to a small dorsal hand laceration patient denies any other injury at this time she is unclear of a tetanus status.  Past Medical History:  Diagnosis Date  . Abnormal drug screen 02/2015   inapprop pos oxycodone (02/2015)  . Abnormal Pap smear   . Bipolar 2 disorder ([place])   . History of chicken pox   . History of endometriosis   . History of pneumonia 07/2010, 04/2011  . Hypothyroidism   . IBS (irritable bowel syndrome)   . Panic disorder   . Worsening headaches     Patient Active Problem List   Diagnosis Date Noted  . Adjustment disorder with depressed mood 03/24/2017  . S/P laparoscopic assisted vaginal hysterectomy (LAVH) 05/10/2014  . Bipolar 2 disorder ([place]) 04/15/2014  . Depression 03/29/2013  . Obesity, Class I, BMI 30.0-34.9 (see actual BMI) 08/20/2012  . Panic disorder 10/10/2011  . Hypothyroidism   . History of endometriosis 03/26/2011  . HEMATURIA UNSPECIFIED 02/04/2010    Past Surgical History:  Procedure Laterality Date  . BILATERAL SALPINGECTOMY N/A 05/10/2014   Procedure: BILATERAL SALPINGECTOMY;  Surgeon: Donnamae Jude, MD;  Location: [facility];  Service: Gynecology;  Laterality: N/A;  . CESAREAN SECTION  2000  . COLONOSCOPY  10/2012   normal, mult random biopsies obtained, normal [place] Plan)  . LAPAROSCOPIC ASSISTED VAGINAL  HYSTERECTOMY N/A 05/10/2014   Procedure: LAPAROSCOPIC ASSISTED VAGINAL HYSTERECTOMY;  Surgeon: Donnamae Jude, MD;  Location: [facility];  Service: Gynecology;  Laterality: N/A;  . LAPAROSCOPIC ENDOMETRIOSIS FULGURATION  2002 and 2009    Prior to Admission medications   Medication Sig Start Date End Date Taking? Authorizing Provider  levothyroxine (SYNTHROID, LEVOTHROID) 88 MCG tablet TAKE 1 TABLET BY MOUTH ONCE DAILY BEFORE BREAKFAST 07/09/18   Ria Bush, MD  valACYclovir (VALTREX) 500 MG tablet Take 1 tablet (500 mg total) by mouth 2 (two) times daily. For 3 days per course. 07/09/18   Ria Bush, MD    Allergies Penicillins  Family History  Problem Relation Age of Onset  . Heart attack Paternal Grandfather   . Hypertension Paternal Grandfather   . Hypertension Maternal Grandmother   . Hypertension Father   . Melanoma Father   . Cancer Mother        vulvar  . Hypertension Mother   . Stroke Mother   . Irritable bowel syndrome Mother   . Colon cancer Maternal Aunt 63  . Hypertension Brother   . Stroke Maternal Grandfather   . Melanoma Cousin   . Diabetes Neg Hx     Social History Social History   Tobacco Use  . Smoking status: Former Smoker    Types: Cigarettes    Last attempt to quit: 07/15/2002    Years since quitting: 16.1  . Smokeless tobacco: Never Used  Substance Use Topics  . Alcohol use: Yes    Alcohol/week: 0.0 standard drinks  Comment: rarely- social  . Drug use: No    Review of Systems  Constitutional: Negative for fever. Cardiovascular: Negative for chest pain. Respiratory: Negative for shortness of breath. Musculoskeletal: Negative for back pain. Skin: Negative for rash.  Left hand laceration as above. Neurological: Negative for headaches, focal weakness or numbness. ____________________________________________  PHYSICAL EXAM:  VITAL SIGNS: ED Triage Vitals  Enc Vitals Group     BP 08/22/18 0908 (!) 143/95     Pulse Rate 08/22/18  0908 88     Resp 08/22/18 0908 18     Temp 08/22/18 0908 98.4 F (36.9 C)     Temp Source 08/22/18 0908 Oral     SpO2 08/22/18 0908 100 %     Weight 08/22/18 0909 185 lb (83.9 kg)     Height 08/22/18 0909 5\' 4"  (1.626 m)     Head Circumference --      Peak Flow --      Pain Score 08/22/18 0909 9     Pain Loc --      Pain Edu? --      Excl. in [place]? --     Constitutional: Alert and oriented. Well appearing and in no distress. Head: Normocephalic and atraumatic. Eyes: Conjunctivae are normal. Normal extraocular movements Cardiovascular: Normal rate, regular rhythm. Normal distal pulses. Respiratory: Normal respiratory effort. . Musculoskeletal: Composite fist on the left hand.  Patient with a superficial laceration to the dorsum of the hand between the thumb and index finger.  No active bleeding is noted at this time.  Nontender with normal range of motion in all extremities.  Neurologic:  Normal gross sensation.  Normal intrinsic and opposition testing. Normal speech and language. No gross focal neurologic deficits are appreciated. Skin:  Skin is warm, dry and intact. No rash noted. ____________________________________________  PROCEDURES  Tdap 0.5 ml  Tylenol 650 mg PO IBU 800 mg PO  .Marland KitchenLaceration Repair Date/Time: 08/22/2018 11:02 AM Performed by: Melvenia Needles, PA-C Authorized by: Melvenia Needles, PA-C   Consent:    Consent obtained:  Verbal   Consent given by:  Patient   Risks discussed:  Pain   Alternatives discussed:  No treatment Anesthesia (see MAR for exact dosages):    Anesthesia method:  Local infiltration   Local anesthetic:  Lidocaine 1% w/o epi Laceration details:    Location:  Hand   Hand location:  L hand, dorsum   Length (cm):  1.5   Depth (mm):  2 Repair type:    Repair type:  Simple Pre-procedure details:    Preparation:  Patient was prepped and draped in usual sterile fashion Treatment:    Area cleansed with:  Betadine   Amount  of cleaning:  Standard   Irrigation solution:  Sterile saline   Irrigation method:  Syringe Skin repair:    Repair method:  Sutures   Suture size:  4-0   Suture material:  Nylon   Suture technique:  Simple interrupted   Number of sutures:  3 Approximation:    Approximation:  Close Post-procedure details:    Dressing:  Non-adherent dressing   Patient tolerance of procedure:  Tolerated well, no immediate complications  ____________________________________________  INITIAL IMPRESSION / ASSESSMENT AND PLAN / ED COURSE  Patient with ED evaluation of an axonal laceration to the left hand.  She presents with a superficial wound to the dorsal left hand.  No indication of any tendon or nerve injury at this time.  Patient's exam is benign.  Sutures are used to repair the wound, and wound care directions are provided.  She will see her provider in 7-10 days for suture removal. ____________________________________________  FINAL CLINICAL IMPRESSION(S) / ED DIAGNOSES  Final diagnoses:  Laceration of left hand without foreign body, initial encounter      Melvenia Needles, PA-C 08/22/18 1144    Earleen Newport, MD 08/22/18 1529

## 2018-09-01 ENCOUNTER — Encounter: Payer: Self-pay | Admitting: Primary Care

## 2018-09-01 ENCOUNTER — Ambulatory Visit (INDEPENDENT_AMBULATORY_CARE_PROVIDER_SITE_OTHER): Payer: 59 | Admitting: Primary Care

## 2018-09-01 DIAGNOSIS — Z4802 Encounter for removal of sutures: Secondary | ICD-10-CM | POA: Diagnosis not present

## 2018-09-01 NOTE — Patient Instructions (Signed)
Please notify me if you develop redness, green/white drainage, increased pain/swelling.  It was a pleasure meeting you!

## 2018-09-01 NOTE — Assessment & Plan Note (Signed)
Evaluated and treated at [facility] ED, three sutures placed and are intact on arrival. Wound appears to be healing well.  Three sutures removed to left dorsal hand, patient tolerated well.  Home care instructions provided.

## 2018-09-01 NOTE — Progress Notes (Signed)
Subjective:    Patient ID: Gabrielle Dominguez, female    DOB: July 01, 1976, 43 y.o.   MRN: [id number]  HPI  Gabrielle Dominguez is a 43 year old female who presents today for suture removal.  She was evaluated at [facility] ED on 08/22/18 with a chief complaint of accidental laceration to the left hand. She was using a butter knife to remove a window treatment from the kitchen, hand slipped and she cut herself on the dorsal left hand between the first and second digit.  During her stay in the ED she underwent simple repair of lacreation with 4-0 Nylon sutures with simple interrupted technique. Three sutures total. She was also provided with a tetanus vaccination.  Since her ED visit she denies redness, swelling, pain. She did experience a lot of bruising initially to the dorsal hand, some to the palmer hand. She had to sit in the lobby for two hours prior to evaluation. The bruising has improved but remains. She has noticed residual numbness to the site and around the site.   Review of Systems  Constitutional: Negative for fever.  Skin: Positive for wound.       Bruising   Neurological: Positive for numbness.       Past Medical History:  Diagnosis Date  . Abnormal drug screen 02/2015   inapprop pos oxycodone (02/2015)  . Abnormal Pap smear   . Bipolar 2 disorder ([place])   . History of chicken pox   . History of endometriosis   . History of pneumonia 07/2010, 04/2011  . Hypothyroidism   . IBS (irritable bowel syndrome)   . Panic disorder   . Worsening headaches      Social History   Socioeconomic History  . Marital status: Single    Spouse name: Not on file  . Number of children: 1  . Years of education: Not on file  . Highest education level: Not on file  Occupational History  . Occupation: Glass blower/designer  Social Needs  . Financial resource strain: Not on file  . Food insecurity:    Worry: Not on file    Inability: Not on file  . Transportation needs:    Medical: Not on file   Non-medical: Not on file  Tobacco Use  . Smoking status: Former Smoker    Types: Cigarettes    Last attempt to quit: 07/15/2002    Years since quitting: 16.1  . Smokeless tobacco: Never Used  Substance and Sexual Activity  . Alcohol use: Yes    Alcohol/week: 0.0 standard drinks    Comment: rarely- social  . Drug use: No  . Sexual activity: Yes    Partners: Male    Birth control/protection: Condom  Lifestyle  . Physical activity:    Days per week: Not on file    Minutes per session: Not on file  . Stress: Not on file  Relationships  . Social connections:    Talks on phone: Not on file    Gets together: Not on file    Attends religious service: Not on file    Active member of club or organization: Not on file    Attends meetings of clubs or organizations: Not on file    Relationship status: Not on file  . Intimate partner violence:    Fear of current or ex partner: Not on file    Emotionally abused: Not on file    Physically abused: Not on file    Forced sexual activity: Not on file  Other Topics Concern  . Not on file  Social History Narrative   Caffeine: rarely   Lives with fiance, son (2000), mother, 1 dog   Occupation: has 2 jobs   Edu: 12th grade   Activity: tries to walk   Diet: occasional fast food, lots of water, good fruits and vegetables, red meat 3x/wk    Past Surgical History:  Procedure Laterality Date  . BILATERAL SALPINGECTOMY N/A 05/10/2014   Procedure: BILATERAL SALPINGECTOMY;  Surgeon: Donnamae Jude, MD;  Location: [facility];  Service: Gynecology;  Laterality: N/A;  . CESAREAN SECTION  2000  . COLONOSCOPY  10/2012   normal, mult random biopsies obtained, normal [place] Plan)  . LAPAROSCOPIC ASSISTED VAGINAL HYSTERECTOMY N/A 05/10/2014   Procedure: LAPAROSCOPIC ASSISTED VAGINAL HYSTERECTOMY;  Surgeon: Donnamae Jude, MD;  Location: [facility];  Service: Gynecology;  Laterality: N/A;  . LAPAROSCOPIC ENDOMETRIOSIS FULGURATION  2002 and 2009    Family History    Problem Relation Age of Onset  . Heart attack Paternal Grandfather   . Hypertension Paternal Grandfather   . Hypertension Maternal Grandmother   . Hypertension Father   . Melanoma Father   . Cancer Mother        vulvar  . Hypertension Mother   . Stroke Mother   . Irritable bowel syndrome Mother   . Colon cancer Maternal Aunt 36  . Hypertension Brother   . Stroke Maternal Grandfather   . Melanoma Cousin   . Diabetes Neg Hx     Allergies  Allergen Reactions  . Penicillins Anaphylaxis and Hives    Current Outpatient Medications on File Prior to Visit  Medication Sig Dispense Refill  . levothyroxine (SYNTHROID, LEVOTHROID) 88 MCG tablet TAKE 1 TABLET BY MOUTH ONCE DAILY BEFORE BREAKFAST 90 tablet 3  . valACYclovir (VALTREX) 500 MG tablet Take 1 tablet (500 mg total) by mouth 2 (two) times daily. For 3 days per course. 30 tablet 3   No current facility-administered medications on file prior to visit.     BP 124/74   Pulse 80   Temp 98 F (36.7 C) (Oral)   Ht 5\' 4"  (1.626 m)   Wt 188 lb 8 oz (85.5 kg)   LMP 09/12/2011   SpO2 98%   BMI 32.36 kg/m    Objective:   Physical Exam  Constitutional: She appears well-nourished.  Skin: Skin is warm and dry.  1 inch linear/verticle well healing laceration with 3 sutures present. Bruising with light green/yellow color noted to dorsal hand. Minimal swelling. No drainage from site.           Assessment & Plan:

## 2018-09-22 ENCOUNTER — Encounter: Payer: Self-pay | Admitting: Radiology

## 2018-11-12 ENCOUNTER — Encounter: Payer: Self-pay | Admitting: Family Medicine

## 2018-11-12 NOTE — Telephone Encounter (Signed)
Sent to PCP for approval   Last OV 07/09/2018   Sent to PCP for approval

## 2018-11-12 NOTE — Telephone Encounter (Signed)
mychart video visit scheduled tomorrow at 3:30pm

## 2018-11-13 ENCOUNTER — Encounter: Payer: Self-pay | Admitting: Family Medicine

## 2018-11-13 ENCOUNTER — Telehealth (INDEPENDENT_AMBULATORY_CARE_PROVIDER_SITE_OTHER): Payer: 59 | Admitting: Family Medicine

## 2018-11-13 VITALS — Ht 64.0 in

## 2018-11-13 DIAGNOSIS — F411 Generalized anxiety disorder: Secondary | ICD-10-CM | POA: Insufficient documentation

## 2018-11-13 MED ORDER — ALPRAZOLAM 0.25 MG PO TABS: 0.2500 mg | ORAL_TABLET | Freq: Two times a day (BID) | ORAL | 0 refills | Status: DC | PRN

## 2018-11-13 NOTE — Progress Notes (Signed)
Virtual visit completed through [place]. Due to national recommendations of social distancing due to [place] 19, a virtual visit is felt to be most appropriate for this patient at this time.   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, office If any vitals were documented, they were collected by patient at home unless specified below.    Ht 5\' 4"  (1.626 m)   LMP 09/12/2011   BMI 32.36 kg/m    CC: discuss stress Subjective:    Patient ID: Gabrielle Dominguez, female    DOB: [birth year], 43 y.o.   MRN: [id number]  HPI: Gabrielle Dominguez is a 43 y.o. female presenting on 11/13/2018 for Anxiety (C/o stress and anxiety since caring for sick mother. )   Noticing increasing anxiety attacks due to work stress, mother with chronic lung problems on oxygen. Has been caring for her mom more recently. Also found out brother is going through health issues of his own.   Notes heart fluttering, increased nerves, trouble sleeping, decreased appetite. Feeling constantly on edge. Trouble shutting mind off at night.   Ongoing trouble over last 2-3 weeks.   H/o panic attacks in the past. Worried they may return.   Prior on xanax 0.5mg  - found it to be very sedating.       Relevant past medical, surgical, family and social history reviewed and updated as indicated. Interim medical history since our last visit reviewed. Allergies and medications reviewed and updated. Outpatient Medications Prior to Visit  Medication Sig Dispense Refill  . levothyroxine (SYNTHROID, LEVOTHROID) 88 MCG tablet TAKE 1 TABLET BY MOUTH ONCE DAILY BEFORE BREAKFAST 90 tablet 3  . valACYclovir (VALTREX) 500 MG tablet Take 1 tablet (500 mg total) by mouth 2 (two) times daily. For 3 days per course. 30 tablet 3   No facility-administered medications prior to visit.      Per HPI unless specifically indicated in ROS section below Review of Systems Objective:    Ht 5\' 4"  (1.626 m)   LMP 09/12/2011   BMI 32.36  kg/m   Wt Readings from Last 3 Encounters:  09/01/18 188 lb 8 oz (85.5 kg)  08/22/18 185 lb (83.9 kg)  07/09/18 192 lb 8 oz (87.3 kg)     Physical exam: Gen: alert, NAD, not ill appearing Pulm: speaks in complete sentences without increased work of breathing Psych: normal mood, normal thought content      Depression screen [facility] 2/9 11/13/2018 05/05/2017 03/24/2017  Decreased Interest 0 1 3  Down, Depressed, Hopeless 1 1 3   PHQ - 2 Score 1 2 6   Altered sleeping 2 0 3  Tired, decreased energy 2 1 3   Change in appetite 2 0 3  Feeling bad or failure about yourself  0 1 3  Trouble concentrating 1 1 3   Moving slowly or fidgety/restless 0 0 2  Suicidal thoughts 0 0 0  PHQ-9 Score 8 5 23     GAD 7 : Generalized Anxiety Score 11/13/2018 05/05/2017 03/24/2017  Nervous, Anxious, on Edge 2 1 3   Control/stop worrying 0 1 3  Worry too much - different things 3 1 3   Trouble relaxing 2 1 3   Restless 2 1 3   Easily annoyed or irritable 1 1 3   Afraid - awful might happen 1 1 2   Total GAD 7 Score 11 7 20      Assessment & Plan:   Problem List Items Addressed This Visit    Anxiety state - Primary    Increased work  and family caregiver stress developing into anxiety attacks.  Discussed treatment options as well as importance of healthy stress relieving strategies. She is interested in PRN medication for anxiety/stress and has previously used alprazolam 0.5mg  but found it sedating. Will prescribe 0.25mg  dose, discussed risks of benzo controlled substance including habit forming nature, addition/abuse, dependence and tolerance potential. Discussed if ongoing need would recommend changing to daily anxiety medication. She will contact me in 2-3 wks through mychart with an update. Pt agrees with plan.       Relevant Medications   ALPRAZolam (XANAX) 0.25 MG tablet       Meds ordered this encounter  Medications  . ALPRAZolam (XANAX) 0.25 MG tablet    Sig: Take 1 tablet (0.25 mg total) by mouth 2  (two) times daily as needed for anxiety.    Dispense:  30 tablet    Refill:  0   No orders of the defined types were placed in this encounter.   Follow up plan: No follow-ups on file.  Ria Bush, MD

## 2018-11-13 NOTE — Assessment & Plan Note (Addendum)
Increased work and family caregiver stress developing into anxiety attacks.  Discussed treatment options as well as importance of healthy stress relieving strategies. She is interested in PRN medication for anxiety/stress and has previously used alprazolam 0.5mg  but found it sedating. Will prescribe 0.25mg  dose, discussed risks of benzo controlled substance including habit forming nature, addition/abuse, dependence and tolerance potential. Discussed if ongoing need would recommend changing to daily anxiety medication. She will contact me in 2-3 wks through mychart with an update. Pt agrees with plan.

## 2018-12-09 ENCOUNTER — Ambulatory Visit (INDEPENDENT_AMBULATORY_CARE_PROVIDER_SITE_OTHER): Payer: 59 | Admitting: Family Medicine

## 2018-12-09 ENCOUNTER — Encounter: Payer: Self-pay | Admitting: Family Medicine

## 2018-12-09 ENCOUNTER — Other Ambulatory Visit: Payer: 59

## 2018-12-09 VITALS — Ht 64.0 in | Wt 180.0 lb

## 2018-12-09 DIAGNOSIS — R300 Dysuria: Secondary | ICD-10-CM | POA: Diagnosis not present

## 2018-12-09 LAB — POC URINALSYSI DIPSTICK (AUTOMATED)
Glucose, UA: NEGATIVE
Ketones, UA: NEGATIVE
Leukocytes, UA: NEGATIVE
Nitrite, UA: NEGATIVE
Protein, UA: NEGATIVE
Spec Grav, UA: >=1.03 — AB (ref 1.010–1.025)
Urobilinogen, UA: 1 E.U./dL
pH, UA: 5.5 (ref 5.0–8.0)

## 2018-12-09 MED ORDER — FLUCONAZOLE 150 MG PO TABS: 150.0000 mg | ORAL_TABLET | Freq: Once | ORAL | 0 refills | Status: AC

## 2018-12-09 MED ORDER — SULFAMETHOXAZOLE-TRIMETHOPRIM 800-160 MG PO TABS: 1.0000 | ORAL_TABLET | Freq: Two times a day (BID) | ORAL | 0 refills | Status: DC

## 2018-12-09 NOTE — Assessment & Plan Note (Signed)
New urinary symptoms over the last several days suspicious for acute cystitis. She did start son's left over amoxicillin - advised against this in the future. UA/micro with few blood cells, few cal ox crystals but symptoms not consistent with kidney stone. Anticipate partially treated UTI. Will cover with bactrim course. Pt requests diflucan PRN yeast infection with abx course.

## 2018-12-09 NOTE — Progress Notes (Signed)
Virtual visit completed through [place]. Due to national recommendations of social distancing due to COVID-19, a virtual visit is felt to be most appropriate for this patient at this time.   Patient location: in her car Provider location: [place] at [facility], office If any vitals were documented, they were collected by patient at home unless specified below.    Ht 5\' 4"  (1.626 m)   Wt 180 lb (81.6 kg)   LMP 09/12/2011   BMI 30.90 kg/m    CC: ?UTI Subjective:    Patient ID: Gabrielle Dominguez, female    DOB: 02/28/1976, 43 y.o.   MRN: [id number]  HPI: Gabrielle Dominguez is a 43 y.o. female presenting on 12/09/2018 for Abdominal Pain (C/o lower abd pain. Started on 12/06/18.); Dysuria (C/o pain during urination, urinary frequency and feeling of full bladder. ); and Back Pain (C/o low back pain. )   4d h/o lower back pain associated with increased urinary frequency, discomfort in pelvic area with urination. Some incomplete emptying. Some malaise. Urine has looked more cloudy as well.   No fever/chills, urinary urgency, hematuria, nausea/vomiting.   She tried some of son's left over amoxicillin.  No recent UTI.   Has been treating with azo with mild benefit.  Last azo pill was last night.      Relevant past medical, surgical, family and social history reviewed and updated as indicated. Interim medical history since our last visit reviewed. Allergies and medications reviewed and updated. Outpatient Medications Prior to Visit  Medication Sig Dispense Refill  . ALPRAZolam (XANAX) 0.25 MG tablet Take 1 tablet (0.25 mg total) by mouth 2 (two) times daily as needed for anxiety. 30 tablet 0  . levothyroxine (SYNTHROID, LEVOTHROID) 88 MCG tablet TAKE 1 TABLET BY MOUTH ONCE DAILY BEFORE BREAKFAST 90 tablet 3  . valACYclovir (VALTREX) 500 MG tablet Take 1 tablet (500 mg total) by mouth 2 (two) times daily. For 3 days per course. 30 tablet 3   No facility-administered medications prior to visit.       Per HPI unless specifically indicated in ROS section below Review of Systems Objective:    Ht 5\' 4"  (1.626 m)   Wt 180 lb (81.6 kg)   LMP 09/12/2011   BMI 30.90 kg/m   Wt Readings from Last 3 Encounters:  12/09/18 180 lb (81.6 kg)  09/01/18 188 lb 8 oz (85.5 kg)  08/22/18 185 lb (83.9 kg)     Physical exam: Gen: alert, NAD, not ill appearing Pulm: speaks in complete sentences without increased work of breathing Psych: normal mood, normal thought content      Results for orders placed or performed in visit on 12/09/18  POCT Urinalysis Dipstick (Automated)  Result Value Ref Range   Color, UA dark yellow    Clarity, UA clear    Glucose, UA Negative Negative   Bilirubin, UA 1+    Ketones, UA negative    Spec Grav, UA >=1.030 (A) 1.010 - 1.025   Blood, UA +/-    pH, UA 5.5 5.0 - 8.0   Protein, UA Negative Negative   Urobilinogen, UA 1.0 0.2 or 1.0 E.U./dL   Nitrite, UA negative    Leukocytes, UA Negative Negative   Assessment & Plan:   Problem List Items Addressed This Visit    Dysuria - Primary    New urinary symptoms over the last several days suspicious for acute cystitis. She did start son's left over amoxicillin - advised against this in the future.  UA/micro with few blood cells, few cal ox crystals but symptoms not consistent with kidney stone. Anticipate partially treated UTI. Will cover with bactrim course. Pt requests diflucan PRN yeast infection with abx course.       Relevant Orders   POCT Urinalysis Dipstick (Automated) (Completed)   Urine Culture       Meds ordered this encounter  Medications  . sulfamethoxazole-trimethoprim (BACTRIM DS) 800-160 MG tablet    Sig: Take 1 tablet by mouth 2 (two) times daily.    Dispense:  10 tablet    Refill:  0  . fluconazole (DIFLUCAN) 150 MG tablet    Sig: Take 1 tablet (150 mg total) by mouth once for 1 dose.    Dispense:  1 tablet    Refill:  0   Orders Placed This Encounter  Procedures  . Urine  Culture  . POCT Urinalysis Dipstick (Automated)    Follow up plan: No follow-ups on file.  Ria Bush, MD

## 2018-12-10 ENCOUNTER — Ambulatory Visit: Payer: 59 | Admitting: Family Medicine

## 2018-12-10 LAB — URINE CULTURE
MICRO NUMBER:: 510103
Result:: NO GROWTH
SPECIMEN QUALITY:: ADEQUATE

## 2019-01-01 ENCOUNTER — Telehealth: Payer: Self-pay

## 2019-01-01 NOTE — Telephone Encounter (Signed)
Agree. Needed to be physically seen. plz call Monday for an update on foot swelling after fire ant bites.

## 2019-01-01 NOTE — Telephone Encounter (Signed)
Pt was working outside on 12/30/18; pt had multiple bites from fire ants; pt took benadryl and using hydrocortisone cream and ice. Today lt foot is extremely swollen and tight, hurts and itches; hard to walk due to tightness of foot. The swelling has gone up into lt ankle. Pt concerned because she has lymphoedema. Pt is at work in [employer]. We do not have any available appts and Dr Darnell Level advised pt should be seen at [facility] this morning. Pt voiced understanding and will go to UC. FYI to Dr Darnell Level.

## 2019-01-04 NOTE — Telephone Encounter (Signed)
Pt did go to UC (in care everywhere) on Friday. Pt said that the doctor prescribed a prednisone taper, and abx x10 days. Pt said that the meds are working the swelling and redness have improved significantly. Pt advise that if sxs don't continue to improve or if she develops any new sxs to let us know asap. Pt verbalized understanding.  FYI to PCP

## 2019-01-04 NOTE — Telephone Encounter (Signed)
Noted! Thank you

## 2019-01-20 ENCOUNTER — Other Ambulatory Visit: Payer: Self-pay | Admitting: Obstetrics & Gynecology

## 2019-01-20 ENCOUNTER — Other Ambulatory Visit: Payer: Self-pay | Admitting: Family Medicine

## 2019-01-20 DIAGNOSIS — Z1231 Encounter for screening mammogram for malignant neoplasm of breast: Secondary | ICD-10-CM

## 2019-01-22 ENCOUNTER — Other Ambulatory Visit: Payer: Self-pay

## 2019-01-22 ENCOUNTER — Ambulatory Visit (INDEPENDENT_AMBULATORY_CARE_PROVIDER_SITE_OTHER): Payer: 59 | Admitting: *Deleted

## 2019-01-22 VITALS — BP 126/82 | HR 78

## 2019-01-22 DIAGNOSIS — Z113 Encounter for screening for infections with a predominantly sexual mode of transmission: Secondary | ICD-10-CM

## 2019-01-22 DIAGNOSIS — N898 Other specified noninflammatory disorders of vagina: Secondary | ICD-10-CM

## 2019-01-22 DIAGNOSIS — B373 Candidiasis of vulva and vagina: Secondary | ICD-10-CM | POA: Diagnosis not present

## 2019-01-22 MED ORDER — FLUCONAZOLE 150 MG PO TABS: 150.0000 mg | ORAL_TABLET | Freq: Once | ORAL | 3 refills | Status: AC

## 2019-01-22 NOTE — Progress Notes (Signed)
Patient seen and assessed by nursing staff during this encounter. I have reviewed the chart and agree with the documentation and plan.  Verita Schneiders, MD 01/22/2019 9:30 AM

## 2019-01-22 NOTE — Progress Notes (Signed)
SUBJECTIVE:  43 y.o. female complains of vaginal irritation and had been on an antibiotic last week. Pt has has BV in past.  Denies abnormal vaginal bleeding or significant pelvic pain or fever. No UTI symptoms. Denies history of known exposure to STD.  Patient's last menstrual period was 09/12/2011.  OBJECTIVE:  She appears well, afebrile. Urine dipstick: not done.  ASSESSMENT:  Vaginal Irritation     PLAN:  GC/Chlymadia, Trichomonas, BVAG, CVAG probe sent to lab. Treatment: To be determined once lab results are received ROV prn if symptoms persist or worsen.

## 2019-01-25 LAB — CERVICOVAGINAL ANCILLARY ONLY
Bacterial vaginitis: NEGATIVE
Candida vaginitis: POSITIVE — AB
Chlamydia: NEGATIVE
Neisseria Gonorrhea: NEGATIVE
Trichomonas: NEGATIVE

## 2019-01-26 ENCOUNTER — Other Ambulatory Visit: Payer: Self-pay | Admitting: Obstetrics & Gynecology

## 2019-01-26 DIAGNOSIS — B373 Candidiasis of vulva and vagina: Secondary | ICD-10-CM

## 2019-01-26 DIAGNOSIS — B3731 Acute candidiasis of vulva and vagina: Secondary | ICD-10-CM

## 2019-01-26 MED ORDER — FLUCONAZOLE 150 MG PO TABS: 150.0000 mg | ORAL_TABLET | Freq: Once | ORAL | 3 refills | Status: AC

## 2019-01-28 ENCOUNTER — Other Ambulatory Visit: Payer: Self-pay

## 2019-01-28 ENCOUNTER — Ambulatory Visit
Admission: RE | Admit: 2019-01-28 | Discharge: 2019-01-28 | Disposition: A | Discharge status: Home / Self Care | Payer: 59 | Source: Ambulatory Visit | Attending: Family Medicine | Admitting: Family Medicine

## 2019-01-28 DIAGNOSIS — Z1231 Encounter for screening mammogram for malignant neoplasm of breast: Secondary | ICD-10-CM | POA: Diagnosis present

## 2019-01-28 LAB — HM MAMMOGRAPHY

## 2019-01-29 ENCOUNTER — Encounter: Payer: Self-pay | Admitting: Family Medicine

## 2019-02-10 ENCOUNTER — Other Ambulatory Visit: Payer: Self-pay | Admitting: Family Medicine

## 2019-02-10 NOTE — Telephone Encounter (Signed)
Last filled on 11/13/2018 for #30 with 0 refill. LOV was 12/09/2018 for Dysuria. No future appointments.

## 2019-02-11 MED ORDER — ALPRAZOLAM 0.25 MG PO TABS: 0.2500 mg | ORAL_TABLET | Freq: Two times a day (BID) | ORAL | 0 refills | Status: DC | PRN

## 2019-02-11 NOTE — Telephone Encounter (Signed)
Eprescribed.

## 2019-02-25 ENCOUNTER — Ambulatory Visit: Payer: 59 | Admitting: Obstetrics & Gynecology

## 2019-05-25 ENCOUNTER — Encounter: Payer: Self-pay | Admitting: Family Medicine

## 2019-05-25 MED ORDER — ALPRAZOLAM 0.25 MG PO TABS: 0.2500 mg | ORAL_TABLET | Freq: Two times a day (BID) | ORAL | 0 refills | Status: DC | PRN

## 2019-05-25 NOTE — Telephone Encounter (Signed)
Name of Medication: Alprazolam Name of Pharmacy: [facility] Last Fill or Written Date and Quantity: 02/11/19, #30/0 Last Office Visit and Type: 11/13/18, anxiety f/u Next Office Visit and Type: none Last Controlled Substance Agreement Date: 02/13/15 Last UDS: 02/13/15

## 2019-06-01 ENCOUNTER — Encounter: Payer: Self-pay | Admitting: Obstetrics & Gynecology

## 2019-06-01 ENCOUNTER — Ambulatory Visit (INDEPENDENT_AMBULATORY_CARE_PROVIDER_SITE_OTHER): Payer: 59 | Admitting: Obstetrics & Gynecology

## 2019-06-01 ENCOUNTER — Other Ambulatory Visit: Payer: Self-pay

## 2019-06-01 VITALS — BP 126/78 | HR 74 | Ht 64.0 in | Wt 194.0 lb

## 2019-06-01 DIAGNOSIS — Z113 Encounter for screening for infections with a predominantly sexual mode of transmission: Secondary | ICD-10-CM

## 2019-06-01 DIAGNOSIS — N898 Other specified noninflammatory disorders of vagina: Secondary | ICD-10-CM | POA: Diagnosis not present

## 2019-06-01 DIAGNOSIS — Z01419 Encounter for gynecological examination (general) (routine) without abnormal findings: Secondary | ICD-10-CM | POA: Diagnosis not present

## 2019-06-01 DIAGNOSIS — N951 Menopausal and female climacteric states: Secondary | ICD-10-CM | POA: Diagnosis not present

## 2019-06-01 DIAGNOSIS — Z0000 Encounter for general adult medical examination without abnormal findings: Secondary | ICD-10-CM

## 2019-06-01 NOTE — Progress Notes (Signed)
Pt feels she may be premenopausal, having vaginal dryness, more emotional and irritable X 4 months or longer

## 2019-06-01 NOTE — Progress Notes (Signed)
Subjective:    Gabrielle Dominguez is a 43 y.o. single P38 (44 yo son)  who presents for an annual exam. She is having hot flashes, dry vagina, moodiness with crying for 4-5 months.  The patient is sexually active. GYN screening history: last pap: was normal. The patient wears seatbelts: yes. The patient participates in regular exercise: yes. Has the patient ever been transfused or tattooed?: yes. The patient reports that there is not domestic violence in her life.   Menstrual History: OB History    Gravida  2   Para  1   Term  1   Preterm      AB  1   Living  1     SAB  1   TAB      Ectopic      Multiple      Live Births  1           Menarche age: 25 Patient's last menstrual period was 09/12/2011.    The following portions of the patient's history were reviewed and updated as appropriate: allergies, current medications, past family history, past medical history, past social history, past surgical history and problem list.  Review of Systems Pertinent items are noted in HPI.   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 She has been monogamous for a year, sometimes uses lubricant with sex. She declines a flu vaccine. Last mammogram 5/20 Dr. Leo Grosser is her fam med doc   Objective:    BP 126/78   Pulse 74   Ht 5\' 4"  (1.626 m)   Wt 194 lb (88 kg)   LMP 09/12/2011   BMI 33.30 kg/m   General Appearance:    Alert, cooperative, no distress, appears stated age  Head:    Normocephalic, without obvious abnormality, atraumatic  Eyes:    PERRL, conjunctiva/corneas clear, EOM's intact, fundi    benign, both eyes  Ears:    Normal TM's and external ear canals, both ears  Nose:   Nares normal, septum midline, mucosa normal, no drainage    or sinus tenderness  Throat:   Lips, mucosa, and tongue normal; teeth and gums normal  Neck:   Supple, symmetrical, trachea  midline, no adenopathy;    thyroid:  no enlargement/tenderness/nodules; no carotid   bruit or JVD  Back:     Symmetric, no curvature, ROM normal, no CVA tenderness  Lungs:     Clear to auscultation bilaterally, respirations unlabored  Chest Wall:    No tenderness or deformity   Heart:    Regular rate and rhythm, S1 and S2 normal, no murmur, rub   or gallop  Breast Exam:    No tenderness, masses, or nipple abnormality  Abdomen:     Soft, non-tender, bowel sounds active all four quadrants,    no masses, no organomegaly  Genitalia:    Normal female without lesion, discharge or tenderness, normal discharge seen, no masses or pain with bimanual     Extremities:   Extremities normal, atraumatic, no cyanosis or edema  Pulses:   2+ and symmetric all extremities  Skin:   Skin color, texture, turgor normal, no rashes or lesions  Lymph nodes:   Cervical, supraclavicular, and axillary nodes normal  Neurologic:   CNII-XII intact, normal strength, sensation and reflexes    throughout  .    Assessment:    Healthy female exam.  Plan:     Discussed healthy lifestyle modifications.

## 2019-06-02 LAB — HEPATITIS B SURFACE ANTIGEN: Hepatitis B Surface Ag: NEGATIVE

## 2019-06-02 LAB — CERVICOVAGINAL ANCILLARY ONLY
Bacterial Vaginitis (gardnerella): NEGATIVE
Candida Glabrata: NEGATIVE
Candida Vaginitis: NEGATIVE
Chlamydia: NEGATIVE
Comment: NEGATIVE
Comment: NEGATIVE
Comment: NEGATIVE
Comment: NEGATIVE
Comment: NEGATIVE
Comment: NORMAL
Neisseria Gonorrhea: NEGATIVE
Trichomonas: NEGATIVE

## 2019-06-02 LAB — HIV ANTIBODY (ROUTINE TESTING W REFLEX): HIV Screen 4th Generation wRfx: NONREACTIVE

## 2019-06-02 LAB — HEPATITIS C ANTIBODY: Hep C Virus Ab: <0.1 s/co ratio (ref 0.0–0.9)

## 2019-06-02 LAB — FOLLICLE STIMULATING HORMONE: FSH: 11.7 m[IU]/mL

## 2019-06-02 LAB — RPR: RPR Ser Ql: NONREACTIVE

## 2019-07-28 ENCOUNTER — Telehealth: Payer: Self-pay | Admitting: Family Medicine

## 2019-07-28 NOTE — Telephone Encounter (Signed)
E-scribed refill.  Pls schedule for CPE and lab visits.

## 2019-07-29 NOTE — Telephone Encounter (Signed)
Noted  

## 2019-07-29 NOTE — Telephone Encounter (Signed)
Patient scheduled.

## 2019-08-12 ENCOUNTER — Other Ambulatory Visit: Payer: Self-pay | Admitting: Family Medicine

## 2019-08-12 DIAGNOSIS — E039 Hypothyroidism, unspecified: Secondary | ICD-10-CM

## 2019-08-12 DIAGNOSIS — F3181 Bipolar II disorder: Secondary | ICD-10-CM

## 2019-08-20 ENCOUNTER — Other Ambulatory Visit (INDEPENDENT_AMBULATORY_CARE_PROVIDER_SITE_OTHER): Payer: 59

## 2019-08-20 ENCOUNTER — Other Ambulatory Visit: Payer: Self-pay

## 2019-08-20 DIAGNOSIS — E039 Hypothyroidism, unspecified: Secondary | ICD-10-CM

## 2019-08-20 LAB — BASIC METABOLIC PANEL
BUN: 10 mg/dL (ref 6–23)
CO2: 30 mEq/L (ref 19–32)
Calcium: 8.9 mg/dL (ref 8.4–10.5)
Chloride: 102 mEq/L (ref 96–112)
Creatinine, Ser: 0.8 mg/dL (ref 0.40–1.20)
GFR: 77.78 mL/min (ref >60.00)
Glucose, Bld: 83 mg/dL (ref 70–99)
Potassium: 4 mEq/L (ref 3.5–5.1)
Sodium: 138 mEq/L (ref 135–145)

## 2019-08-20 LAB — T4, FREE: Free T4: 1.15 ng/dL (ref 0.60–1.60)

## 2019-08-20 LAB — TSH: TSH: 1.73 u[IU]/mL (ref 0.35–4.50)

## 2019-08-27 ENCOUNTER — Encounter: Payer: Self-pay | Admitting: Family Medicine

## 2019-08-27 ENCOUNTER — Ambulatory Visit (INDEPENDENT_AMBULATORY_CARE_PROVIDER_SITE_OTHER): Payer: 59 | Admitting: Family Medicine

## 2019-08-27 ENCOUNTER — Other Ambulatory Visit: Payer: Self-pay

## 2019-08-27 VITALS — BP 124/82 | HR 89 | Temp 97.8°F | Ht 63.5 in | Wt 194.0 lb

## 2019-08-27 DIAGNOSIS — Z0000 Encounter for general adult medical examination without abnormal findings: Secondary | ICD-10-CM | POA: Insufficient documentation

## 2019-08-27 DIAGNOSIS — E669 Obesity, unspecified: Secondary | ICD-10-CM | POA: Diagnosis not present

## 2019-08-27 DIAGNOSIS — E039 Hypothyroidism, unspecified: Secondary | ICD-10-CM

## 2019-08-27 DIAGNOSIS — F3181 Bipolar II disorder: Secondary | ICD-10-CM | POA: Diagnosis not present

## 2019-08-27 DIAGNOSIS — F411 Generalized anxiety disorder: Secondary | ICD-10-CM

## 2019-08-27 DIAGNOSIS — F4321 Adjustment disorder with depressed mood: Secondary | ICD-10-CM

## 2019-08-27 MED ORDER — ARIPIPRAZOLE 5 MG PO TABS: 5.0000 mg | ORAL_TABLET | Freq: Every day | ORAL | 3 refills | Status: DC

## 2019-08-27 MED ORDER — LEVOTHYROXINE SODIUM 88 MCG PO TABS: 88.0000 ug | ORAL_TABLET | Freq: Every day | ORAL | 4 refills | Status: DC

## 2019-08-27 MED ORDER — ARIPIPRAZOLE 2 MG PO TABS: 2.0000 mg | ORAL_TABLET | Freq: Every day | ORAL | 3 refills | Status: DC

## 2019-08-27 NOTE — Assessment & Plan Note (Signed)
Chronic, stable. Continue current regimen. 

## 2019-08-27 NOTE — Assessment & Plan Note (Signed)
See above - fluctuating moods ?cycling of bipolar 2 (never true mania).

## 2019-08-27 NOTE — Assessment & Plan Note (Signed)
Encouraged ongoing efforts to affect sustainable weight loss.

## 2019-08-27 NOTE — Progress Notes (Signed)
This visit was conducted in person.  BP 124/82 (BP Location: Left Arm, Patient Position: Sitting, Cuff Size: Large)   Pulse 89   Temp 97.8 F (36.6 C) (Temporal)   Ht 5' 3.5" (1.613 m)   Wt 194 lb (88 kg)   LMP 09/12/2011   SpO2 100%   BMI 33.83 kg/m    CC: CPE Subjective:    Patient ID: Gabrielle Dominguez, female    DOB: Jun 12, 1976, 44 y.o.   MRN: [id number]  HPI: Gabrielle Dominguez is a 44 y.o. female presenting on 08/27/2019 for Annual Exam (Wants to discuss Xanax.  Says she has good days and other days are low. )   Has been going to counseling with ex-husband for son. This has been going well. Son recently diagnosed with anxiety as well.   Ongoing trouble with anxiety - some days feels very well "highs", some days feels depressed "lows". Has xanax but doesn't like using this due to sedation. She has previously seen psychiatrist Nicolasa Ducking) with ?bipolar 2 - prescribed depakote that actually worsened symptoms. More emotional the last 2 years - more sensitive. More irritable. No SI/HI. No mania. Remotely tried prozac (apathy), then lexapro and wellbutrin - doesn't remember if helpful. latuda previously unaffordable. She wonders if hormone changes may be contributing to mood issues.   Preventative: COLONOSCOPY 10/2012 - normal, mult random biopsies obtained, normal [facility] Plan) Well woman - s/p hysterectomy 2015, ovaries remain. Last saw OBGYN Hulan Fray) 05/2019. Mammogram 01/2019 - Birads1  Flu shot - missed this year Tdap 08/2018 Seat belt use discussed Sunscreen use discussed. No changing moles on skin. 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 Interim medical history since our last visit reviewed. Allergies and medications reviewed and updated. Outpatient Medications Prior to Visit  Medication Sig Dispense Refill  . ALPRAZolam (XANAX) 0.25 MG tablet Take 1 tablet (0.25 mg total) by mouth 2 (two) times daily as needed for anxiety. 30 tablet 0  . valACYclovir (VALTREX) 500 MG tablet Take 1 tablet (500 mg total) by mouth 2 (two) times daily. For 3 days per course. (Patient taking differently: Take 500 mg by mouth 2 (two) times daily. For 3 days per course.  As needed) 30 tablet 3  . EUTHYROX 88 MCG tablet TAKE 1 TABLET BY MOUTH ONCE DAILY BEFORE BREAKFAST 90 tablet 0  . sulfamethoxazole-trimethoprim (BACTRIM DS) 800-160 MG tablet Take 1 tablet by mouth 2 (two) times daily. (Patient not taking: Reported on 06/01/2019) 10 tablet 0   No facility-administered medications prior to visit.     Per HPI unless specifically indicated in ROS section below Review of Systems  Constitutional: Negative for activity change, appetite change, chills, fatigue, fever and unexpected weight change.  HENT: Negative for hearing loss.   Eyes: Negative for visual disturbance.  Respiratory: Negative for cough, chest tightness, shortness of breath and wheezing.   Cardiovascular: Negative for chest pain, palpitations and leg swelling.  Gastrointestinal: Negative for abdominal distention, abdominal pain, blood in stool, constipation, diarrhea, nausea and vomiting.  Genitourinary: Negative for difficulty urinating and hematuria.  Musculoskeletal: Negative for arthralgias, myalgias and neck pain.  Skin: Negative for rash.  Neurological: Negative for dizziness, seizures, syncope and headaches.  Hematological: Negative for adenopathy. Bruises/bleeds easily.  Psychiatric/Behavioral: Positive for dysphoric mood. The patient is nervous/anxious.    Objective:    BP 124/82 (BP Location: Left Arm, Patient  Position: Sitting, Cuff Size: Large)   Pulse 89   Temp 97.8 F (36.6 C) (Temporal)   Ht 5' 3.5" (1.613 m)   Wt 194 lb (88 kg)   LMP 09/12/2011   SpO2 100%   BMI 33.83 kg/m   Wt Readings from Last 3 Encounters:  08/27/19 194 lb (88 kg)  06/01/19 194 lb (88 kg)  12/09/18 180 lb (81.6 kg)    Physical Exam Vitals and nursing note reviewed.  Constitutional:      General: She is not in acute distress.    Appearance: Normal appearance. She is well-developed. She is not ill-appearing.  HENT:     Head: Normocephalic and atraumatic.     Right Ear: Hearing, tympanic membrane, ear canal and external ear normal.     Left Ear: Hearing, tympanic membrane, ear canal and external ear normal.     Mouth/Throat:     Pharynx: Uvula midline.  Eyes:     General: No scleral icterus.    Extraocular Movements: Extraocular movements intact.     Conjunctiva/sclera: Conjunctivae normal.     Pupils: Pupils are equal, round, and reactive to light.  Neck:     Thyroid: No thyromegaly or thyroid tenderness.  Cardiovascular:     Rate and Rhythm: Normal rate and regular rhythm.     Pulses: Normal pulses.          Radial pulses are 2+ on the right side and 2+ on the left side.     Heart sounds: Normal heart sounds. No murmur.  Pulmonary:     Effort: Pulmonary effort is normal. No respiratory distress.     Breath sounds: Normal breath sounds. No wheezing, rhonchi or rales.  Abdominal:     General: Abdomen is flat. Bowel sounds are normal. There is no distension.     Palpations: Abdomen is soft. There is no mass.     Tenderness: There is no abdominal tenderness. There is no guarding or rebound.     Hernia: No hernia is present.     Comments: L abdominal wall with firm lump, nontender  Musculoskeletal:        General: Normal range of motion.     Cervical back: Normal range of motion and neck supple.     Right lower leg: No edema.     Left lower leg: No edema.  Lymphadenopathy:     Cervical: No cervical  adenopathy.  Skin:    General: Skin is warm and dry.     Findings: No rash.  Neurological:     General: No focal deficit present.     Mental Status: She is alert and oriented to person, place, and time.     Comments: CN grossly intact, station and gait intact  Psychiatric:        Mood and Affect: Mood normal.        Behavior: Behavior normal.        Thought Content: Thought content normal.        Judgment: Judgment normal.       Results for orders placed or performed in visit on 08/20/19  T4, free  Result Value Ref Range   Free T4  1.15 0.60 - 1.60 ng/dL  TSH  Result Value Ref Range   TSH 1.73 0.35 - 4.50 uIU/mL  Basic metabolic panel  Result Value Ref Range   Sodium 138 135 - 145 mEq/L   Potassium 4.0 3.5 - 5.1 mEq/L   Chloride 102 96 - 112 mEq/L   CO2 30 19 - 32 mEq/L   Glucose, Bld 83 70 - 99 mg/dL   BUN 10 6 - 23 mg/dL   Creatinine, Ser 0.80 0.40 - 1.20 mg/dL   GFR 77.78 >60.00 mL/min   Calcium 8.9 8.4 - 10.5 mg/dL   Depression screen [facility] 2/9 08/27/2019 11/13/2018 05/05/2017 03/24/2017  Decreased Interest 1 0 1 3  Down, Depressed, Hopeless 1 1 1 3   PHQ - 2 Score 2 1 2 6   Altered sleeping 1 2 0 3  Tired, decreased energy 2 2 1 3   Change in appetite 1 2 0 3  Feeling bad or failure about yourself  1 0 1 3  Trouble concentrating 1 1 1 3   Moving slowly or fidgety/restless 0 0 0 2  Suicidal thoughts 0 0 0 0  PHQ-9 Score 8 8 5 23     GAD 7 : Generalized Anxiety Score 08/27/2019 11/13/2018 05/05/2017 03/24/2017  Nervous, Anxious, on Edge 1 2 1 3   Control/stop worrying 1 0 1 3  Worry too much - different things 2 3 1 3   Trouble relaxing 2 2 1 3   Restless 2 2 1 3   Easily annoyed or irritable 1 1 1 3   Afraid - awful might happen 0 1 1 2   Total GAD 7 Score 9 11 7 20    MDQ score = 6 - negative screen.  Assessment & Plan:  This visit occurred during the SARS-CoV-2 public health emergency.  Safety protocols were in place, including screening questions prior to the visit,  additional usage of staff PPE, and extensive cleaning of exam room while observing appropriate contact time as indicated for disinfecting solutions.   Problem List Items Addressed This Visit    Obesity, Class I, BMI 30.0-34.9 (see actual BMI)    Encouraged ongoing efforts to affect sustainable weight loss.       Hypothyroidism    Chronic, stable. Continue current regimen.       Relevant Medications   levothyroxine (EUTHYROX) 88 MCG tablet   Health maintenance examination - Primary    Preventative protocols reviewed and updated unless pt declined. Discussed healthy diet and lifestyle.       Bipolar 2 disorder [facility])    H/o this per psychiatry. She did not tolerate mood stabilizer depakote. She has not done well with single antidepressants in the past. Will trial low dose abilify 2mg  with option to titrate. RTC 4-6 wks f/u visit. Previously did well with wellbutrin. Consider return to psych. She is currently seeing family counselor with benefit. Not interested in individual counseling at this time.   Never SI. Advised monitor for suicidal thoughts with any antidepressant /psychotropic commencement, monitor specifically for tremors, dystonia on abilify.      Anxiety state    Continue rare xanax use.       Adjustment disorder with depressed mood    See above - fluctuating moods ?cycling of bipolar 2 (never true mania).          Meds ordered this encounter  Medications  . levothyroxine (EUTHYROX) 88 MCG tablet    Sig: Take 1 tablet (88 mcg total) by mouth daily before breakfast.    Dispense:  90 tablet    Refill:  4  . DISCONTD: ARIPiprazole (ABILIFY) 5 MG tablet    Sig: Take 1 tablet (5 mg total) by mouth daily.    Dispense:  30 tablet    Refill:  3  . ARIPiprazole (ABILIFY) 2 MG tablet    Sig: Take 1 tablet (2 mg total) by mouth daily.    Dispense:  30 tablet    Refill:  3    Use this dose   No orders of the defined types were placed in this encounter.   Patient  instructions: Trial abilify 2mg  daily in the morning. Return in 4-6 wks f/u visit.  Good to see you today  Follow up plan: Return in about 6 weeks (around 10/08/2019) for follow up visit.  Ria Bush, MD

## 2019-08-27 NOTE — Assessment & Plan Note (Signed)
Continue rare xanax use.  

## 2019-08-27 NOTE — Patient Instructions (Addendum)
Trial abilify 2mg  daily in the morning. Return in 4-6 wks f/u visit.  Good to see you today  Health Maintenance, Female Adopting a healthy lifestyle and getting preventive care are important in promoting health and wellness. Ask your health care provider about:  The right schedule for you to have regular tests and exams.  Things you can do on your own to prevent diseases and keep yourself healthy. What should I know about diet, weight, and exercise? Eat a healthy diet   Eat a diet that includes plenty of vegetables, fruits, low-fat dairy products, and lean protein.  Do not eat a lot of foods that are high in solid fats, added sugars, or sodium. Maintain a healthy weight Body mass index (BMI) is used to identify weight problems. It estimates body fat based on height and weight. Your health care provider can help determine your BMI and help you achieve or maintain a healthy weight. Get regular exercise Get regular exercise. This is one of the most important things you can do for your health. Most adults should:  Exercise for at least 150 minutes each week. The exercise should increase your heart rate and make you sweat (moderate-intensity exercise).  Do strengthening exercises at least twice a week. This is in addition to the moderate-intensity exercise.  Spend less time sitting. Even light physical activity can be beneficial. Watch cholesterol and blood lipids Have your blood tested for lipids and cholesterol at 45 years of age, then have this test every 5 years. Have your cholesterol levels checked more often if:  Your lipid or cholesterol levels are high.  You are older than 45 years of age.  You are at high risk for heart disease. What should I know about cancer screening? Depending on your health history and family history, you may need to have cancer screening at various ages. This may include screening for:  Breast cancer.  Cervical cancer.  Colorectal cancer.  Skin  cancer.  Lung cancer. What should I know about heart disease, diabetes, and high blood pressure? Blood pressure and heart disease  High blood pressure causes heart disease and increases the risk of stroke. This is more likely to develop in people who have high blood pressure readings, are of African descent, or are overweight.  Have your blood pressure checked: ? Every 3-5 years if you are 14-40 years of age. ? Every year if you are 34 years old or older. Diabetes Have regular diabetes screenings. This checks your fasting blood sugar level. Have the screening done:  Once every three years after age 64 if you are at a normal weight and have a low risk for diabetes.  More often and at a younger age if you are overweight or have a high risk for diabetes. What should I know about preventing infection? Hepatitis B If you have a higher risk for hepatitis B, you should be screened for this virus. Talk with your health care provider to find out if you are at risk for hepatitis B infection. Hepatitis C Testing is recommended for:  Everyone born from [birth year] through 1966.  Anyone with known risk factors for hepatitis C. Sexually transmitted infections (STIs)  Get screened for STIs, including gonorrhea and chlamydia, if: ? You are sexually active and are younger than 45 years of age. ? You are older than 45 years of age and your health care provider tells you that you are at risk for this type of infection. ? Your sexual activity has changed since  you were last screened, and you are at increased risk for chlamydia or gonorrhea. Ask your health care provider if you are at risk.  Ask your health care provider about whether you are at high risk for HIV. Your health care provider may recommend a prescription medicine to help prevent HIV infection. If you choose to take medicine to prevent HIV, you should first get tested for HIV. You should then be tested every 3 months for as long as you are taking  the medicine. Pregnancy  If you are about to stop having your period (premenopausal) and you may become pregnant, seek counseling before you get pregnant.  Take 400 to 800 micrograms (mcg) of folic acid every day if you become pregnant.  Ask for birth control (contraception) if you want to prevent pregnancy. Osteoporosis and menopause Osteoporosis is a disease in which the bones lose minerals and strength with aging. This can result in bone fractures. If you are 49 years old or older, or if you are at risk for osteoporosis and fractures, ask your health care provider if you should:  Be screened for bone loss.  Take a calcium or vitamin D supplement to lower your risk of fractures.  Be given hormone replacement therapy (HRT) to treat symptoms of menopause. Follow these instructions at home: Lifestyle  Do not use any products that contain nicotine or tobacco, such as cigarettes, e-cigarettes, and chewing tobacco. If you need help quitting, ask your health care provider.  Do not use street drugs.  Do not share needles.  Ask your health care provider for help if you need support or information about quitting drugs. Alcohol use  Do not drink alcohol if: ? Your health care provider tells you not to drink. ? You are pregnant, may be pregnant, or are planning to become pregnant.  If you drink alcohol: ? Limit how much you use to 0-1 drink a day. ? Limit intake if you are breastfeeding.  Be aware of how much alcohol is in your drink. In the [place] one drink equals one 12 oz bottle of beer (355 mL), one 5 oz glass of wine (148 mL), or one 1 oz glass of hard liquor (44 mL). General instructions  Schedule regular health, dental, and eye exams.  Stay current with your vaccines.  Tell your health care provider if: ? You often feel depressed. ? You have ever been abused or do not feel safe at home. Summary  Adopting a healthy lifestyle and getting preventive care are important in  promoting health and wellness.  Follow your health care provider's instructions about healthy diet, exercising, and getting tested or screened for diseases.  Follow your health care provider's instructions on monitoring your cholesterol and blood pressure. This information is not intended to replace advice given to you by your health care provider. Make sure you discuss any questions you have with your health care provider. Document Revised: 06/24/2018 Document Reviewed: 06/24/2018 Elsevier Patient Education  2020 [employer].

## 2019-08-27 NOTE — Assessment & Plan Note (Signed)
H/o this per psychiatry. She did not tolerate mood stabilizer depakote. She has not done well with single antidepressants in the past. Will trial low dose abilify 2mg  with option to titrate. RTC 4-6 wks f/u visit. Previously did well with wellbutrin. Consider return to psych. She is currently seeing family counselor with benefit. Not interested in individual counseling at this time.   Never SI. Advised monitor for suicidal thoughts with any antidepressant /psychotropic commencement, monitor specifically for tremors, dystonia on abilify.

## 2019-08-27 NOTE — Assessment & Plan Note (Signed)
Preventative protocols reviewed and updated unless pt declined. Discussed healthy diet and lifestyle.  

## 2019-10-08 ENCOUNTER — Encounter: Payer: Self-pay | Admitting: Family Medicine

## 2019-10-08 ENCOUNTER — Ambulatory Visit (INDEPENDENT_AMBULATORY_CARE_PROVIDER_SITE_OTHER): Payer: 59 | Admitting: Family Medicine

## 2019-10-08 ENCOUNTER — Other Ambulatory Visit: Payer: Self-pay

## 2019-10-08 VITALS — BP 120/82 | HR 83 | Temp 97.9°F | Ht 63.5 in | Wt 193.1 lb

## 2019-10-08 DIAGNOSIS — F3181 Bipolar II disorder: Secondary | ICD-10-CM

## 2019-10-08 DIAGNOSIS — F411 Generalized anxiety disorder: Secondary | ICD-10-CM

## 2019-10-08 MED ORDER — ARIPIPRAZOLE 5 MG PO TABS: 5.0000 mg | ORAL_TABLET | Freq: Every day | ORAL | 11 refills | Status: DC

## 2019-10-08 NOTE — Assessment & Plan Note (Signed)
Recent episode suspicious for anxiety attack in setting of increased stress at work this week. Update if recurs.

## 2019-10-08 NOTE — Patient Instructions (Signed)
You are doing well  Increase abilify to 5mg  daily.  Update me with effect in 3-4 weeks, return for follow up in 3 months.

## 2019-10-08 NOTE — Progress Notes (Signed)
This visit was conducted in person.  BP 120/82 (BP Location: Left Arm, Patient Position: Sitting, Cuff Size: Large)   Pulse 83   Temp 97.9 F (36.6 C) (Temporal)   Ht 5' 3.5" (1.613 m)   Wt 193 lb 1 oz (87.6 kg)   LMP 09/12/2011   SpO2 97%   BMI 33.66 kg/m    CC: 1 mo f/u visit  Subjective:    Patient ID: Gabrielle Dominguez, female    DOB: 01/18/1976, 44 y.o.   MRN: [id number]  HPI: Gabrielle Dominguez is a 44 y.o. female presenting on 10/08/2019 for Follow-up (Here for 6 wk mood f/u.  Pt thinks she may have had a panic attack yesterday at work.  Experienced chest pain, elevated BP and tingling down right arm. )   See prior note for details - ongoing trouble with anxiety - continues counseling with ex and son - doing well. Has seenDr Nicolasa Ducking psychiatrist ?bipolar 2 - depakote worsened symptoms. Given concern for ?bipolar, last visit we started abilify 2mg  daily - she finds some improvement in managing stress, less anxious. Would like to try higher dose. Taking at 9pm due to some sedation on med. Tolerating well without tremors or dystonia.   This week had worse stress at work with ppl being out of work so overloading work on her - yesterday may have experienced panic attack - chest discomfort described as burning and dull pressure associated with R arm tingling. Some racing heart. No dyspnea or nausea or HA or dizziness, vision changes. She was more tired the rest of the day. Episode lasted 10 min then resolved on its own. BP 140/90.   She previously did well with wellbutrin.   Remotely tried prozac (apathy), then lexapro and wellbutrin. Latuda previously unaffordable.     Relevant past medical, surgical, family and social history reviewed and updated as indicated. Interim medical history since our last visit reviewed. Allergies and medications reviewed and updated. Outpatient Medications Prior to Visit  Medication Sig Dispense Refill  . levothyroxine (EUTHYROX) 88 MCG tablet Take 1 tablet  (88 mcg total) by mouth daily before breakfast. 90 tablet 4  . valACYclovir (VALTREX) 500 MG tablet Take 1 tablet (500 mg total) by mouth 2 (two) times daily. For 3 days per course. (Patient taking differently: Take 500 mg by mouth 2 (two) times daily. For 3 days per course.  As needed) 30 tablet 3  . ARIPiprazole (ABILIFY) 2 MG tablet Take 1 tablet (2 mg total) by mouth daily. 30 tablet 3  . ALPRAZolam (XANAX) 0.25 MG tablet Take 1 tablet (0.25 mg total) by mouth 2 (two) times daily as needed for anxiety. (Patient not taking: Reported on 10/08/2019) 30 tablet 0   No facility-administered medications prior to visit.     Per HPI unless specifically indicated in ROS section below Review of Systems Objective:    BP 120/82 (BP Location: Left Arm, Patient Position: Sitting, Cuff Size: Large)   Pulse 83   Temp 97.9 F (36.6 C) (Temporal)   Ht 5' 3.5" (1.613 m)   Wt 193 lb 1 oz (87.6 kg)   LMP 09/12/2011   SpO2 97%   BMI 33.66 kg/m   Wt Readings from Last 3 Encounters:  10/08/19 193 lb 1 oz (87.6 kg)  08/27/19 194 lb (88 kg)  06/01/19 194 lb (88 kg)    Physical Exam Vitals and nursing note reviewed.  Constitutional:      Appearance: Normal appearance. She is not ill-appearing.  Cardiovascular:     Rate and Rhythm: Normal rate and regular rhythm.     Pulses: Normal pulses.     Heart sounds: Normal heart sounds. No murmur.  Pulmonary:     Effort: Pulmonary effort is normal. No respiratory distress.     Breath sounds: Normal breath sounds. No wheezing, rhonchi or rales.  Skin:    Findings: No rash.  Neurological:     Mental Status: She is alert.     Comments: No tremor present  Psychiatric:        Mood and Affect: Mood normal.        Behavior: Behavior normal.        Thought Content: Thought content normal.        Judgment: Judgment normal.       Depression screen [facility] 2/9 10/08/2019 08/27/2019 11/13/2018 05/05/2017 03/24/2017  Decreased Interest 1 1 0 1 3  Down, Depressed,  Hopeless 1 1 1 1 3   PHQ - 2 Score 2 2 1 2 6   Altered sleeping 1 1 2  0 3  Tired, decreased energy 2 2 2 1 3   Change in appetite 1 1 2  0 3  Feeling bad or failure about yourself  0 1 0 1 3  Trouble concentrating 0 1 1 1 3   Moving slowly or fidgety/restless 1 0 0 0 2  Suicidal thoughts 0 0 0 0 0  PHQ-9 Score 7 8 8 5 23     GAD 7 : Generalized Anxiety Score 10/08/2019 08/27/2019 11/13/2018 05/05/2017  Nervous, Anxious, on Edge 1 1 2 1   Control/stop worrying 1 1 0 1  Worry too much - different things 1 2 3 1   Trouble relaxing 1 2 2 1   Restless 1 2 2 1   Easily annoyed or irritable 1 1 1 1   Afraid - awful might happen 0 0 1 1  Total GAD 7 Score 6 9 11 7    Assessment & Plan:  This visit occurred during the SARS-CoV-2 public health emergency.  Safety protocols were in place, including screening questions prior to the visit, additional usage of staff PPE, and extensive cleaning of exam room while observing appropriate contact time as indicated for disinfecting solutions.   Problem List Items Addressed This Visit    Bipolar 2 disorder ([place]) - Primary    Doing well on abilify 2mg  but reasonable to increase dose - will send in 5mg  daily. Update with effect in 2-3 wks, RTC 3 mo f/u visit.       Anxiety state    Recent episode suspicious for anxiety attack in setting of increased stress at work this week. Update if recurs.           Meds ordered this encounter  Medications  . ARIPiprazole (ABILIFY) 5 MG tablet    Sig: Take 1 tablet (5 mg total) by mouth daily.    Dispense:  30 tablet    Refill:  11    Use this dose   No orders of the defined types were placed in this encounter.   Patient Instructions  You are doing well  Increase abilify to 5mg  daily.  Update me with effect in 3-4 weeks, return for follow up in 3 months.    Follow up plan: Return in about 3 months (around 01/08/2020) for follow up visit.  Ria Bush, MD

## 2019-10-08 NOTE — Assessment & Plan Note (Signed)
Doing well on abilify 2mg  but reasonable to increase dose - will send in 5mg  daily. Update with effect in 2-3 wks, RTC 3 mo f/u visit.

## 2019-10-08 NOTE — Assessment & Plan Note (Deleted)
Recent episode suspicious for anxiety attack in setting of increased stress at work this week. Update if recurs.

## 2019-12-15 ENCOUNTER — Other Ambulatory Visit: Payer: Self-pay | Admitting: Family Medicine

## 2020-01-10 ENCOUNTER — Ambulatory Visit: Payer: 59 | Admitting: Family Medicine

## 2020-01-10 DIAGNOSIS — Z0289 Encounter for other administrative examinations: Secondary | ICD-10-CM

## 2020-01-24 ENCOUNTER — Other Ambulatory Visit: Payer: Self-pay | Admitting: Family Medicine

## 2020-01-24 ENCOUNTER — Other Ambulatory Visit: Payer: Self-pay | Admitting: Obstetrics & Gynecology

## 2020-01-24 DIAGNOSIS — Z1231 Encounter for screening mammogram for malignant neoplasm of breast: Secondary | ICD-10-CM

## 2020-02-11 ENCOUNTER — Ambulatory Visit
Admission: RE | Admit: 2020-02-11 | Discharge: 2020-02-11 | Disposition: A | Discharge status: Home / Self Care | Payer: 59 | Source: Ambulatory Visit | Attending: Family Medicine | Admitting: Family Medicine

## 2020-02-11 ENCOUNTER — Other Ambulatory Visit: Payer: Self-pay

## 2020-02-11 DIAGNOSIS — Z1231 Encounter for screening mammogram for malignant neoplasm of breast: Secondary | ICD-10-CM | POA: Diagnosis present

## 2020-08-30 ENCOUNTER — Other Ambulatory Visit: Payer: Self-pay | Admitting: Family Medicine

## 2020-08-31 NOTE — Telephone Encounter (Signed)
E-scribe refill.  Plz schedule labs and cpe.

## 2020-09-01 NOTE — Telephone Encounter (Signed)
Called and left vm for the patient to call our office back . NO details left. Needs to schedule cpe and labs if she calls back. EM

## 2020-10-01 ENCOUNTER — Other Ambulatory Visit: Payer: Self-pay | Admitting: Family Medicine

## 2020-10-01 DIAGNOSIS — E039 Hypothyroidism, unspecified: Secondary | ICD-10-CM

## 2020-10-03 ENCOUNTER — Other Ambulatory Visit (INDEPENDENT_AMBULATORY_CARE_PROVIDER_SITE_OTHER): Payer: 59

## 2020-10-03 ENCOUNTER — Other Ambulatory Visit: Payer: Self-pay

## 2020-10-03 DIAGNOSIS — E039 Hypothyroidism, unspecified: Secondary | ICD-10-CM

## 2020-10-03 LAB — T4, FREE: Free T4: 1.47 ng/dL (ref 0.60–1.60)

## 2020-10-03 LAB — COMPREHENSIVE METABOLIC PANEL
ALT: 13 U/L (ref 0–35)
AST: 12 U/L (ref 0–37)
Albumin: 4 g/dL (ref 3.5–5.2)
Alkaline Phosphatase: 58 U/L (ref 39–117)
BUN: 11 mg/dL (ref 6–23)
CO2: 28 mEq/L (ref 19–32)
Calcium: 9 mg/dL (ref 8.4–10.5)
Chloride: 104 mEq/L (ref 96–112)
Creatinine, Ser: 0.84 mg/dL (ref 0.40–1.20)
GFR: 83.87 mL/min (ref >60.00)
Glucose, Bld: 98 mg/dL (ref 70–99)
Potassium: 4.1 mEq/L (ref 3.5–5.1)
Sodium: 139 mEq/L (ref 135–145)
Total Bilirubin: 0.5 mg/dL (ref 0.2–1.2)
Total Protein: 6.3 g/dL (ref 6.0–8.3)

## 2020-10-03 LAB — LIPID PANEL
Cholesterol: 158 mg/dL (ref 0–200)
HDL: 57.9 mg/dL (ref >39.00)
LDL Cholesterol: 76 mg/dL (ref 0–99)
NonHDL: 99.7
Total CHOL/HDL Ratio: 3
Triglycerides: 118 mg/dL (ref 0.0–149.0)
VLDL: 23.6 mg/dL (ref 0.0–40.0)

## 2020-10-03 LAB — TSH: TSH: 1.79 u[IU]/mL (ref 0.35–4.50)

## 2020-10-09 ENCOUNTER — Encounter: Payer: Self-pay | Admitting: Family Medicine

## 2020-10-09 ENCOUNTER — Ambulatory Visit (INDEPENDENT_AMBULATORY_CARE_PROVIDER_SITE_OTHER): Payer: 59 | Admitting: Family Medicine

## 2020-10-09 ENCOUNTER — Other Ambulatory Visit: Payer: Self-pay

## 2020-10-09 VITALS — BP 118/78 | HR 81 | Temp 97.6°F | Ht 63.75 in | Wt 190.6 lb

## 2020-10-09 DIAGNOSIS — Z0000 Encounter for general adult medical examination without abnormal findings: Secondary | ICD-10-CM

## 2020-10-09 DIAGNOSIS — F411 Generalized anxiety disorder: Secondary | ICD-10-CM

## 2020-10-09 DIAGNOSIS — E669 Obesity, unspecified: Secondary | ICD-10-CM

## 2020-10-09 DIAGNOSIS — E039 Hypothyroidism, unspecified: Secondary | ICD-10-CM

## 2020-10-09 DIAGNOSIS — F3181 Bipolar II disorder: Secondary | ICD-10-CM

## 2020-10-09 DIAGNOSIS — I890 Lymphedema, not elsewhere classified: Secondary | ICD-10-CM | POA: Diagnosis not present

## 2020-10-09 MED ORDER — LEVOTHYROXINE SODIUM 88 MCG PO TABS: 88.0000 ug | ORAL_TABLET | Freq: Every day | ORAL | 3 refills | Status: DC

## 2020-10-09 NOTE — Assessment & Plan Note (Signed)
Stable period off medication ,using natural supplement as well as exercise effectively.

## 2020-10-09 NOTE — Assessment & Plan Note (Addendum)
Chronic, has seen OT.

## 2020-10-09 NOTE — Assessment & Plan Note (Signed)
Preventative protocols reviewed and updated unless pt declined. Discussed healthy diet and lifestyle.  

## 2020-10-09 NOTE — Assessment & Plan Note (Deleted)
Stable period off medication ,using natural supplement as well as exercise effectively.

## 2020-10-09 NOTE — Patient Instructions (Addendum)
You are doing well today Continue thyroid medicine.  Continue healthy stress relieving strategies as up to now Return as needed or in 1 year for next physical.   Health Maintenance, Female Adopting a healthy lifestyle and getting preventive care are important in promoting health and wellness. Ask your health care provider about:  The right schedule for you to have regular tests and exams.  Things you can do on your own to prevent diseases and keep yourself healthy. What should I know about diet, weight, and exercise? Eat a healthy diet  Eat a diet that includes plenty of vegetables, fruits, low-fat dairy products, and lean protein.  Do not eat a lot of foods that are high in solid fats, added sugars, or sodium.   Maintain a healthy weight Body mass index (BMI) is used to identify weight problems. It estimates body fat based on height and weight. Your health care provider can help determine your BMI and help you achieve or maintain a healthy weight. Get regular exercise Get regular exercise. This is one of the most important things you can do for your health. Most adults should:  Exercise for at least 150 minutes each week. The exercise should increase your heart rate and make you sweat (moderate-intensity exercise).  Do strengthening exercises at least twice a week. This is in addition to the moderate-intensity exercise.  Spend less time sitting. Even light physical activity can be beneficial. Watch cholesterol and blood lipids Have your blood tested for lipids and cholesterol at 46 years of age, then have this test every 5 years. Have your cholesterol levels checked more often if:  Your lipid or cholesterol levels are high.  You are older than 46 years of age.  You are at high risk for heart disease. What should I know about cancer screening? Depending on your health history and family history, you may need to have cancer screening at various ages. This may include screening  for:  Breast cancer.  Cervical cancer.  Colorectal cancer.  Skin cancer.  Lung cancer. What should I know about heart disease, diabetes, and high blood pressure? Blood pressure and heart disease  High blood pressure causes heart disease and increases the risk of stroke. This is more likely to develop in people who have high blood pressure readings, are of African descent, or are overweight.  Have your blood pressure checked: ? Every 3-5 years if you are 60-72 years of age. ? Every year if you are 67 years old or older. Diabetes Have regular diabetes screenings. This checks your fasting blood sugar level. Have the screening done:  Once every three years after age 20 if you are at a normal weight and have a low risk for diabetes.  More often and at a younger age if you are overweight or have a high risk for diabetes. What should I know about preventing infection? Hepatitis B If you have a higher risk for hepatitis B, you should be screened for this virus. Talk with your health care provider to find out if you are at risk for hepatitis B infection. Hepatitis C Testing is recommended for:  Everyone born from [birth year] through 1966.  Anyone with known risk factors for hepatitis C. Sexually transmitted infections (STIs)  Get screened for STIs, including gonorrhea and chlamydia, if: ? You are sexually active and are younger than 46 years of age. ? You are older than 46 years of age and your health care provider tells you that you are at risk for  this type of infection. ? Your sexual activity has changed since you were last screened, and you are at increased risk for chlamydia or gonorrhea. Ask your health care provider if you are at risk.  Ask your health care provider about whether you are at high risk for HIV. Your health care provider may recommend a prescription medicine to help prevent HIV infection. If you choose to take medicine to prevent HIV, you should first get tested for HIV.  You should then be tested every 3 months for as long as you are taking the medicine. Pregnancy  If you are about to stop having your period (premenopausal) and you may become pregnant, seek counseling before you get pregnant.  Take 400 to 800 micrograms (mcg) of folic acid every day if you become pregnant.  Ask for birth control (contraception) if you want to prevent pregnancy. Osteoporosis and menopause Osteoporosis is a disease in which the bones lose minerals and strength with aging. This can result in bone fractures. If you are 14 years old or older, or if you are at risk for osteoporosis and fractures, ask your health care provider if you should:  Be screened for bone loss.  Take a calcium or vitamin D supplement to lower your risk of fractures.  Be given hormone replacement therapy (HRT) to treat symptoms of menopause. Follow these instructions at home: Lifestyle  Do not use any products that contain nicotine or tobacco, such as cigarettes, e-cigarettes, and chewing tobacco. If you need help quitting, ask your health care provider.  Do not use street drugs.  Do not share needles.  Ask your health care provider for help if you need support or information about quitting drugs. Alcohol use  Do not drink alcohol if: ? Your health care provider tells you not to drink. ? You are pregnant, may be pregnant, or are planning to become pregnant.  If you drink alcohol: ? Limit how much you use to 0-1 drink a day. ? Limit intake if you are breastfeeding.  Be aware of how much alcohol is in your drink. In the [place] one drink equals one 12 oz bottle of beer (355 mL), one 5 oz glass of wine (148 mL), or one 1 oz glass of hard liquor (44 mL). General instructions  Schedule regular health, dental, and eye exams.  Stay current with your vaccines.  Tell your health care provider if: ? You often feel depressed. ? You have ever been abused or do not feel safe at  home. Summary  Adopting a healthy lifestyle and getting preventive care are important in promoting health and wellness.  Follow your health care provider's instructions about healthy diet, exercising, and getting tested or screened for diseases.  Follow your health care provider's instructions on monitoring your cholesterol and blood pressure. This information is not intended to replace advice given to you by your health care provider. Make sure you discuss any questions you have with your health care provider. Document Revised: 06/24/2018 Document Reviewed: 06/24/2018 Elsevier Patient Education  2021 [employer].

## 2020-10-09 NOTE — Progress Notes (Addendum)
Patient ID: Gabrielle Dominguez, female    DOB: February 24, 1976, 45 y.o.   MRN: [id number]  This visit was conducted in person.  BP 118/78   Pulse 81   Temp 97.6 F (36.4 C) (Temporal)   Ht 5' 3.75" (1.619 m)   Wt 190 lb 9 oz (86.4 kg)   LMP 09/12/2011   SpO2 98%   BMI 32.97 kg/m    CC: CPE Subjective:   HPI: Gabrielle Dominguez is a 45 y.o. female presenting on 10/09/2020 for Annual Exam   ?Bipolar 2 - last visit we started abilify - didn't like how she felt so she stopped taking. Previously saw psychiatry. Previously did best on wellbutrin. Instead has been taking OTC plant based mood medicine (Borion calm stress) as well as exercise routine (Boot camp class and Gym).   Preventative: COLONOSCOPY 10/2012 - normal, mult random biopsies obtained, normal [facility])  Well woman - s/p hysterectomy 2015, ovaries remain. Sees OBGYN, last well woman 11/2017.  Mammogram 02/2020 Birads1 @ [place] [facility] Flu shot - yearly COVID vaccine Levan Hurst) - will let us know dates  Tdap 08/2018 Seat belt use discussed Sunscreen use discussed. No changing moles on skin. Non smoker Alcohol - socially 1-2 glasses of wine a few times a week  Dentist q6 mo Eye exam yearly, had lasik 02/2019   Caffeine: rarely Lives with fiance, son (2000), mother, 1 dog Occupation: Glass blower/designer for [employer] Edu: 12th grade Activity: tries to walk - has not been to gym recently - no time Diet: lots of water, good fruits and vegetables, red meat 3x/wk      Relevant past medical, surgical, family and social history reviewed and updated as indicated. Interim medical history since our last visit reviewed. Allergies and medications reviewed and updated. Outpatient Medications Prior to Visit  Medication Sig Dispense Refill  . valACYclovir (VALTREX) 500 MG tablet Take 1 tablet (500 mg total) by mouth 2 (two) times daily. For 3 days per course. (Patient taking differently: Take 500 mg by mouth 2 (two) times daily. For 3 days per  course.  As needed) 30 tablet 3  . EUTHYROX 88 MCG tablet TAKE 1 TABLET BY MOUTH ONCE DAILY BEFORE BREAKFAST 90 tablet 0  . ALPRAZolam (XANAX) 0.25 MG tablet Take 1 tablet (0.25 mg total) by mouth 2 (two) times daily as needed for anxiety. (Patient not taking: Reported on 10/08/2019) 30 tablet 0  . ARIPiprazole (ABILIFY) 5 MG tablet Take 1 tablet (5 mg total) by mouth daily. 30 tablet 11   No facility-administered medications prior to visit.     Per HPI unless specifically indicated in ROS section below Review of Systems  Constitutional: Negative for activity change, appetite change, chills, fatigue, fever and unexpected weight change.  HENT: Negative for hearing loss.   Eyes: Negative for visual disturbance.  Respiratory: Negative for cough, chest tightness, shortness of breath and wheezing.   Cardiovascular: Negative for chest pain, palpitations and leg swelling.  Gastrointestinal: Negative for abdominal distention, abdominal pain, blood in stool, constipation, diarrhea, nausea and vomiting.  Genitourinary: Negative for difficulty urinating and hematuria.  Musculoskeletal: Negative for arthralgias, myalgias and neck pain.  Skin: Negative for rash.  Neurological: Negative for dizziness, seizures, syncope and headaches.  Hematological: Negative for adenopathy. Does not bruise/bleed easily.  Psychiatric/Behavioral: Negative for dysphoric mood. The patient is not nervous/anxious.    Objective:  BP 118/78   Pulse 81   Temp 97.6 F (36.4 C) (Temporal)   Ht 5' 3.75" (  1.619 m)   Wt 190 lb 9 oz (86.4 kg)   LMP 09/12/2011   SpO2 98%   BMI 32.97 kg/m   Wt Readings from Last 3 Encounters:  10/09/20 190 lb 9 oz (86.4 kg)  10/08/19 193 lb 1 oz (87.6 kg)  08/27/19 194 lb (88 kg)      Physical Exam Vitals and nursing note reviewed.  Constitutional:      General: She is not in acute distress.    Appearance: Normal appearance. She is well-developed. She is not ill-appearing.  HENT:      Head: Normocephalic and atraumatic.     Right Ear: Hearing, tympanic membrane, ear canal and external ear normal.     Left Ear: Hearing, tympanic membrane, ear canal and external ear normal.  Eyes:     General: No scleral icterus.    Extraocular Movements: Extraocular movements intact.     Conjunctiva/sclera: Conjunctivae normal.     Pupils: Pupils are equal, round, and reactive to light.  Neck:     Thyroid: No thyroid mass or thyromegaly.  Cardiovascular:     Rate and Rhythm: Normal rate and regular rhythm.     Pulses: Normal pulses.          Radial pulses are 2+ on the right side and 2+ on the left side.     Heart sounds: Normal heart sounds. No murmur heard.   Pulmonary:     Effort: Pulmonary effort is normal. No respiratory distress.     Breath sounds: Normal breath sounds. No wheezing, rhonchi or rales.  Abdominal:     General: Bowel sounds are normal. There is no distension.     Palpations: Abdomen is soft. There is no mass.     Tenderness: There is no abdominal tenderness. There is no guarding or rebound.     Hernia: No hernia is present.  Musculoskeletal:        General: Normal range of motion.     Cervical back: Normal range of motion and neck supple.     Right lower leg: No edema.     Left lower leg: No edema.  Lymphadenopathy:     Cervical: No cervical adenopathy.  Skin:    General: Skin is warm and dry.     Findings: No rash.  Neurological:     General: No focal deficit present.     Mental Status: She is alert and oriented to person, place, and time.     Comments: CN grossly intact, station and gait intact  Psychiatric:        Mood and Affect: Mood normal.        Behavior: Behavior normal.        Thought Content: Thought content normal.        Judgment: Judgment normal.       Results for orders placed or performed in visit on 10/03/20  T4, free  Result Value Ref Range   Free T4 1.47 0.60 - 1.60 ng/dL  TSH  Result Value Ref Range   TSH 1.79 0.35 - 4.50  uIU/mL  Comprehensive metabolic panel  Result Value Ref Range   Sodium 139 135 - 145 mEq/L   Potassium 4.1 3.5 - 5.1 mEq/L   Chloride 104 96 - 112 mEq/L   CO2 28 19 - 32 mEq/L   Glucose, Bld 98 70 - 99 mg/dL   BUN 11 6 - 23 mg/dL   Creatinine, Ser 0.84 0.40 - 1.20 mg/dL   Total Bilirubin 0.5  0.2 - 1.2 mg/dL   Alkaline Phosphatase 58 39 - 117 U/L   AST 12 0 - 37 U/L   ALT 13 0 - 35 U/L   Total Protein 6.3 6.0 - 8.3 g/dL   Albumin 4.0 3.5 - 5.2 g/dL   GFR 83.87 >60.00 mL/min   Calcium 9.0 8.4 - 10.5 mg/dL  Lipid panel  Result Value Ref Range   Cholesterol 158 0 - 200 mg/dL   Triglycerides 118.0 0.0 - 149.0 mg/dL   HDL 57.90 >39.00 mg/dL   VLDL 23.6 0.0 - 40.0 mg/dL   LDL Cholesterol 76 0 - 99 mg/dL   Total CHOL/HDL Ratio 3    NonHDL 99.70    Depression screen [facility] 2/9 10/09/2020 10/09/2020 10/08/2019 08/27/2019 11/13/2018  Decreased Interest 0 0 1 1 0  Down, Depressed, Hopeless 0 0 1 1 1   PHQ - 2 Score 0 0 2 2 1   Altered sleeping 0 - 1 1 2   Tired, decreased energy 1 - 2 2 2   Change in appetite 0 - 1 1 2   Feeling bad or failure about yourself  0 - 0 1 0  Trouble concentrating 0 - 0 1 1  Moving slowly or fidgety/restless 0 - 1 0 0  Suicidal thoughts 0 - 0 0 0  PHQ-9 Score 1 - 7 8 8     GAD 7 : Generalized Anxiety Score 10/09/2020 10/08/2019 08/27/2019 11/13/2018  Nervous, Anxious, on Edge 1 1 1 2   Control/stop worrying 1 1 1  0  Worry too much - different things 1 1 2 3   Trouble relaxing 0 1 2 2   Restless 0 1 2 2   Easily annoyed or irritable 0 1 1 1   Afraid - awful might happen 0 0 0 1  Total GAD 7 Score 3 6 9 11    Assessment & Plan:  This visit occurred during the SARS-CoV-2 public health emergency.  Safety protocols were in place, including screening questions prior to the visit, additional usage of staff PPE, and extensive cleaning of exam room while observing appropriate contact time as indicated for disinfecting solutions.   Problem List Items Addressed This Visit     Hypothyroidism    Chronic, stable on current regimen without significant hyper or hypothyroid symptoms - continue.       Relevant Medications   levothyroxine (EUTHYROX) 88 MCG tablet   Obesity, Class I, BMI 30.0-34.9 (see actual BMI)    Encouraged healthy diet and lifestyle choices to affect sustainable weight loss.       Lymphedema of left lower extremity    Chronic, has seen OT.       Bipolar 2 disorder ([facility])    Stable period off medication ,using natural supplement as well as exercise effectively.      Anxiety state    Stable period off medication ,using natural supplement as well as exercise effectively.      Health maintenance examination - Primary    Preventative protocols reviewed and updated unless pt declined. Discussed healthy diet and lifestyle.           Meds ordered this encounter  Medications  . levothyroxine (EUTHYROX) 88 MCG tablet    Sig: Take 1 tablet (88 mcg total) by mouth daily before breakfast.    Dispense:  90 tablet    Refill:  3   No orders of the defined types were placed in this encounter.   Patient instructions: You are doing well today Continue thyroid medicine.  Continue healthy stress relieving  strategies as up to now Return as needed or in 1 year for next physical.   Follow up plan: Return in about 1 year (around 10/09/2021) for annual exam, prior fasting for blood work.  Ria Bush, MD

## 2020-10-09 NOTE — Assessment & Plan Note (Signed)
Chronic, stable on current regimen without significant hyper or hypothyroid symptoms - continue.

## 2020-10-09 NOTE — Assessment & Plan Note (Signed)
Encouraged healthy diet and lifestyle choices to affect sustainable weight loss.  ?

## 2020-10-26 ENCOUNTER — Ambulatory Visit
Admission: RE | Admit: 2020-10-26 | Discharge: 2020-10-26 | Disposition: A | Discharge status: Home / Self Care | Payer: 59 | Source: Ambulatory Visit | Attending: Emergency Medicine | Admitting: Emergency Medicine

## 2020-10-26 ENCOUNTER — Other Ambulatory Visit: Payer: Self-pay

## 2020-10-26 VITALS — BP 141/90 | HR 72 | Temp 98.6°F | Resp 18 | Ht 64.0 in | Wt 176.0 lb

## 2020-10-26 DIAGNOSIS — J029 Acute pharyngitis, unspecified: Secondary | ICD-10-CM | POA: Diagnosis not present

## 2020-10-26 LAB — GROUP A STREP BY PCR: Group A Strep by PCR: NOT DETECTED

## 2020-10-26 MED ORDER — PREDNISONE 10 MG (21) PO TBPK: ORAL_TABLET | Freq: Every day | ORAL | 0 refills | Status: DC

## 2020-10-26 MED ORDER — TRAMADOL HCL 50 MG PO TABS: 100.0000 mg | ORAL_TABLET | Freq: Four times a day (QID) | ORAL | 0 refills | Status: DC | PRN

## 2020-10-26 MED ORDER — KETOROLAC TROMETHAMINE 60 MG/2ML IM SOLN
30.0000 mg | Freq: Once | INTRAMUSCULAR | Status: AC
  Administered 2020-10-26: 30 mg via INTRAMUSCULAR

## 2020-10-26 NOTE — Discharge Instructions (Addendum)
Your strep test was negative.  You sore throat may be caused by a virus and should resolve on its own.  Starting tomorrow morning take the prednisone according to the package insert to help with swelling and with your sore throat pain.  You can use over-the-counter Tylenol and ibuprofen as needed for mild to moderate pain.  Use the tramadol as needed for severe pain.  If you have any worsening sore throat pain, you are unable to swallow your saliva, you feel that your throat is tightening up, or you are unable to breathe you need to go to the ER for evaluation.

## 2020-10-26 NOTE — ED Triage Notes (Signed)
Patient states she has a severe sore throat that started this morning. She states that pain is on her left side of her throat.

## 2020-10-26 NOTE — ED Provider Notes (Signed)
[facility]    CSN: [id number] Arrival date & time: 10/26/20  1843      History   Chief Complaint Chief Complaint  Patient presents with  . Sore Throat    HPI Gabrielle Dominguez is a 45 y.o. Dominguez.   HPI   45 year old Dominguez here for evaluation of sore throat.  Patient reports that her sore throat started Gabrielle suddenly this morning and is located mainly on the left side.  Throughout the day she has continued to have worsening of her pain and reports that it is Gabrielle painful to swallow.  Patient states she has had a mild runny nose but no postnasal drip, no sinus pressure, no ear pressure, fever, cough, shortness of breath, or wheezing.  Past Medical History:  Diagnosis Date  . Abnormal drug screen 02/2015   inapprop pos oxycodone (02/2015)  . Abnormal Pap smear   . Bipolar 2 disorder ([place])   . History of chicken pox   . History of endometriosis   . History of pneumonia 07/2010, 04/2011  . Hypothyroidism   . IBS (irritable bowel syndrome)   . Panic disorder   . Worsening headaches     Patient Active Problem List   Diagnosis Date Noted  . Health maintenance examination 08/27/2019  . Anxiety state 11/13/2018  . Adjustment disorder with depressed mood 03/24/2017  . S/P laparoscopic assisted vaginal hysterectomy (LAVH) 05/10/2014  . Bipolar 2 disorder ([place]) 04/15/2014  . Lymphedema of left lower extremity 03/29/2013  . Obesity, Class I, BMI 30.0-34.9 (see actual BMI) 08/20/2012  . Panic disorder 10/10/2011  . Hypothyroidism   . History of endometriosis 03/26/2011  . HEMATURIA UNSPECIFIED 02/04/2010    Past Surgical History:  Procedure Laterality Date  . BILATERAL SALPINGECTOMY N/A 05/10/2014   Procedure: BILATERAL SALPINGECTOMY;  Surgeon: Donnamae Jude, MD;  Location: [facility];  Service: Gynecology;  Laterality: N/A;  . CESAREAN SECTION  2000  . COLONOSCOPY  10/2012   normal, mult random biopsies obtained, normal Fuller Plan)  . LAPAROSCOPIC ASSISTED VAGINAL  HYSTERECTOMY N/A 05/10/2014   Procedure: LAPAROSCOPIC ASSISTED VAGINAL HYSTERECTOMY;  Surgeon: Donnamae Jude, MD;  Location: [facility];  Service: Gynecology;  Laterality: N/A;  . LAPAROSCOPIC ENDOMETRIOSIS FULGURATION  2002 and 2009    OB History    Gravida  2   Para  1   Term  1   Preterm      AB  1   Living  1     SAB  1   IAB      Ectopic      Multiple      Live Births  1            Home Medications    Prior to Admission medications   Medication Sig Start Date End Date Taking? Authorizing Provider  predniSONE (STERAPRED UNI-PAK 21 TAB) 10 MG (21) TBPK tablet Take by mouth daily. Take 6 tabs by mouth daily  for 2 days, then 5 tabs for 2 days, then 4 tabs for 2 days, then 3 tabs for 2 days, 2 tabs for 2 days, then 1 tab by mouth daily for 2 days 10/26/20  Yes Margarette Canada, NP  traMADol (ULTRAM) 50 MG tablet Take 2 tablets (100 mg total) by mouth every 6 (six) hours as needed. 10/26/20  Yes Margarette Canada, NP  levothyroxine (EUTHYROX) 88 MCG tablet Take 1 tablet (88 mcg total) by mouth daily before breakfast. 10/09/20   Ria Bush, MD  Family History Family History  Problem Relation Age of Onset  . Heart attack Paternal Grandfather   . Hypertension Paternal Grandfather   . Hypertension Maternal Grandmother   . Hypertension Father   . Melanoma Father   . Cancer Mother        vulvar  . Hypertension Mother   . Stroke Mother   . Irritable bowel syndrome Mother   . Depression Mother   . Colon cancer Maternal Aunt 76  . Hypertension Brother   . Stroke Maternal Grandfather   . Melanoma Cousin   . Diabetes Neg Hx   . Breast cancer Neg Hx     Social History Social History   Tobacco Use  . Smoking status: Former Smoker    Types: Cigarettes    Quit date: 07/15/2002    Years since quitting: 18.2  . Smokeless tobacco: Never Used  Vaping Use  . Vaping Use: Never used  Substance Use Topics  . Alcohol use: Yes    Alcohol/week: 0.0 standard drinks     Comment: rarely- social  . Drug use: No     Allergies   Penicillins   Review of Systems Review of Systems  Constitutional: Negative for activity change, appetite change and fever.  HENT: Positive for sore throat. Negative for congestion and ear pain.   Respiratory: Negative for cough and shortness of breath.   Skin: Negative for rash.  Neurological: Negative for headaches.  Hematological: Negative.   Psychiatric/Behavioral: Negative.      Physical Exam Triage Vital Signs ED Triage Vitals  Enc Vitals Group     BP 10/26/20 1904 (!) 141/90     Pulse Rate 10/26/20 1904 72     Resp 10/26/20 1904 18     Temp 10/26/20 1904 98.6 F (37 C)     Temp Source 10/26/20 1904 Oral     SpO2 10/26/20 1904 98 %     Weight 10/26/20 1903 176 lb (79.8 kg)     Height 10/26/20 1903 5\' 4"  (1.626 m)     Head Circumference --      Peak Flow --      Pain Score 10/26/20 1903 10     Pain Loc --      Pain Edu? --      Excl. in [place]? --    No data found.  Updated Vital Signs BP (!) 141/90 (BP Location: Right Arm)   Pulse 72   Temp 98.6 F (37 C) (Oral)   Resp 18   Ht 5\' 4"  (1.626 m)   Wt 176 lb (79.8 kg)   LMP 09/12/2011   SpO2 98%   BMI 30.21 kg/m   Visual Acuity Right Eye Distance:   Left Eye Distance:   Bilateral Distance:    Right Eye Near:   Left Eye Near:    Bilateral Near:     Physical Exam Vitals and nursing note reviewed.  Constitutional:      General: She is not in acute distress.    Appearance: She is well-developed. She is not ill-appearing.  HENT:     Head: Normocephalic and atraumatic.     Right Ear: Tympanic membrane and ear canal normal. Tympanic membrane is not erythematous.     Left Ear: Tympanic membrane and ear canal normal. Tympanic membrane is not erythematous.     Mouth/Throat:     Mouth: Mucous membranes are moist.     Pharynx: Oropharynx is clear. Posterior oropharyngeal erythema present.     Tonsils: No  tonsillar exudate. 2+ on the right. 2+ on the  left.  Cardiovascular:     Rate and Rhythm: Normal rate and regular rhythm.     Heart sounds: Normal heart sounds. No murmur heard. No gallop.   Pulmonary:     Effort: Pulmonary effort is normal.     Breath sounds: Normal breath sounds. No wheezing, rhonchi or rales.  Musculoskeletal:     Cervical back: Normal range of motion and neck supple.  Lymphadenopathy:     Cervical: No cervical adenopathy.  Skin:    General: Skin is warm and dry.     Capillary Refill: Capillary refill takes less than 2 seconds.  Neurological:     General: No focal deficit present.     Mental Status: She is alert and oriented to person, place, and time.  Psychiatric:        Mood and Affect: Mood normal.        Behavior: Behavior normal.      UC Treatments / Results  Labs (all labs ordered are listed, but only abnormal results are displayed) Labs Reviewed  GROUP A STREP BY PCR    EKG   Radiology No results found.  Procedures Procedures (including critical care time)  Medications Ordered in UC Medications  ketorolac (TORADOL) injection 30 mg (30 mg Intramuscular Given 10/26/20 1924)    Initial Impression / Assessment and Plan / UC Course  I have reviewed the triage vital signs and the nursing notes.  Pertinent labs & imaging results that were available during my care of the patient were reviewed by me and considered in my medical decision making (see chart for details).   Patient is a Gabrielle pleasant 45 year old Dominguez here for evaluation of left-sided sore throat pain that started this morning.  Patient reports that she is only experiencing pain on the left side of her throat but she denies fever, or other upper respiratory symptoms.  Patient states that it hurts to swallow her saliva but she is able to manage her saliva.  Patient is also able to open her mouth.  Physical exam reveals bilateral erythematous tonsillar pillars without exudate.  No cervical lymphadenopathy appreciated on exam.   Patient states she does have some tenderness when palpating underneath the mandible in the submental area but there is no fullness.  There is no bogginess when palpating the floor of her mouth.  No inflammation of her parotid glands evident on exam.  Cardiopulmonary exam is benign.  Will swab patient for strep and medicated for pain with IM Toradol.  Strep PCR is negative.  Will treat patient for acute pharyngitis with prednisone to decrease the swelling and tramadol for pain.  ER precautions reviewed with patient.   Final Clinical Impressions(s) / UC Diagnoses   Final diagnoses:  Pharyngitis, unspecified etiology     Discharge Instructions     Your strep test was negative.  You sore throat may be caused by a virus and should resolve on its own.  Starting tomorrow morning take the prednisone according to the package insert to help with swelling and with your sore throat pain.  You can use over-the-counter Tylenol and ibuprofen as needed for mild to moderate pain.  Use the tramadol as needed for severe pain.  If you have any worsening sore throat pain, you are unable to swallow your saliva, you feel that your throat is tightening up, or you are unable to breathe you need to go to the ER for evaluation.  ED Prescriptions    Medication Sig Dispense Auth. Provider   predniSONE (STERAPRED UNI-PAK 21 TAB) 10 MG (21) TBPK tablet Take by mouth daily. Take 6 tabs by mouth daily  for 2 days, then 5 tabs for 2 days, then 4 tabs for 2 days, then 3 tabs for 2 days, 2 tabs for 2 days, then 1 tab by mouth daily for 2 days 42 tablet Margarette Canada, NP   traMADol (ULTRAM) 50 MG tablet Take 2 tablets (100 mg total) by mouth every 6 (six) hours as needed. 15 tablet Margarette Canada, NP     I have reviewed the PDMP during this encounter.   Margarette Canada, NP 10/26/20 1958

## 2020-10-28 ENCOUNTER — Other Ambulatory Visit: Payer: Self-pay

## 2020-10-28 ENCOUNTER — Emergency Department: Payer: 59

## 2020-10-28 ENCOUNTER — Encounter: Payer: Self-pay | Admitting: Emergency Medicine

## 2020-10-28 ENCOUNTER — Inpatient Hospital Stay
Admission: EM | Admit: 2020-10-28 | Discharge: 2020-11-01 | DRG: 872 | Disposition: A | Discharge status: Home / Self Care | Payer: 59 | Attending: Internal Medicine | Admitting: Internal Medicine

## 2020-10-28 DIAGNOSIS — Z87891 Personal history of nicotine dependence: Secondary | ICD-10-CM | POA: Diagnosis not present

## 2020-10-28 DIAGNOSIS — Z823 Family history of stroke: Secondary | ICD-10-CM | POA: Diagnosis not present

## 2020-10-28 DIAGNOSIS — J0430 Supraglottitis, unspecified, without obstruction: Secondary | ICD-10-CM | POA: Diagnosis present

## 2020-10-28 DIAGNOSIS — E039 Hypothyroidism, unspecified: Secondary | ICD-10-CM | POA: Diagnosis present

## 2020-10-28 DIAGNOSIS — E872 Acidosis: Secondary | ICD-10-CM | POA: Diagnosis present

## 2020-10-28 DIAGNOSIS — R6520 Severe sepsis without septic shock: Secondary | ICD-10-CM | POA: Diagnosis present

## 2020-10-28 DIAGNOSIS — J038 Acute tonsillitis due to other specified organisms: Secondary | ICD-10-CM

## 2020-10-28 DIAGNOSIS — F3181 Bipolar II disorder: Secondary | ICD-10-CM | POA: Diagnosis present

## 2020-10-28 DIAGNOSIS — Z8701 Personal history of pneumonia (recurrent): Secondary | ICD-10-CM

## 2020-10-28 DIAGNOSIS — Z8249 Family history of ischemic heart disease and other diseases of the circulatory system: Secondary | ICD-10-CM

## 2020-10-28 DIAGNOSIS — J36 Peritonsillar abscess: Secondary | ICD-10-CM

## 2020-10-28 DIAGNOSIS — Z808 Family history of malignant neoplasm of other organs or systems: Secondary | ICD-10-CM

## 2020-10-28 DIAGNOSIS — A419 Sepsis, unspecified organism: Principal | ICD-10-CM | POA: Diagnosis present

## 2020-10-28 DIAGNOSIS — B9689 Other specified bacterial agents as the cause of diseases classified elsewhere: Secondary | ICD-10-CM | POA: Diagnosis not present

## 2020-10-28 DIAGNOSIS — Z880 Allergy status to penicillin: Secondary | ICD-10-CM

## 2020-10-28 DIAGNOSIS — E876 Hypokalemia: Secondary | ICD-10-CM | POA: Diagnosis present

## 2020-10-28 DIAGNOSIS — J029 Acute pharyngitis, unspecified: Secondary | ICD-10-CM | POA: Diagnosis not present

## 2020-10-28 DIAGNOSIS — Z20822 Contact with and (suspected) exposure to covid-19: Secondary | ICD-10-CM | POA: Diagnosis present

## 2020-10-28 DIAGNOSIS — J039 Acute tonsillitis, unspecified: Secondary | ICD-10-CM

## 2020-10-28 HISTORY — DX: Peritonsillar abscess: J36

## 2020-10-28 LAB — COMPREHENSIVE METABOLIC PANEL
ALT: 15 U/L (ref 0–44)
AST: 25 U/L (ref 15–41)
Albumin: 4 g/dL (ref 3.5–5.0)
Alkaline Phosphatase: 58 U/L (ref 38–126)
Anion gap: 16 — ABNORMAL HIGH (ref 5–15)
BUN: 10 mg/dL (ref 6–20)
CO2: 19 mmol/L — ABNORMAL LOW (ref 22–32)
Calcium: 8.7 mg/dL — ABNORMAL LOW (ref 8.9–10.3)
Chloride: 101 mmol/L (ref 98–111)
Creatinine, Ser: 0.91 mg/dL (ref 0.44–1.00)
GFR, Estimated: >60 mL/min (ref >60)
Glucose, Bld: 239 mg/dL — ABNORMAL HIGH (ref 70–99)
Potassium: 2.9 mmol/L — ABNORMAL LOW (ref 3.5–5.1)
Sodium: 136 mmol/L (ref 135–145)
Total Bilirubin: 0.9 mg/dL (ref 0.3–1.2)
Total Protein: 7.3 g/dL (ref 6.5–8.1)

## 2020-10-28 LAB — CBC WITH DIFFERENTIAL/PLATELET
Abs Immature Granulocytes: 0.18 10*3/uL — ABNORMAL HIGH (ref 0.00–0.07)
Basophils Absolute: 0.1 10*3/uL (ref 0.0–0.1)
Basophils Relative: 0 %
Eosinophils Absolute: 0 10*3/uL (ref 0.0–0.5)
Eosinophils Relative: 0 %
HCT: 44 % (ref 36.0–46.0)
Hemoglobin: 16.2 g/dL — ABNORMAL HIGH (ref 12.0–15.0)
Immature Granulocytes: 1 %
Lymphocytes Relative: 10 %
Lymphs Abs: 3 10*3/uL (ref 0.7–4.0)
MCH: 34 pg (ref 26.0–34.0)
MCHC: 36.8 g/dL — ABNORMAL HIGH (ref 30.0–36.0)
MCV: 92.4 fL (ref 80.0–100.0)
Monocytes Absolute: 1.1 10*3/uL — ABNORMAL HIGH (ref 0.1–1.0)
Monocytes Relative: 4 %
Neutro Abs: 25.2 10*3/uL — ABNORMAL HIGH (ref 1.7–7.7)
Neutrophils Relative %: 85 %
Platelets: 417 10*3/uL — ABNORMAL HIGH (ref 150–400)
RBC: 4.76 MIL/uL (ref 3.87–5.11)
RDW: 11.7 % (ref 11.5–15.5)
Smear Review: NORMAL
WBC: 29.5 10*3/uL — ABNORMAL HIGH (ref 4.0–10.5)
nRBC: 0 % (ref 0.0–0.2)

## 2020-10-28 LAB — MAGNESIUM: Magnesium: 2.1 mg/dL (ref 1.7–2.4)

## 2020-10-28 LAB — LACTIC ACID, PLASMA
Lactic Acid, Venous: 5.6 mmol/L (ref 0.5–1.9)
Lactic Acid, Venous: 4.2 mmol/L (ref 0.5–1.9)
Lactic Acid, Venous: 1.6 mmol/L (ref 0.5–1.9)
Lactic Acid, Venous: 1.2 mmol/L (ref 0.5–1.9)

## 2020-10-28 LAB — PROTIME-INR
INR: 1 (ref 0.8–1.2)
Prothrombin Time: 13.4 seconds (ref 11.4–15.2)

## 2020-10-28 LAB — T4, FREE: Free T4: 1.72 ng/dL — ABNORMAL HIGH (ref 0.61–1.12)

## 2020-10-28 LAB — HCG, QUANTITATIVE, PREGNANCY: hCG, Beta Chain, Quant, S: 1 m[IU]/mL (ref <5)

## 2020-10-28 LAB — RESP PANEL BY RT-PCR (FLU A&B, COVID) ARPGX2
Influenza A by PCR: NEGATIVE
Influenza B by PCR: NEGATIVE
SARS Coronavirus 2 by RT PCR: NEGATIVE

## 2020-10-28 LAB — PROCALCITONIN: Procalcitonin: <0.1 ng/mL

## 2020-10-28 LAB — GROUP A STREP BY PCR: Group A Strep by PCR: NOT DETECTED

## 2020-10-28 LAB — TSH: TSH: 1.3 u[IU]/mL (ref 0.350–4.500)

## 2020-10-28 LAB — HIV ANTIBODY (ROUTINE TESTING W REFLEX): HIV Screen 4th Generation wRfx: NONREACTIVE

## 2020-10-28 LAB — APTT: aPTT: 24 seconds (ref 24–36)

## 2020-10-28 MED ORDER — POTASSIUM CHLORIDE CRYS ER 20 MEQ PO TBCR
40.0000 meq | EXTENDED_RELEASE_TABLET | Freq: Once | ORAL | Status: DC
  Filled 2020-10-28: qty 2

## 2020-10-28 MED ORDER — DEXAMETHASONE SODIUM PHOSPHATE 10 MG/ML IJ SOLN
10.0000 mg | Freq: Three times a day (TID) | INTRAMUSCULAR | Status: AC
Start: 2020-10-28 — End: 2020-10-29
  Administered 2020-10-28 – 2020-10-29 (×3): 10 mg via INTRAVENOUS
  Filled 2020-10-28 (×3): qty 1

## 2020-10-28 MED ORDER — LACTATED RINGERS IV SOLN: INTRAVENOUS | Status: DC

## 2020-10-28 MED ORDER — CLINDAMYCIN PHOSPHATE 600 MG/50ML IV SOLN
600.0000 mg | Freq: Three times a day (TID) | INTRAVENOUS | Status: DC
  Administered 2020-10-28 – 2020-10-30 (×5): 600 mg via INTRAVENOUS
  Filled 2020-10-28 (×10): qty 50

## 2020-10-28 MED ORDER — LIDOCAINE VISCOUS HCL 2 % MT SOLN
15.0000 mL | Freq: Once | OROMUCOSAL | Status: AC
  Administered 2020-10-28: 15 mL via OROMUCOSAL
  Filled 2020-10-28: qty 15

## 2020-10-28 MED ORDER — VANCOMYCIN HCL IN DEXTROSE 1-5 GM/200ML-% IV SOLN
1000.0000 mg | Freq: Once | INTRAVENOUS | Status: AC
  Administered 2020-10-28: 1000 mg via INTRAVENOUS
  Filled 2020-10-28: qty 200

## 2020-10-28 MED ORDER — ENOXAPARIN SODIUM 300 MG/3ML IJ SOLN
0.5000 mg/kg | INTRAMUSCULAR | Status: DC
  Filled 2020-10-28: qty 0.4

## 2020-10-28 MED ORDER — ONDANSETRON HCL 4 MG/2ML IJ SOLN: 4.0000 mg | Freq: Four times a day (QID) | INTRAMUSCULAR | Status: DC | PRN

## 2020-10-28 MED ORDER — MORPHINE SULFATE (PF) 4 MG/ML IV SOLN
4.0000 mg | Freq: Once | INTRAVENOUS | Status: AC
  Administered 2020-10-28: 4 mg via INTRAVENOUS
  Filled 2020-10-28: qty 1

## 2020-10-28 MED ORDER — ENOXAPARIN SODIUM 40 MG/0.4ML ~~LOC~~ SOLN
40.0000 mg | SUBCUTANEOUS | Status: DC
  Administered 2020-10-28 – 2020-10-31 (×4): 40 mg via SUBCUTANEOUS
  Filled 2020-10-28 (×4): qty 0.4

## 2020-10-28 MED ORDER — VANCOMYCIN HCL 1250 MG/250ML IV SOLN
1250.0000 mg | INTRAVENOUS | Status: DC
  Administered 2020-10-29 – 2020-10-30 (×2): 1250 mg via INTRAVENOUS
  Filled 2020-10-28 (×2): qty 250

## 2020-10-28 MED ORDER — VANCOMYCIN HCL 500 MG/100ML IV SOLN
500.0000 mg | Freq: Once | INTRAVENOUS | Status: AC
  Administered 2020-10-28: 500 mg via INTRAVENOUS
  Filled 2020-10-28: qty 100

## 2020-10-28 MED ORDER — CLINDAMYCIN PHOSPHATE 600 MG/50ML IV SOLN
600.0000 mg | Freq: Once | INTRAVENOUS | Status: AC
  Administered 2020-10-28: 600 mg via INTRAVENOUS
  Filled 2020-10-28 (×2): qty 50

## 2020-10-28 MED ORDER — IOHEXOL 300 MG/ML  SOLN
75.0000 mL | Freq: Once | INTRAMUSCULAR | Status: AC | PRN
  Administered 2020-10-28: 75 mL via INTRAVENOUS

## 2020-10-28 MED ORDER — LACTATED RINGERS IV BOLUS
1000.0000 mL | Freq: Once | INTRAVENOUS | Status: AC
  Administered 2020-10-28: 1000 mL via INTRAVENOUS

## 2020-10-28 MED ORDER — ONDANSETRON HCL 4 MG PO TABS: 4.0000 mg | ORAL_TABLET | Freq: Four times a day (QID) | ORAL | Status: DC | PRN

## 2020-10-28 MED ORDER — LEVOTHYROXINE SODIUM 88 MCG PO TABS
88.0000 ug | ORAL_TABLET | Freq: Every day | ORAL | Status: DC
  Administered 2020-10-29 – 2020-11-01 (×4): 88 ug via ORAL
  Filled 2020-10-28 (×5): qty 1

## 2020-10-28 MED ORDER — LACTATED RINGERS IV BOLUS: 1000.0000 mL | Freq: Once | INTRAVENOUS | Status: DC

## 2020-10-28 MED ORDER — LEVOFLOXACIN IN D5W 750 MG/150ML IV SOLN
750.0000 mg | Freq: Once | INTRAVENOUS | Status: AC
  Administered 2020-10-28: 750 mg via INTRAVENOUS
  Filled 2020-10-28: qty 150

## 2020-10-28 MED ORDER — DIPHENHYDRAMINE HCL 25 MG PO CAPS
25.0000 mg | ORAL_CAPSULE | Freq: Every evening | ORAL | Status: DC | PRN
  Administered 2020-10-28 – 2020-10-30 (×2): 25 mg via ORAL
  Filled 2020-10-28 (×3): qty 1

## 2020-10-28 MED ORDER — POTASSIUM CHLORIDE 2 MEQ/ML IV SOLN
INTRAVENOUS | Status: DC
  Filled 2020-10-28 (×9): qty 1000

## 2020-10-28 MED ORDER — CHLORHEXIDINE GLUCONATE CLOTH 2 % EX PADS
6.0000 | MEDICATED_PAD | Freq: Every day | CUTANEOUS | Status: DC
  Administered 2020-10-30 – 2020-10-31 (×2): 6 via TOPICAL
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
## 2020-10-28 NOTE — Progress Notes (Signed)
elink monitoring for sepsis proto

## 2020-10-28 NOTE — H&P (Signed)
History and Physical    Gabrielle Dominguez:[id number] DOB: 06-30-1976 DOA: 10/28/2020  PCP: Ria Bush, MD   Patient coming from: Home  I have personally briefly reviewed patient's old medical records in [place]  Chief Complaint: Left-sided neck/pain with swallowing  HPI: Gabrielle Dominguez is a 45 y.o. female with medical history significant for hypothyroidism, endometriosis and bipolar disorder who presents to the ER via EMS for evaluation of sudden onset of difficulty breathing, sore throat and left-sided neck pain. Her symptoms started about 2 days prior to admission and she was seen in an urgent care center where she was tested for strep and given a dose of Toradol.  She was discharged on prednisone and tramadol without any improvement in her symptoms. On the morning of her admission she woke up with complaints of difficulty breathing and a sensation that her throat was closing.  She called EMS and they were concerned about possible anaphylaxis patient received 2 doses of 0.3 mg IM epi, 50 mg of Benadryl, 125 mg of Solu-Medrol, 20 mg of famotidine, magnesium 2 g, 500 cc normal saline and albuterol. She denies having any fever or chills, no chest pain, no dizziness, no lightheadedness, no drooling, no abdominal pain, no changes in her bowel habits, no urinary symptoms, no headache, no blurred vision, no focal deficits, no cough. Labs show sodium 136, potassium 2.9, chloride 101, bicarb 19, glucose 239, BUN 10, creatinine 0.91, calcium 8.7, alkaline phosphatase 58, albumin 4.0, AST 25, ALT 15, total protein 7.3, lactic acid 5.6 >> 4.2, white count 29.5, hemoglobin 16.2, hematocrit 44, MCV 92.4, RDW 11.7, platelet count 417, PT 13.4, INR 1.0 Group A strep PCR negative Respiratory viral panel is negative Chest x-ray reviewed by me shows no acute findings CT scan of the neck with contrast shows  prominence of the palatine tonsils bilaterally. Additionally, there is  mucosal/submucosal edema of the pharynx, epiglottis and supraglottic larynx. Findings are compatible with pharyngitis/tonsillitis and supraglottitis in the appropriate clinical setting. Additionally, there is a low-density focus with subtle peripheral enhancement measuring 1.5 x 1.4 cm in transaxial dimensions in the region of the left glossotonsillar, suspicious for small tonsillar/peritonsillar abscess. Mild to moderate airway effacement at the level of the supraglottic larynx. Twelve-lead EKG reviewed by me shows sinus tachycardia    ED Course: Patient is a 45 year old female who presents to the ER via EMS for evaluation of difficulty breathing and feeling like her throat was closing up.  She also has pain in the left side of her neck.  She was initially treated for possible anaphylaxis by EMS but in the ER had a CT scan of the neck which showed findings compatible with pharyngitis/tonsillitis and supraglottitis with associated peritonsillar abscess.  Patient was seen in consultation by ENT who recommends IV antibiotics as well as 3 doses of IV Decadron.  No indication for drainage at this time.  Patient is septic and received broad-spectrum antibiotic therapy in the emergency room.  She will be admitted to the hospital for further evaluation and treatment.    Review of Systems: As per HPI otherwise all other systems reviewed and negative.    Past Medical History:  Diagnosis Date  . Abnormal drug screen 02/2015   inapprop pos oxycodone (02/2015)  . Abnormal Pap smear   . Bipolar 2 disorder ([place])   . History of chicken pox   . History of endometriosis   . History of pneumonia 07/2010, 04/2011  . Hypothyroidism   . IBS (  irritable bowel syndrome)   . Panic disorder   . Worsening headaches     Past Surgical History:  Procedure Laterality Date  . ABDOMINAL HYSTERECTOMY    . BILATERAL SALPINGECTOMY N/A 05/10/2014   Procedure: BILATERAL SALPINGECTOMY;  Surgeon: Donnamae Jude, MD;   Location: [facility];  Service: Gynecology;  Laterality: N/A;  . CESAREAN SECTION  2000  . COLONOSCOPY  10/2012   normal, mult random biopsies obtained, normal [place])  . LAPAROSCOPIC ASSISTED VAGINAL HYSTERECTOMY N/A 05/10/2014   Procedure: LAPAROSCOPIC ASSISTED VAGINAL HYSTERECTOMY;  Surgeon: Donnamae Jude, MD;  Location: [facility];  Service: Gynecology;  Laterality: N/A;  . LAPAROSCOPIC ENDOMETRIOSIS FULGURATION  2002 and 2009     reports that she quit smoking about 18 years ago. Her smoking use included cigarettes. She has never used smokeless tobacco. She reports current alcohol use. She reports that she does not use drugs.  Allergies  Allergen Reactions  . Penicillins Hives and Swelling    But can take amoxicillin??    Family History  Problem Relation Age of Onset  . Heart attack Paternal Grandfather   . Hypertension Paternal Grandfather   . Hypertension Maternal Grandmother   . Hypertension Father   . Melanoma Father   . Cancer Mother        vulvar  . Hypertension Mother   . Stroke Mother   . Irritable bowel syndrome Mother   . Depression Mother   . Colon cancer Maternal Aunt 22  . Hypertension Brother   . Stroke Maternal Grandfather   . Melanoma Cousin   . Diabetes Neg Hx   . Breast cancer Neg Hx       Prior to Admission medications   Medication Sig Start Date End Date Taking? Authorizing Provider  levothyroxine (EUTHYROX) 88 MCG tablet Take 1 tablet (88 mcg total) by mouth daily before breakfast. 10/09/20   Ria Bush, MD  predniSONE (STERAPRED UNI-PAK 21 TAB) 10 MG (21) TBPK tablet Take by mouth daily. Take 6 tabs by mouth daily  for 2 days, then 5 tabs for 2 days, then 4 tabs for 2 days, then 3 tabs for 2 days, 2 tabs for 2 days, then 1 tab by mouth daily for 2 days 10/26/20   Margarette Canada, NP  traMADol (ULTRAM) 50 MG tablet Take 2 tablets (100 mg total) by mouth every 6 (six) hours as needed. 10/26/20   Margarette Canada, NP    Physical Exam: Vitals:   10/28/20 1100  10/28/20 1130 10/28/20 1200 10/28/20 1230  BP: 138/81 134/82 127/80 130/78  Pulse: 76 73 82 73  Resp: 16 15 20 19   Temp:      TempSrc:      SpO2: 100% 94% 95% 95%  Weight:      Height:         Vitals:   10/28/20 1100 10/28/20 1130 10/28/20 1200 10/28/20 1230  BP: 138/81 134/82 127/80 130/78  Pulse: 76 73 82 73  Resp: 16 15 20 19   Temp:      TempSrc:      SpO2: 100% 94% 95% 95%  Weight:      Height:          Constitutional: Alert and oriented x 3 .  Appears uncomfortable.   HEENT:      Head: Normocephalic and atraumatic.         Eyes: PERLA, EOMI, Conjunctivae are normal. Sclera is non-icteric.       Mouth/Throat: Mucous membranes are moist.  Neck: Supple with no signs of meningismus. Cardiovascular:  Tachycardic. No murmurs, gallops, or rubs. 2+ symmetrical distal pulses are present . No JVD. No LE edema Respiratory: Respiratory effort normal .Lungs sounds clear bilaterally. No wheezes, crackles, or rhonchi.  Gastrointestinal: Soft, non tender, and non distended with positive bowel sounds.  Genitourinary: No CVA tenderness. Musculoskeletal: Nontender with normal range of motion in all extremities. No cyanosis, or erythema of extremities. Neurologic:  Face is symmetric. Moving all extremities. No gross focal neurologic deficits  Skin: Skin is warm, dry.  No rash or ulcers Psychiatric: Mood and affect are normal   Labs on Admission: I have personally reviewed following labs and imaging studies  CBC: Recent Labs  Lab 10/28/20 0831  WBC 29.5*  NEUTROABS 25.2*  HGB 16.2*  HCT 44.0  MCV 92.4  PLT 017*   Basic Metabolic Panel: Recent Labs  Lab 10/28/20 0831  NA 136  K 2.9*  CL 101  CO2 19*  GLUCOSE 239*  BUN 10  CREATININE 0.91  CALCIUM 8.7*   GFR: Estimated Creatinine Clearance: 79.7 mL/min (by C-G formula based on SCr of 0.91 mg/dL). Liver Function Tests: Recent Labs  Lab 10/28/20 0831  AST 25  ALT 15  ALKPHOS 58  BILITOT 0.9  PROT 7.3   ALBUMIN 4.0   No results for input(s): LIPASE, AMYLASE in the last 168 hours. No results for input(s): AMMONIA in the last 168 hours. Coagulation Profile: Recent Labs  Lab 10/28/20 0831  INR 1.0   Cardiac Enzymes: No results for input(s): CKTOTAL, CKMB, CKMBINDEX, TROPONINI in the last 168 hours. BNP (last 3 results) No results for input(s): PROBNP in the last 8760 hours. HbA1C: No results for input(s): HGBA1C in the last 72 hours. CBG: No results for input(s): GLUCAP in the last 168 hours. Lipid Profile: No results for input(s): CHOL, HDL, LDLCALC, TRIG, CHOLHDL, LDLDIRECT in the last 72 hours. Thyroid Function Tests: Recent Labs    10/28/20 0831  TSH 1.300  FREET4 1.72*   Anemia Panel: No results for input(s): VITAMINB12, FOLATE, FERRITIN, TIBC, IRON, RETICCTPCT in the last 72 hours. Urine analysis:    Component Value Date/Time   COLORURINE yellow 02/04/2010 1657   APPEARANCEUR Clear 02/04/2010 1657   LABSPEC 1.020 02/04/2010 1657   PHURINE 5.0 02/04/2010 1657   HGBUR trace-intact 02/04/2010 1657   BILIRUBINUR 1+ 12/09/2018 1633   PROTEINUR Negative 12/09/2018 1633   UROBILINOGEN 1.0 12/09/2018 1633   UROBILINOGEN >=8.0 02/04/2010 1657   NITRITE negative 12/09/2018 1633   NITRITE positive 02/04/2010 1657   LEUKOCYTESUR Negative 12/09/2018 1633    Radiological Exams on Admission: DG Chest 2 View  Result Date: 10/28/2020 CLINICAL DATA:  45 year old female who awoke at 0300 hours with difficulty swallowing. Sore throat. Shortness of breath. EXAM: CHEST - 2 VIEW COMPARISON:  Chest radiographs 09/20/2014. FINDINGS: Stable lung volumes. Mediastinal contours remain normal. Visualized tracheal air column is within normal limits. Mild chronic increased pulmonary interstitial markings have not significantly changed. No pneumothorax, pulmonary edema, pleural effusion or confluent pulmonary opacity. No acute osseous abnormality identified. Negative visible bowel gas pattern.  IMPRESSION: No acute cardiopulmonary abnormality. Electronically Signed   By: Genevie Ann M.D.   On: 10/28/2020 09:13   CT Soft Tissue Neck W Contrast  Result Date: 10/28/2020 CLINICAL DATA:  Neck abscess, deep tissue; neck pain. Additional history provided: Difficulty swallowing, sore throat, feeling as though throat is going to "close of" EXAM: CT NECK WITH CONTRAST TECHNIQUE: Multidetector CT imaging of the neck  was performed using the standard protocol following the bolus administration of intravenous contrast. CONTRAST:  85mL OMNIPAQUE IOHEXOL 300 MG/ML  SOLN COMPARISON:  Thyroid ultrasound 10/14/2011. FINDINGS: Pharynx and larynx: Prominence of the palatine tonsils bilaterally. Additionally, there is mucosal/submucosal edema of the pharynx, epiglottis and supraglottic larynx. Additionally, there is a low-density focus with subtle peripheral enhancement in the region of the left glossotonsillar sulcus and abutting the left aspect epiglottis measuring 1.5 x 1.4 cm in transaxial dimensions which may reflect a small tonsillar/peritonsillar abscess (series 2, image 48). Surrounding inflammatory stranding extending into the adjacent left neck soft tissues. There is mild to moderate airway effacement at the level of the supraglottic larynx. Salivary glands: No inflammation, mass, or stone. Thyroid: Unremarkable. Lymph nodes: No pathologically enlarged cervical chain lymph nodes. Vascular: The major vascular structures of the neck are patent. Limited intracranial: No evidence of acute intracranial abnormality within the field of view. Visualized orbits: Incompletely imaged. No mass or acute finding at the imaged levels. Mastoids and visualized paranasal sinuses: No significant paranasal sinus disease or mastoid effusion at the imaged levels. Skeleton: Reversal of the expected cervical lordosis. No acute bony abnormality or aggressive osseous lesion. Upper chest: No consolidation within the imaged lung apices.  IMPRESSION: Prominence of the palatine tonsils bilaterally. Additionally, there is mucosal/submucosal edema of the pharynx, epiglottis and supraglottic larynx. Findings are compatible with pharyngitis/tonsillitis and supraglottitis in the appropriate clinical setting. Additionally, there is a low-density focus with subtle peripheral enhancement measuring 1.5 x 1.4 cm in transaxial dimensions in the region of the left glossotonsillar, suspicious for small tonsillar/peritonsillar abscess. Mild to moderate airway effacement at the level of the supraglottic larynx. Electronically Signed   By: Kellie Simmering DO   On: 10/28/2020 10:51     Assessment/Plan Principal Problem:   Sepsis ([place]) Active Problems:   Hypothyroidism   Bipolar 2 disorder ([place])   Hypokalemia   Abscess, peritonsillar   Acute bacterial tonsillitis     Sepsis ([place]) As evidenced by tachycardia, tachypnea, marked leukocytosis with a left shift, lactic acidosis and peritonsillar abscess Continue aggressive IV fluid resuscitation Place patient empirically on antibiotic therapy with vancomycin and clindamycin Follow-up results of blood cultures   Peritonsillar abscess/acute bacterial tonsillitis/supraglottitis Patient presents for evaluation of shortness of breath and a sensation of her throat closing. CT scan of the neck showed peritonsillar abscess with tonsillitis/supraglottitis with ?  airway compromise Appreciate ENT input Continue broad-spectrum antibiotic therapy with vancomycin and clindamycin We will give Decadron 10 mg IV q 8 hours x 3 doses per ENT recommendation Keep patient n.p.o. for now We will monitor closely in the ICU at least for 24 hours   Hypothyroidism Continue Synthroid   Hypokalemia Supplement potassium Check magnesium levels   DVT prophylaxis: Lovenox Code Status: full code Family Communication: Greater than 50% of time was spent discussing patient's condition and plan of care with her and her  boyfriend at the bedside.  All questions and concerns have been addressed.  They verbalized understanding and agree with the plan. Disposition Plan: Back to previous home environment Consults called: ENT Status: At the time of admission, it appears that the appropriate admission status for this patient is inpatient. This is judged to be reasonable and necessary in order to provide the required intensity of service to ensure the patient's safety given the presenting symptoms, physical exam findings, and initial radiographic and laboratory data in the context of their comorbid conditions. Patient requires inpatient status due to high intensity of service,  high risk for further deterioration and high frequency of surveillance required.    Collier Bullock MD [facility]     10/28/2020, 1:17 PM

## 2020-10-28 NOTE — ED Notes (Signed)
Pt presents to the ED from home. Pt states she started having a sore throat on Thursday and states that she felt like her throat was closing up. Pt went to [facility] 2 days ago where they dx her with pharyngitis and sent her home with a rx of prednisone and tramadol. Pt states she woke up 0300 this morning and having the same sensation of her throat closing up and took a dose of the tramadol and prednisone. Pt then woke up around 0700 with SOB, difficulty swallowing, and fire noted hives. No hives noted on arrival.

## 2020-10-28 NOTE — ED Notes (Signed)
Pt transported to CT scan and X-Ray at this time.

## 2020-10-28 NOTE — Progress Notes (Signed)
Pharmacy Antibiotic Note  Gabrielle Dominguez is a 45 y.o. female admitted on 10/28/2020 with peritonsillar abscess/acute bacterial tonsillitis/supraglottitis.  Pharmacy has been consulted for vancomycin dosing. She received a single 1000 mg IV vancomycin dose in the ED.  Plan: give additional 500 mg IV vancomycin to complete loading dose, then start vancomycin 1250 mg IV Q 24 hrs  Goal AUC 400-550  Expected AUC: 482.2  SCr used: 0.91 mg/dL  Ke: 0.06 h-1, T1/2: 11.5h  Daily renal function while on IV vancomycin  Levels as clinically indicated   Height: 5\' 4"  (162.6 cm) Weight: 85.9 kg (189 lb 6 oz) IBW/kg (Calculated) : 54.7  Temp (24hrs), Avg:97.6 F (36.4 C), Min:97.3 F (36.3 C), Max:97.8 F (36.6 C)  Recent Labs  Lab 10/28/20 0831 10/28/20 1019 10/28/20 1351  WBC 29.5*  --   --   CREATININE 0.91  --   --   LATICACIDVEN 5.6* 4.2* 1.6    Estimated Creatinine Clearance: 82.8 mL/min (by C-G formula based on SCr of 0.91 mg/dL).    Allergies  Allergen Reactions  . Penicillins Hives and Swelling    But can take amoxicillin??    Antimicrobials this admission: vancomycin 4/16 >>  clindamycin 4/16 >>   Microbiology results: 4/16 BCx: pending 4/16 Grp A strep: not detected 4/16 SARS CoV-2: negative 4/16 influenza A/B: negative  Thank you for allowing pharmacy to be a part of this patient's care.  Dallie Piles 10/28/2020 2:25 PM

## 2020-10-28 NOTE — ED Notes (Signed)
ENT doctor at bedside.  

## 2020-10-28 NOTE — ED Notes (Signed)
Informed RN bed assigned [id number]

## 2020-10-28 NOTE — ED Triage Notes (Signed)
Pt via POV from home. Pt states she woke up around 0300 this AM and she had some difficulty swallowing. Pt was recently seen for sore throat and feeling like her throat is about to close up. Pt was prescribed tramadol and prednisone. Pt woke up again around 0730 with SOB, difficulty swallowing, and hives on the pt abdomen. EMS gave, 2mg  Magnesium, 2 Duoneb, 50 Benadryl, 125mg  of Solu-Medrol, 20 of Pepcid, and 500 of NS. On arrival, pt is A&Ox4 and NAD.

## 2020-10-28 NOTE — Progress Notes (Signed)
Patient requesting Benadryl to help with sleep. MD notified, new order received for Benadryl PO  25mg  qhs PRN.

## 2020-10-28 NOTE — Procedures (Signed)
Date: 10/28/20  Preoperative diagnosis:  Odynophagia; tonsillar abscess  Postoperative diagnosis:  Same  Procedure:  Flexible laryngoscopy  Findings:  No masses noted in the nasopharynx, oropharynx, hypopharynx, or supraglottic regions.  No masses on vocal folds and vocal folds fully mobile.  Airway widely patent.  Mild swelling and edema of the left glosso-tonsillar sulcus on to the laryngeal surface of the epiglottis.  Mild pooling of secretions.    Anesthesia:  None  Procedure:  After consent, I placed the flexible laryngoscope through the right naris and advanced it towards the nasopharynx.  The laryngoscope was flex downward and used to examine the oropharynx, hypopharynx, vocal folds, and supraglottic regions.  The patient was asked to phonate to evaluate the mobility of the vocal folds.  The laryngoscope was removed and the patient tolerated the procedure well.  The findings are above.

## 2020-10-28 NOTE — Consult Note (Signed)
CODE SEPSIS - PHARMACY COMMUNICATION  **Broad Spectrum Antibiotics should be administered within 1 hour of Sepsis diagnosis**  Time Code Sepsis Called/Page Received: 1115  Antibiotics Ordered: 0930  Time of 1st antibiotic administration: 1017  Additional action taken by pharmacy: N/A  If necessary, Name of Provider/Nurse Contacted: N/A  Lorna Dibble ,PharmD Clinical Pharmacist  10/28/2020  11:14 AM

## 2020-10-28 NOTE — ED Provider Notes (Signed)
[facility] Emergency Department Provider Note  ____________________________________________   Event Date/Time   First MD Initiated Contact with Patient 10/28/20 [phone]     (approximate)  I have reviewed the triage vital signs and the nursing notes.   HISTORY  Chief Complaint Allergic Reaction   HPI Gabrielle Dominguez is a 45 y.o. female with possible history of hypothyroidism on Synthroid, IBS, endometriosis and bipolar disorder who presents for assessment via EMS from home after she awoke from sleep with acute onset of difficulty breathing and worsening of some sore throat and left-sided neck pain.  Patient states her sore throat and neck pain started on 4/14 and she went to urgent care where she was tested for strep and given a shot of Toradol and started on prednisone and tramadol but since then has developed worsening pain over the last day or so and did not have significant shortness of breath difficulty breathing or sensation of her throat closing until waking up this morning.  She states she is only allergic to penicillins and is never had allergies to Toradol, prednisone or tramadol.  She denies any headache or earache, fevers, chest pain, cough, abdominal pain, nausea, vomiting, diarrhea, dysuria, rash or recent injuries or falls.  No prior similar episodes.  No clear alleviating aggravating factors aside from meds given by EMS.  Per EMS they were concerned for possible anaphylaxis as they suspected she may have had some hives although patient denies any rashes or itching.  She was given 2x 0.3 mg IM epi, 50 mg of Benadryl, 125 mg of slight Medrol, 20 mg of famotidine, 2 mg of mag, 2 times albuterol treatments and 500 cc of normal saline.         Past Medical History:  Diagnosis Date  . Abnormal drug screen 02/2015   inapprop pos oxycodone (02/2015)  . Abnormal Pap smear   . Bipolar 2 disorder ([place])   . History of chicken pox   . History of endometriosis    . History of pneumonia 07/2010, 04/2011  . Hypothyroidism   . IBS (irritable bowel syndrome)   . Panic disorder   . Worsening headaches     Patient Active Problem List   Diagnosis Date Noted  . Health maintenance examination 08/27/2019  . Anxiety state 11/13/2018  . Adjustment disorder with depressed mood 03/24/2017  . S/P laparoscopic assisted vaginal hysterectomy (LAVH) 05/10/2014  . Bipolar 2 disorder ([place]) 04/15/2014  . Lymphedema of left lower extremity 03/29/2013  . Obesity, Class I, BMI 30.0-34.9 (see actual BMI) 08/20/2012  . Panic disorder 10/10/2011  . Hypothyroidism   . History of endometriosis 03/26/2011  . HEMATURIA UNSPECIFIED 02/04/2010    Past Surgical History:  Procedure Laterality Date  . ABDOMINAL HYSTERECTOMY    . BILATERAL SALPINGECTOMY N/A 05/10/2014   Procedure: BILATERAL SALPINGECTOMY;  Surgeon: Donnamae Jude, MD;  Location: [facility];  Service: Gynecology;  Laterality: N/A;  . CESAREAN SECTION  2000  . COLONOSCOPY  10/2012   normal, mult random biopsies obtained, normal [place])  . LAPAROSCOPIC ASSISTED VAGINAL HYSTERECTOMY N/A 05/10/2014   Procedure: LAPAROSCOPIC ASSISTED VAGINAL HYSTERECTOMY;  Surgeon: Donnamae Jude, MD;  Location: [facility];  Service: Gynecology;  Laterality: N/A;  . LAPAROSCOPIC ENDOMETRIOSIS FULGURATION  2002 and 2009    Prior to Admission medications   Medication Sig Start Date End Date Taking? Authorizing Provider  levothyroxine (EUTHYROX) 88 MCG tablet Take 1 tablet (88 mcg total) by mouth daily before breakfast. 10/09/20  Ria Bush, MD  predniSONE (STERAPRED UNI-PAK 21 TAB) 10 MG (21) TBPK tablet Take by mouth daily. Take 6 tabs by mouth daily  for 2 days, then 5 tabs for 2 days, then 4 tabs for 2 days, then 3 tabs for 2 days, 2 tabs for 2 days, then 1 tab by mouth daily for 2 days 10/26/20   Margarette Canada, NP  traMADol (ULTRAM) 50 MG tablet Take 2 tablets (100 mg total) by mouth every 6 (six) hours as needed. 10/26/20   Margarette Canada, NP    Allergies Penicillins  Family History  Problem Relation Age of Onset  . Heart attack Paternal Grandfather   . Hypertension Paternal Grandfather   . Hypertension Maternal Grandmother   . Hypertension Father   . Melanoma Father   . Cancer Mother        vulvar  . Hypertension Mother   . Stroke Mother   . Irritable bowel syndrome Mother   . Depression Mother   . Colon cancer Maternal Aunt 25  . Hypertension Brother   . Stroke Maternal Grandfather   . Melanoma Cousin   . Diabetes Neg Hx   . Breast cancer Neg Hx     Social History Social History   Tobacco Use  . Smoking status: Former Smoker    Types: Cigarettes    Quit date: 07/15/2002    Years since quitting: 18.3  . Smokeless tobacco: Never Used  Vaping Use  . Vaping Use: Never used  Substance Use Topics  . Alcohol use: Yes    Alcohol/week: 0.0 standard drinks    Comment: rarely- social  . Drug use: No    Review of Systems  Review of Systems  Constitutional: Positive for malaise/fatigue. Negative for chills and fever.  HENT: Positive for sore throat.   Eyes: Negative for pain.  Respiratory: Positive for shortness of breath. Negative for cough and stridor.   Cardiovascular: Negative for chest pain.  Gastrointestinal: Negative for vomiting.  Genitourinary: Negative for dysuria.  Musculoskeletal: Positive for neck pain.  Skin: Negative for rash.  Neurological: Negative for seizures, loss of consciousness and headaches.  Psychiatric/Behavioral: Negative for suicidal ideas.  All other systems reviewed and are negative.     ____________________________________________   PHYSICAL EXAM:  VITAL SIGNS: ED Triage Vitals  Enc Vitals Group     BP      Pulse      Resp      Temp      Temp src      SpO2      Weight      Height      Head Circumference      Peak Flow      Pain Score      Pain Loc      Pain Edu?      Excl. in [place]?    Vitals:   10/28/20 0836 10/28/20 1015  BP: (!) 142/72 (!)  149/77  Pulse: (!) 115 95  Resp: (!) 38 14  Temp: 97.8 F (36.6 C)   SpO2: 100% 97%   Physical Exam Vitals and nursing note reviewed.  Constitutional:      General: She is in acute distress.     Appearance: She is well-developed. She is ill-appearing.  HENT:     Head: Normocephalic and atraumatic.     Right Ear: External ear normal.     Left Ear: External ear normal.     Nose: Nose normal.     Mouth/Throat:  Pharynx: Posterior oropharyngeal erythema present. No oropharyngeal exudate.     Tonsils: 1+ on the right. 1+ on the left.  Eyes:     Conjunctiva/sclera: Conjunctivae normal.  Cardiovascular:     Rate and Rhythm: Regular rhythm. Tachycardia present.     Heart sounds: No murmur heard.   Pulmonary:     Effort: Pulmonary effort is normal. Tachypnea present. No respiratory distress.     Breath sounds: Normal breath sounds.  Abdominal:     Palpations: Abdomen is soft.     Tenderness: There is no abdominal tenderness.  Musculoskeletal:     Cervical back: Neck supple.  Lymphadenopathy:     Cervical: Cervical adenopathy present.     Left cervical: Superficial cervical adenopathy and deep cervical adenopathy present.  Skin:    General: Skin is warm and dry.     Capillary Refill: Capillary refill takes less than 2 seconds.  Neurological:     Mental Status: She is alert and oriented to person, place, and time.  Psychiatric:        Mood and Affect: Mood normal.      ____________________________________________   LABS (all labs ordered are listed, but only abnormal results are displayed)  Labs Reviewed  CBC WITH DIFFERENTIAL/PLATELET - Abnormal; Notable for the following components:      Result Value   WBC 29.5 (*)    Hemoglobin 16.2 (*)    MCHC 36.8 (*)    Platelets 417 (*)    Neutro Abs 25.2 (*)    Monocytes Absolute 1.1 (*)    Abs Immature Granulocytes 0.18 (*)    All other components within normal limits  COMPREHENSIVE METABOLIC PANEL - Abnormal; Notable  for the following components:   Potassium 2.9 (*)    CO2 19 (*)    Glucose, Bld 239 (*)    Calcium 8.7 (*)    Anion gap 16 (*)    All other components within normal limits  LACTIC ACID, PLASMA - Abnormal; Notable for the following components:   Lactic Acid, Venous 5.6 (*)    All other components within normal limits  LACTIC ACID, PLASMA - Abnormal; Notable for the following components:   Lactic Acid, Venous 4.2 (*)    All other components within normal limits  T4, FREE - Abnormal; Notable for the following components:   Free T4 1.72 (*)    All other components within normal limits  GROUP A STREP BY PCR  RESP PANEL BY RT-PCR (FLU A&B, COVID) ARPGX2  CULTURE, BLOOD (ROUTINE X 2)  CULTURE, BLOOD (ROUTINE X 2)  HCG, QUANTITATIVE, PREGNANCY  PROTIME-INR  APTT  TSH  PROCALCITONIN   ____________________________________________  EKG  Sinus tachycardia with a ventricular rate of 118, normal axis, unremarkable intervals with some artifact versus nonspecific change in lateral leads with no other clear evidence of acute ischemia or other significant underlying arrhythmia. ____________________________________________  RADIOLOGY  ED MD interpretation: Chest x-ray has no focal consolidation, effusion, significant edema or other clear acute intrathoracic process.  Official radiology report(s): DG Chest 2 View  Result Date: 10/28/2020 CLINICAL DATA:  45 year old female who awoke at 0300 hours with difficulty swallowing. Sore throat. Shortness of breath. EXAM: CHEST - 2 VIEW COMPARISON:  Chest radiographs 09/20/2014. FINDINGS: Stable lung volumes. Mediastinal contours remain normal. Visualized tracheal air column is within normal limits. Mild chronic increased pulmonary interstitial markings have not significantly changed. No pneumothorax, pulmonary edema, pleural effusion or confluent pulmonary opacity. No acute osseous abnormality identified. Negative visible bowel gas  pattern. IMPRESSION: No  acute cardiopulmonary abnormality. Electronically Signed   By: Genevie Ann M.D.   On: 10/28/2020 09:13   CT Soft Tissue Neck W Contrast  Result Date: 10/28/2020 CLINICAL DATA:  Neck abscess, deep tissue; neck pain. Additional history provided: Difficulty swallowing, sore throat, feeling as though throat is going to "close of" EXAM: CT NECK WITH CONTRAST TECHNIQUE: Multidetector CT imaging of the neck was performed using the standard protocol following the bolus administration of intravenous contrast. CONTRAST:  70mL OMNIPAQUE IOHEXOL 300 MG/ML  SOLN COMPARISON:  Thyroid ultrasound 10/14/2011. FINDINGS: Pharynx and larynx: Prominence of the palatine tonsils bilaterally. Additionally, there is mucosal/submucosal edema of the pharynx, epiglottis and supraglottic larynx. Additionally, there is a low-density focus with subtle peripheral enhancement in the region of the left glossotonsillar sulcus and abutting the left aspect epiglottis measuring 1.5 x 1.4 cm in transaxial dimensions which may reflect a small tonsillar/peritonsillar abscess (series 2, image 48). Surrounding inflammatory stranding extending into the adjacent left neck soft tissues. There is mild to moderate airway effacement at the level of the supraglottic larynx. Salivary glands: No inflammation, mass, or stone. Thyroid: Unremarkable. Lymph nodes: No pathologically enlarged cervical chain lymph nodes. Vascular: The major vascular structures of the neck are patent. Limited intracranial: No evidence of acute intracranial abnormality within the field of view. Visualized orbits: Incompletely imaged. No mass or acute finding at the imaged levels. Mastoids and visualized paranasal sinuses: No significant paranasal sinus disease or mastoid effusion at the imaged levels. Skeleton: Reversal of the expected cervical lordosis. No acute bony abnormality or aggressive osseous lesion. Upper chest: No consolidation within the imaged lung apices. IMPRESSION: Prominence  of the palatine tonsils bilaterally. Additionally, there is mucosal/submucosal edema of the pharynx, epiglottis and supraglottic larynx. Findings are compatible with pharyngitis/tonsillitis and supraglottitis in the appropriate clinical setting. Additionally, there is a low-density focus with subtle peripheral enhancement measuring 1.5 x 1.4 cm in transaxial dimensions in the region of the left glossotonsillar, suspicious for small tonsillar/peritonsillar abscess. Mild to moderate airway effacement at the level of the supraglottic larynx. Electronically Signed   By: Kellie Simmering DO   On: 10/28/2020 10:51    ____________________________________________   PROCEDURES  Procedure(s) performed (including Critical Care):  .Critical Care Performed by: Lucrezia Starch, MD Authorized by: Lucrezia Starch, MD   Critical care provider statement:    Critical care time (minutes):  45   Critical care time was exclusive of:  Separately billable procedures and treating other patients   Critical care was necessary to treat or prevent imminent or life-threatening deterioration of the following conditions:  Sepsis and shock   Critical care was time spent personally by me on the following activities:  Discussions with consultants, evaluation of patient's response to treatment, examination of patient, ordering and performing treatments and interventions, ordering and review of laboratory studies, ordering and review of radiographic studies, pulse oximetry, re-evaluation of patient's condition, obtaining history from patient or surrogate and review of old charts     ____________________________________________   INITIAL IMPRESSION / [place] / ED COURSE      Patient presents with above-stated exam for assessment of acute onset of some shortness of breath and sensation of her throat tightening in the setting of couple days of worsening sore throat and left-sided neck pain.  While EMS commented they  thought she may have had some urticaria she denies any rash or itching or other associated symptoms although she does note her breathing seemed improved  with meds given by EMS noted above.  On arrival she is tachycardic with heart rate of 115, tachypneic at 38 with otherwise stable vital signs on room air.  She has some enlargement of the left tonsil but no exudates or uvular deviation.  She has some left-sided anterior cervical and submandibular lymphadenopathy and tenderness on exam over the left inferior neck.  She is not meningitic and is tolerating her secretions.  Lungs are clear bilaterally.  Differential includes possible anaphylaxis, deep space infection in the neck, tracheitis and epiglottitis, and thyrotoxicosis.  CBC with a WBC count of 29.5, hemoglobin of 16.2 and platelets of 417.  CMP remarkable for K of 2.9, bicarb 19, glucose of 239 without any other significant derangements.  Initial lactic acid was elevated at 5.6.  Covid and strep screen is negative.  hCG is 1.  PT and INR is unremarkable.  TSH is 1.3.  Free T4 is elevated 1.73.  Chest x-ray is unremarkable for evidence of pneumonia pneumothorax or other acute thoracic process or explain patient's shortness of breath.  CT neck concerning for bilateral tonsillitis with possible early developing PTA in the left as well evidence of pharyngitis tonsillitis and supraglottitis.  There is also mild to moderate airway effacement at the level supraglottic airway.  Discussed these findings and concern for septic shock from deep space infection of neck with on-call otolaryngologist Dr. Sheppard Coil he said he would come see the patient and recommended medicine admission.  Plan admit to medicine service for further evaluation and management.          ____________________________________________   FINAL CLINICAL IMPRESSION(S) / ED DIAGNOSES  Final diagnoses:  Sepsis, due to unspecified organism, unspecified whether acute organ  dysfunction present [facility])  Peritonsillar abscess  Tonsillitis  Pharyngitis, unspecified etiology    Medications  potassium chloride SA (KLOR-CON) CR tablet 40 mEq (40 mEq Oral Not Given 10/28/20 1101)  levofloxacin (LEVAQUIN) IVPB 750 mg (750 mg Intravenous New Bag/Given 10/28/20 1019)  lactated ringers infusion (has no administration in time range)  lactated ringers bolus 1,000 mL (0 mLs Intravenous Stopped 10/28/20 0930)  morphine 4 MG/ML injection 4 mg (4 mg Intravenous Given 10/28/20 0842)  lidocaine (XYLOCAINE) 2 % viscous mouth solution 15 mL (15 mLs Mouth/Throat Given 10/28/20 1022)  lactated ringers bolus 1,000 mL (1,000 mLs Intravenous New Bag/Given 10/28/20 1020)  clindamycin (CLEOCIN) IVPB 600 mg (0 mg Intravenous Stopped 10/28/20 1101)  iohexol (OMNIPAQUE) 300 MG/ML solution 75 mL (75 mLs Intravenous Contrast Given 10/28/20 0944)     ED Discharge Orders    None       Note:  This document was prepared using Dragon voice recognition software and may include unintentional dictation errors.   Lucrezia Starch, MD 10/28/20 1113

## 2020-10-28 NOTE — ED Notes (Addendum)
This RN administer viscious lidocaine. Pt states it is difficult for her to swallow. Pt began to cough and unable to catch her breath after swallowing medication. Pt began to hyperventilate. Dr. Creig Hines made aware. Pt will not be able to take PO K+.

## 2020-10-28 NOTE — Plan of Care (Signed)
A&O patient, from home with throat pain

## 2020-10-28 NOTE — Progress Notes (Signed)
Pt arrived on unit at this time. Pt alert and oriented. Pt says she feels like she cannot swallow and her throat is sore. 2L [place] in place for comfort. VSS.

## 2020-10-29 LAB — BASIC METABOLIC PANEL
Anion gap: 8 (ref 5–15)
BUN: 10 mg/dL (ref 6–20)
CO2: 25 mmol/L (ref 22–32)
Calcium: 8.8 mg/dL — ABNORMAL LOW (ref 8.9–10.3)
Chloride: 106 mmol/L (ref 98–111)
Creatinine, Ser: 0.61 mg/dL (ref 0.44–1.00)
GFR, Estimated: >60 mL/min (ref >60)
Glucose, Bld: 135 mg/dL — ABNORMAL HIGH (ref 70–99)
Potassium: 4.4 mmol/L (ref 3.5–5.1)
Sodium: 139 mmol/L (ref 135–145)

## 2020-10-29 LAB — MRSA PCR SCREENING: MRSA by PCR: NEGATIVE

## 2020-10-29 LAB — CBC
HCT: 37 % (ref 36.0–46.0)
Hemoglobin: 12.8 g/dL (ref 12.0–15.0)
MCH: 33.2 pg (ref 26.0–34.0)
MCHC: 34.6 g/dL (ref 30.0–36.0)
MCV: 96.1 fL (ref 80.0–100.0)
Platelets: 275 10*3/uL (ref 150–400)
RBC: 3.85 MIL/uL — ABNORMAL LOW (ref 3.87–5.11)
RDW: 11.9 % (ref 11.5–15.5)
WBC: 24.6 10*3/uL — ABNORMAL HIGH (ref 4.0–10.5)
nRBC: 0 % (ref 0.0–0.2)

## 2020-10-29 LAB — PROTIME-INR
INR: 1.2 (ref 0.8–1.2)
Prothrombin Time: 14.7 seconds (ref 11.4–15.2)

## 2020-10-29 LAB — PROCALCITONIN: Procalcitonin: 0.6 ng/mL

## 2020-10-29 LAB — CORTISOL-AM, BLOOD: Cortisol - AM: 1.3 ug/dL — ABNORMAL LOW (ref 6.7–22.6)

## 2020-10-29 MED ORDER — MAGIC MOUTHWASH W/LIDOCAINE
1.0000 mL | Freq: Four times a day (QID) | ORAL | Status: DC
  Administered 2020-10-29 – 2020-11-01 (×10): 1 mL via ORAL
  Filled 2020-10-29 (×15): qty 1

## 2020-10-29 MED ORDER — MENTHOL 3 MG MT LOZG
1.0000 | LOZENGE | OROMUCOSAL | Status: DC | PRN
  Filled 2020-10-29 (×2): qty 9

## 2020-10-29 NOTE — Progress Notes (Signed)
Report called to [place] on 1C. 1207 transferred via W/C to 103.

## 2020-10-29 NOTE — Hospital Course (Signed)
45 year old white female IBS hypothyroid prior endometriosis bipolar quit smoking 2004-sore throat, dysphagia Rx urgent care 4/14-Toradol prednisone tramadol-developed a sensation of throat closing-no prior symptoms EMS was called given concern for anaphylaxis-Rx epi X2, Benadryl 50, Solu-Medrol 125, famotidine magnesium albuterol and normal saline CT scan neck = abscess left glossotonsillar sulcus?  Compromised airway Lactic acidosis 5.6 on admission white count 29,000-EKG sinus tachycardia Prior history of recurrent tonsil infections in the distant past Seen by Dr. Rowland Lathe in the ED-recommendations clindamycin, Decadron X3 doses  Started on vancomycin clindamycin patient kept n.p.o. and admitted to ICU for observation at least overnight  Data lactic acid 5.6-->1.2 Procalcitonin 0.1-->0.6 Bicarb 19-->25 White count 29.5-->24.6 Hemoglobin 16.2-->12.8 Potassium 2.9-->4.4 after replacement

## 2020-10-29 NOTE — Progress Notes (Signed)
PROGRESS NOTE   Gabrielle Dominguez  HER:[id number] DOB: Jun 04, 1976 DOA: 10/28/2020 PCP: Ria Bush, MD  Brief Narrative:  45 year old white female IBS hypothyroid prior endometriosis bipolar quit smoking 2004-sore throat, dysphagia Rx urgent care 4/14-Toradol prednisone tramadol-developed a sensation of throat closing-no prior symptoms EMS was called given concern for anaphylaxis-Rx epi X2, Benadryl 50, Solu-Medrol 125, famotidine magnesium albuterol and normal saline CT scan neck = abscess left glossotonsillar sulcus?  Compromised airway Lactic acidosis 5.6 on admission white count 29,000-EKG sinus tachycardia Prior history of recurrent tonsil infections in the distant past Seen by Dr. Rowland Lathe in the ED-recommendations clindamycin, Decadron X3 doses  Started on vancomycin clindamycin patient kept n.p.o. and admitted to ICU for observation at least overnight  Hospital-Problem based course  Left glossotonsillar abscess-small Appreciate ENT input-has tolerated liquids with meds so will graduate to clear liquid diet Continue clindamycin vancomycin at this time [place] lozenge DC IV fluids From my perspective can transfer to the floor later today when bed is available Hypokalemia on admission Replaced aggressively initially with IV fluid containing K which has been discontinued Periodic labs Hypothyroidism Continue Synthroid-outpatient TSH 3 weeks Clinically stable medical conditions are endometriosis, IBS  DVT prophylaxis: Lovenox Code Status: Full Family Communication: Updated patient Disposition:  Status is: Inpatient  Remains inpatient appropriate because:Hemodynamically unstable and Unsafe d/c plan   Dispo: The patient is from: Home              Anticipated d/c is to: Home              Patient currently is not medically stable to d/c.   Difficult to place patient No       Consultants:   ENT  Procedures: CT  Antimicrobials: As  above   Subjective: Feels better-somewhat tired-no chest pain or fever Still has difficulty swallowing but was able to ambulate in the room to get to the restroom, swallowed meds with a sip of water Hunger  Objective: Vitals:   10/29/20 0400 10/29/20 0500 10/29/20 0600 10/29/20 0723  BP: 116/78 122/75 109/70 115/77  Pulse: (!) 57 69 (!) 59 66  Resp:      Temp:      TempSrc:      SpO2: 98% 100% 100% 90%  Weight:      Height:        Intake/Output Summary (Last 24 hours) at 10/29/2020 0850 Last data filed at 10/29/2020 0700 Gross per 24 hour  Intake 4691.02 ml  Output 900 ml  Net 3791.02 ml   Filed Weights   10/28/20 0834 10/28/20 1330  Weight: 79.8 kg 85.9 kg    Examination:  Awake coherent no distress EOMI NCAT Mallampati 4-tonsils hard to visualize but is able to phonate well Slight tenderness has left greater than right submandibular adenopathy Cool to touch S1-S2 no murmur Abdomen soft No lower extremity edema Chest clear S1-S2 sinus  Data Reviewed: personally reviewed   CBC    Component Value Date/Time   WBC 24.6 (H) 10/29/2020 0336   RBC 3.85 (L) 10/29/2020 0336   HGB 12.8 10/29/2020 0336   HGB 15.1 03/13/2013 1123   HCT 37.0 10/29/2020 0336   HCT 43.1 03/13/2013 1123   PLT 275 10/29/2020 0336   PLT 307 03/13/2013 1123   MCV 96.1 10/29/2020 0336   MCV 91 03/13/2013 1123   MCH 33.2 10/29/2020 0336   MCHC 34.6 10/29/2020 0336   RDW 11.9 10/29/2020 0336   RDW 12.3 03/13/2013 1123   LYMPHSABS 3.0 10/28/2020  0831   MONOABS 1.1 (H) 10/28/2020 0831   EOSABS 0.0 10/28/2020 0831   BASOSABS 0.1 10/28/2020 0831   CMP Latest Ref Rng & Units 10/29/2020 10/28/2020 10/03/2020  Glucose 70 - 99 mg/dL 135(H) 239(H) 98  BUN 6 - 20 mg/dL 10 10 11   Creatinine 0.44 - 1.00 mg/dL 0.61 0.91 0.84  Sodium 135 - 145 mmol/L 139 136 139  Potassium 3.5 - 5.1 mmol/L 4.4 2.9(L) 4.1  Chloride 98 - 111 mmol/L 106 101 104  CO2 22 - 32 mmol/L 25 19(L) 28  Calcium 8.9 - 10.3  mg/dL 8.8(L) 8.7(L) 9.0  Total Protein 6.5 - 8.1 g/dL - 7.3 6.3  Total Bilirubin 0.3 - 1.2 mg/dL - 0.9 0.5  Alkaline Phos 38 - 126 U/L - 58 58  AST 15 - 41 U/L - 25 12  ALT 0 - 44 U/L - 15 13     Radiology Studies: DG Chest 2 View  Result Date: 10/28/2020 CLINICAL DATA:  45 year old female who awoke at 0300 hours with difficulty swallowing. Sore throat. Shortness of breath. EXAM: CHEST - 2 VIEW COMPARISON:  Chest radiographs 09/20/2014. FINDINGS: Stable lung volumes. Mediastinal contours remain normal. Visualized tracheal air column is within normal limits. Mild chronic increased pulmonary interstitial markings have not significantly changed. No pneumothorax, pulmonary edema, pleural effusion or confluent pulmonary opacity. No acute osseous abnormality identified. Negative visible bowel gas pattern. IMPRESSION: No acute cardiopulmonary abnormality. Electronically Signed   By: Genevie Ann M.D.   On: 10/28/2020 09:13   CT Soft Tissue Neck W Contrast  Result Date: 10/28/2020 CLINICAL DATA:  Neck abscess, deep tissue; neck pain. Additional history provided: Difficulty swallowing, sore throat, feeling as though throat is going to "close of" EXAM: CT NECK WITH CONTRAST TECHNIQUE: Multidetector CT imaging of the neck was performed using the standard protocol following the bolus administration of intravenous contrast. CONTRAST:  66mL OMNIPAQUE IOHEXOL 300 MG/ML  SOLN COMPARISON:  Thyroid ultrasound 10/14/2011. FINDINGS: Pharynx and larynx: Prominence of the palatine tonsils bilaterally. Additionally, there is mucosal/submucosal edema of the pharynx, epiglottis and supraglottic larynx. Additionally, there is a low-density focus with subtle peripheral enhancement in the region of the left glossotonsillar sulcus and abutting the left aspect epiglottis measuring 1.5 x 1.4 cm in transaxial dimensions which may reflect a small tonsillar/peritonsillar abscess (series 2, image 48). Surrounding inflammatory stranding  extending into the adjacent left neck soft tissues. There is mild to moderate airway effacement at the level of the supraglottic larynx. Salivary glands: No inflammation, mass, or stone. Thyroid: Unremarkable. Lymph nodes: No pathologically enlarged cervical chain lymph nodes. Vascular: The major vascular structures of the neck are patent. Limited intracranial: No evidence of acute intracranial abnormality within the field of view. Visualized orbits: Incompletely imaged. No mass or acute finding at the imaged levels. Mastoids and visualized paranasal sinuses: No significant paranasal sinus disease or mastoid effusion at the imaged levels. Skeleton: Reversal of the expected cervical lordosis. No acute bony abnormality or aggressive osseous lesion. Upper chest: No consolidation within the imaged lung apices. IMPRESSION: Prominence of the palatine tonsils bilaterally. Additionally, there is mucosal/submucosal edema of the pharynx, epiglottis and supraglottic larynx. Findings are compatible with pharyngitis/tonsillitis and supraglottitis in the appropriate clinical setting. Additionally, there is a low-density focus with subtle peripheral enhancement measuring 1.5 x 1.4 cm in transaxial dimensions in the region of the left glossotonsillar, suspicious for small tonsillar/peritonsillar abscess. Mild to moderate airway effacement at the level of the supraglottic larynx. Electronically Signed   By: Marylyn Ishihara  Golden DO   On: 10/28/2020 10:51     Scheduled Meds: . Chlorhexidine Gluconate Cloth  6 each Topical Daily  . enoxaparin (LOVENOX) injection  40 mg Subcutaneous Q24H  . levothyroxine  88 mcg Oral QAC breakfast   Continuous Infusions: . clindamycin (CLEOCIN) IV Stopped (10/29/20 0135)  . vancomycin 166.7 mL/hr at 10/29/20 0700     LOS: 1 day   Time spent: [place], MD [facility] To contact the attending provider between 7A-7P or the covering provider during after hours 7P-7A, please  log into the web site [email or web address] and access using universal [place] password for that web site. If you do not have the password, please call the hospital operator.  10/29/2020, 8:50 AM

## 2020-10-30 LAB — COMPREHENSIVE METABOLIC PANEL
ALT: 14 U/L (ref 0–44)
AST: 15 U/L (ref 15–41)
Albumin: 3.1 g/dL — ABNORMAL LOW (ref 3.5–5.0)
Alkaline Phosphatase: 43 U/L (ref 38–126)
Anion gap: 8 (ref 5–15)
BUN: 13 mg/dL (ref 6–20)
CO2: 25 mmol/L (ref 22–32)
Calcium: 8.6 mg/dL — ABNORMAL LOW (ref 8.9–10.3)
Chloride: 105 mmol/L (ref 98–111)
Creatinine, Ser: 0.79 mg/dL (ref 0.44–1.00)
GFR, Estimated: >60 mL/min (ref >60)
Glucose, Bld: 93 mg/dL (ref 70–99)
Potassium: 3.5 mmol/L (ref 3.5–5.1)
Sodium: 138 mmol/L (ref 135–145)
Total Bilirubin: 0.6 mg/dL (ref 0.3–1.2)
Total Protein: 6 g/dL — ABNORMAL LOW (ref 6.5–8.1)

## 2020-10-30 LAB — CBC WITH DIFFERENTIAL/PLATELET
Abs Immature Granulocytes: 0.07 10*3/uL (ref 0.00–0.07)
Basophils Absolute: 0 10*3/uL (ref 0.0–0.1)
Basophils Relative: 0 %
Eosinophils Absolute: 0 10*3/uL (ref 0.0–0.5)
Eosinophils Relative: 0 %
HCT: 38.6 % (ref 36.0–46.0)
Hemoglobin: 13.4 g/dL (ref 12.0–15.0)
Immature Granulocytes: 0 %
Lymphocytes Relative: 19 %
Lymphs Abs: 3.1 10*3/uL (ref 0.7–4.0)
MCH: 33.3 pg (ref 26.0–34.0)
MCHC: 34.7 g/dL (ref 30.0–36.0)
MCV: 96 fL (ref 80.0–100.0)
Monocytes Absolute: 1.3 10*3/uL — ABNORMAL HIGH (ref 0.1–1.0)
Monocytes Relative: 8 %
Neutro Abs: 11.6 10*3/uL — ABNORMAL HIGH (ref 1.7–7.7)
Neutrophils Relative %: 73 %
Platelets: 291 10*3/uL (ref 150–400)
RBC: 4.02 MIL/uL (ref 3.87–5.11)
RDW: 12 % (ref 11.5–15.5)
WBC: 16.1 10*3/uL — ABNORMAL HIGH (ref 4.0–10.5)
nRBC: 0 % (ref 0.0–0.2)

## 2020-10-30 LAB — PROCALCITONIN: Procalcitonin: 0.47 ng/mL

## 2020-10-30 LAB — GLUCOSE, CAPILLARY: Glucose-Capillary: 140 mg/dL — ABNORMAL HIGH (ref 70–99)

## 2020-10-30 MED ORDER — SODIUM CHLORIDE 0.9 % IV SOLN: INTRAVENOUS | Status: DC | PRN

## 2020-10-30 MED ORDER — HYDROCODONE-HOMATROPINE 5-1.5 MG/5ML PO SYRP
5.0000 mL | ORAL_SOLUTION | ORAL | Status: DC | PRN
  Administered 2020-10-30: 09:00:00 5 mL via ORAL
  Filled 2020-10-30 (×2): qty 5

## 2020-10-30 MED ORDER — OXYCODONE-ACETAMINOPHEN 7.5-325 MG PO TABS
1.0000 | ORAL_TABLET | ORAL | Status: DC | PRN
Start: 2020-10-30 — End: 2020-11-01
  Administered 2020-10-30 – 2020-11-01 (×9): 1 via ORAL
  Filled 2020-10-30 (×10): qty 1

## 2020-10-30 MED ORDER — DEXAMETHASONE SODIUM PHOSPHATE 10 MG/ML IJ SOLN
8.0000 mg | Freq: Once | INTRAMUSCULAR | Status: AC
  Administered 2020-10-30: 8 mg via INTRAVENOUS
  Filled 2020-10-30: qty 1

## 2020-10-30 MED ORDER — PREDNISONE 5 MG/5ML PO SOLN
60.0000 mg | Freq: Every day | ORAL | Status: DC
  Filled 2020-10-30: qty 60
  Filled 2020-10-30 (×2): qty 30

## 2020-10-30 MED ORDER — DEXAMETHASONE SODIUM PHOSPHATE 10 MG/ML IJ SOLN
10.0000 mg | Freq: Two times a day (BID) | INTRAMUSCULAR | Status: DC
  Administered 2020-10-31 – 2020-11-01 (×3): 10 mg via INTRAVENOUS
  Filled 2020-10-30 (×3): qty 1

## 2020-10-30 MED ORDER — ORAL CARE MOUTH RINSE
15.0000 mL | Freq: Two times a day (BID) | OROMUCOSAL | Status: DC
  Administered 2020-10-30 – 2020-11-01 (×4): 15 mL via OROMUCOSAL

## 2020-10-30 MED ORDER — ALBUTEROL SULFATE (2.5 MG/3ML) 0.083% IN NEBU
2.5000 mg | INHALATION_SOLUTION | RESPIRATORY_TRACT | Status: DC
  Administered 2020-10-30 – 2020-10-31 (×4): 2.5 mg via RESPIRATORY_TRACT
  Filled 2020-10-30 (×4): qty 3

## 2020-10-30 MED ORDER — PREDNISONE 50 MG PO TABS: 60.0000 mg | ORAL_TABLET | Freq: Every day | ORAL | Status: DC

## 2020-10-30 MED ORDER — MORPHINE SULFATE (PF) 2 MG/ML IV SOLN
2.0000 mg | Freq: Once | INTRAVENOUS | Status: AC
Start: 2020-10-30 — End: 2020-10-30
  Administered 2020-10-30: 2 mg via INTRAVENOUS
  Filled 2020-10-30: qty 1

## 2020-10-30 MED ORDER — CLINDAMYCIN HCL 150 MG PO CAPS
300.0000 mg | ORAL_CAPSULE | Freq: Three times a day (TID) | ORAL | Status: DC
  Administered 2020-10-30 – 2020-11-01 (×7): 300 mg via ORAL
  Filled 2020-10-30 (×11): qty 2
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
## 2020-10-30 NOTE — Op Note (Signed)
10/30/2020  6:01 PM    Gabrielle Dominguez  [id number]   Pre-Op Dx: Left pharyngeal infection, with worsening discomfort and shortness of breath  Post-op Dx: No obvious abscess in the hypopharynx, watery edema involving the arytenoids and hypopharynx  Proc: Flexible laryngoscopy  Surg:  [name]  Anes:  GOT  EBL: None  Comp: None  Findings: There is watery edema around the arytenoids but not involving inside the laryngeal inlet.  Vocal cords are pearly white and move well with no swelling or all of the cords or laryngeal inlet.  There is slight watery edema around the vallecula and back of the epiglottis in slight watery edema in both piriform sinuses.  I see no redness or acute inflammation anywhere  Procedure: Patient was seen at the bedside in her room.  The nose was sprayed with 4% Xylocaine mixed with Afrin and some minutes flavoring.  The flexible scope was passed through her right nostril to visualize hypopharynx and larynx.  The nose and nasopharynx are clear with no sign of swelling.  The hypopharynx shows a watery edema as noted above.  I see no acute inflammation or swelling from the left lateral wall to suggest signs of abscess.  The vocal cords move well the midline and opened widely.  Neck is palpated and there are no significant swollen nodes in the neck.  There is no swelling or signs of abscess in the neck that I can feel.  Dispo:   Tolerated well at the bedside  Plan: Discussed this with Dr. Verlon Au.  We will continue the IV Decadron at 10 mg every 12 hours for today and tomorrow to make sure the swelling is settling down.  We will continue to give IV Cleocin for now until she can swallow better and give IV fluids until she is taken more oral intake.  She should significantly improve in the next 24 hours but if not we will consider repeating a CT scan to see if there is evidence of hidden abscess that we cannot see.  [name]  10/30/2020 6:01 PM

## 2020-10-30 NOTE — Progress Notes (Signed)
PROGRESS NOTE   Gabrielle Dominguez  UMP:[id number] DOB: Sep 24, 1975 DOA: 10/28/2020 PCP: Ria Bush, MD  Brief Narrative:  45 year old white female IBS hypothyroid prior endometriosis bipolar quit smoking 2004-sore throat, dysphagia Rx urgent care 4/14-Toradol prednisone tramadol-developed a sensation of throat closing-no prior symptoms EMS was called given concern for anaphylaxis-Rx epi X2, Benadryl 50, Solu-Medrol 125, famotidine magnesium albuterol and normal saline CT scan neck = abscess left glossotonsillar sulcus?  Compromised airway Lactic acidosis 5.6 on admission white count 29,000-EKG sinus tachycardia Prior history of recurrent tonsil infections in the distant past Seen by Dr. Rowland Lathe in the ED-recommendations clindamycin, Decadron X3 doses  Started on vancomycin clindamycin patient kept n.p.o. and admitted to ICU for observation at least overnight  Hospital-Problem based course  Left glossotonsillar abscess-small Appreciate ENT input-has tolerated liquids but with solid diet patient is having severe dysphagia and feels that the swelling is larger although objectively no change from my exam yesterday Transition to p.o. clindamycin if able to tolerate-otherwise switch back to IV clindamycin later today ENT surgeon Dr. Kathyrn Sheriff will evaluate to see if there is a need for surgery later today Hypokalemia on admission Replaced aggressively initially with IV fluid containing K which has been discontinued Periodic labs Hypothyroidism Continue Synthroid-outpatient TSH 3 weeks Clinically stable medical conditions are endometriosis, IBS  DVT prophylaxis: Lovenox Code Status: Full Family Communication: Updated patient at the bedside  Disposition:  Status is: Inpatient  Remains inpatient appropriate because:Hemodynamically unstable and Unsafe d/c plan Patient may require abscess drainage as per ENT discretion when they see her later today-she is not ready for discharge  today  Dispo: The patient is from: Home              Anticipated d/c is to: Home              Patient currently is not medically stable to d/c.   Difficult to place patient No       Consultants:   ENT  Procedures: CT  Antimicrobials: As above   Subjective:  Patient feeling overall better but severe throat swelling and pain still persists Nursing reports to me after I visited with her that she does not feel well enough and had severe pain in swallowing ENT consulted  Objective: Vitals:   10/29/20 1641 10/29/20 1940 10/30/20 0350 10/30/20 0759  BP: 113/75 134/84 113/73 114/75  Pulse: 63 68 60 69  Resp: 16 18 18 16   Temp: 98.1 F (36.7 C) 97.7 F (36.5 C) 98.7 F (37.1 C) 98.4 F (36.9 C)  TempSrc: Oral  Oral   SpO2: 98% 97% 98% 99%  Weight:      Height:        Intake/Output Summary (Last 24 hours) at 10/30/2020 1122 Last data filed at 10/30/2020 1002 Gross per 24 hour  Intake 1103.73 ml  Output --  Net 1103.73 ml   Filed Weights   10/28/20 0834 10/28/20 1330  Weight: 79.8 kg 85.9 kg    Examination:  Awake coherent no distress EOMI NCAT Slightly enlarged on the left left greater than right submandibular adenopathy Chest clear S1-S2 sinus  Data Reviewed: personally reviewed   CBC    Component Value Date/Time   WBC 16.1 (H) 10/30/2020 0437   RBC 4.02 10/30/2020 0437   HGB 13.4 10/30/2020 0437   HGB 15.1 03/13/2013 1123   HCT 38.6 10/30/2020 0437   HCT 43.1 03/13/2013 1123   PLT 291 10/30/2020 0437   PLT 307 03/13/2013 1123   MCV  96.0 10/30/2020 0437   MCV 91 03/13/2013 1123   MCH 33.3 10/30/2020 0437   MCHC 34.7 10/30/2020 0437   RDW 12.0 10/30/2020 0437   RDW 12.3 03/13/2013 1123   LYMPHSABS 3.1 10/30/2020 0437   MONOABS 1.3 (H) 10/30/2020 0437   EOSABS 0.0 10/30/2020 0437   BASOSABS 0.0 10/30/2020 0437   CMP Latest Ref Rng & Units 10/30/2020 10/29/2020 10/28/2020  Glucose 70 - 99 mg/dL 93 135(H) 239(H)  BUN 6 - 20 mg/dL 13 10 10    Creatinine 0.44 - 1.00 mg/dL 0.79 0.61 0.91  Sodium 135 - 145 mmol/L 138 139 136  Potassium 3.5 - 5.1 mmol/L 3.5 4.4 2.9(L)  Chloride 98 - 111 mmol/L 105 106 101  CO2 22 - 32 mmol/L 25 25 19(L)  Calcium 8.9 - 10.3 mg/dL 8.6(L) 8.8(L) 8.7(L)  Total Protein 6.5 - 8.1 g/dL 6.0(L) - 7.3  Total Bilirubin 0.3 - 1.2 mg/dL 0.6 - 0.9  Alkaline Phos 38 - 126 U/L 43 - 58  AST 15 - 41 U/L 15 - 25  ALT 0 - 44 U/L 14 - 15     Radiology Studies: No results found.   Scheduled Meds: . Chlorhexidine Gluconate Cloth  6 each Topical Daily  . clindamycin  300 mg Oral Q8H  . enoxaparin (LOVENOX) injection  40 mg Subcutaneous Q24H  . levothyroxine  88 mcg Oral QAC breakfast  . magic mouthwash w/lidocaine  1 mL Oral QID   Continuous Infusions: . sodium chloride       LOS: 2 days   Time spent: 53  [name], MD [facility] To contact the attending provider between 7A-7P or the covering provider during after hours 7P-7A, please log into the web site [email or web address] and access using universal [place] password for that web site. If you do not have the password, please call the hospital operator.  10/30/2020, 11:22 AM

## 2020-10-31 LAB — CBC WITH DIFFERENTIAL/PLATELET
Abs Immature Granulocytes: 0.07 10*3/uL (ref 0.00–0.07)
Basophils Absolute: 0 10*3/uL (ref 0.0–0.1)
Basophils Relative: 0 %
Eosinophils Absolute: 0 10*3/uL (ref 0.0–0.5)
Eosinophils Relative: 0 %
HCT: 39.9 % (ref 36.0–46.0)
Hemoglobin: 14.2 g/dL (ref 12.0–15.0)
Immature Granulocytes: 1 %
Lymphocytes Relative: 8 %
Lymphs Abs: 0.9 10*3/uL (ref 0.7–4.0)
MCH: 33.9 pg (ref 26.0–34.0)
MCHC: 35.6 g/dL (ref 30.0–36.0)
MCV: 95.2 fL (ref 80.0–100.0)
Monocytes Absolute: 0.6 10*3/uL (ref 0.1–1.0)
Monocytes Relative: 5 %
Neutro Abs: 10.2 10*3/uL — ABNORMAL HIGH (ref 1.7–7.7)
Neutrophils Relative %: 86 %
Platelets: 309 10*3/uL (ref 150–400)
RBC: 4.19 MIL/uL (ref 3.87–5.11)
RDW: 11.6 % (ref 11.5–15.5)
WBC: 11.8 10*3/uL — ABNORMAL HIGH (ref 4.0–10.5)
nRBC: 0 % (ref 0.0–0.2)

## 2020-10-31 LAB — COMPREHENSIVE METABOLIC PANEL
ALT: 16 U/L (ref 0–44)
AST: 14 U/L — ABNORMAL LOW (ref 15–41)
Albumin: 3.1 g/dL — ABNORMAL LOW (ref 3.5–5.0)
Alkaline Phosphatase: 44 U/L (ref 38–126)
Anion gap: 8 (ref 5–15)
BUN: 10 mg/dL (ref 6–20)
CO2: 26 mmol/L (ref 22–32)
Calcium: 8.3 mg/dL — ABNORMAL LOW (ref 8.9–10.3)
Chloride: 102 mmol/L (ref 98–111)
Creatinine, Ser: 0.61 mg/dL (ref 0.44–1.00)
GFR, Estimated: >60 mL/min (ref >60)
Glucose, Bld: 108 mg/dL — ABNORMAL HIGH (ref 70–99)
Potassium: 3.7 mmol/L (ref 3.5–5.1)
Sodium: 136 mmol/L (ref 135–145)
Total Bilirubin: 0.5 mg/dL (ref 0.3–1.2)
Total Protein: 6.1 g/dL — ABNORMAL LOW (ref 6.5–8.1)

## 2020-10-31 MED ORDER — ALBUTEROL SULFATE (2.5 MG/3ML) 0.083% IN NEBU: 2.5000 mg | INHALATION_SOLUTION | RESPIRATORY_TRACT | Status: DC | PRN

## 2020-10-31 MED ORDER — CLINDAMYCIN HCL 300 MG PO CAPS: 300.0000 mg | ORAL_CAPSULE | Freq: Three times a day (TID) | ORAL | 0 refills | Status: DC

## 2020-10-31 MED ORDER — OXYCODONE-ACETAMINOPHEN 7.5-325 MG PO TABS: 1.0000 | ORAL_TABLET | ORAL | 0 refills | Status: DC | PRN

## 2020-10-31 MED ORDER — MAGIC MOUTHWASH W/LIDOCAINE: 1.0000 mL | Freq: Four times a day (QID) | ORAL | 0 refills | Status: DC

## 2020-10-31 NOTE — Progress Notes (Signed)
Patient seen this morning and much improved and is stabilizing I feel she may be able to discharge tomorrow based on discussion with ENT physician See my separate discharge summary for my thoughts and assessment and plan My partner will round on her tomorrow to ensure she is still clinically stable  No charge  Verneita Griffes, MD [facility] 5:07 PM

## 2020-10-31 NOTE — Discharge Summary (Incomplete)
Physician Discharge Summary  Gabrielle Dominguez TOI:[id number] DOB: 1975/11/07 DOA: 10/28/2020  PCP: Ria Bush, MD  Admit date: 10/28/2020 Discharge date: 10/31/2020  Time spent: 40 minutes  Recommendations for Outpatient Follow-up:  1. Complete steroid burst and clindamycin 2. Follow-up with PCP in 10 days 3. Outpatient TSH in about 3 weeks, other preventative labs as per PCP  Discharge Diagnoses:  MAIN problem for hospitalization   Stridor and sepsis secondary to possible left glossotonsillar abscess on admission with compromised airway  Please see below for itemized issues addressed in HOpsital- refer to other progress notes for clarity if needed  Discharge Condition: Improving  Diet recommendation: Heart healthy  Filed Weights   10/28/20 0834 10/28/20 1330  Weight: 79.8 kg 85.9 kg    History of present illness:  45 year old white female IBS hypothyroid prior endometriosis bipolar quit smoking 2004-sore throat, dysphagia Rx urgent care 4/14-Toradol prednisone tramadol-developed a sensation of throat closing-no prior symptoms EMS was called given concern for anaphylaxis-Rx epi X2, Benadryl 50, Solu-Medrol 125, famotidine magnesium albuterol and normal saline CT scan neck = abscess left glossotonsillar sulcus?  Compromised airway Lactic acidosis 5.6 on admission white count 29,000-EKG sinus tachycardia Prior history of recurrent tonsil infections in the distant past Seen by Dr. Rowland Lathe in the ED-recommendations clindamycin, Decadron X3 doses  Started on vancomycin clindamycin patient kept n.p.o. and admitted to ICU for observation at least overnight  Hospital Course:  ***  Procedures:  *** (i.e. Studies not automatically included, echos, thoracentesis, etc; not x-rays)  Consultations:  ***  Discharge Exam: Vitals:   10/31/20 0739 10/31/20 1208  BP: 120/76 130/82  Pulse: (!) 58 68  Resp: 18 18  Temp: 98 F (36.7 C) 98.3 F (36.8 C)  SpO2: 100%  93%    Subj on day of d/c   ***  General Exam on discharge  ***  Discharge Instructions   Discharge Instructions    Diet - low sodium heart healthy   Complete by: As directed    Discharge instructions   Complete by: As directed    Please make sure that you complete the antibiotics and take the steroids as indicated I would recommend you take a probiotic or eat plain yogurt while you are on the antibiotics We have called in some Percocet for you to ensure that you have pain control going forward  please follow closely with your doctor who will see you in the outpatient setting   Increase activity slowly   Complete by: As directed      Allergies as of 10/31/2020      Reactions   Penicillins Hives, Swelling   But can take amoxicillin??      Medication List    STOP taking these medications   traMADol 50 MG tablet Commonly known as: ULTRAM     TAKE these medications   clindamycin 300 MG capsule Commonly known as: CLEOCIN Take 1 capsule (300 mg total) by mouth every 8 (eight) hours for 6 days.   levothyroxine 88 MCG tablet Commonly known as: Euthyrox Take 1 tablet (88 mcg total) by mouth daily before breakfast.   magic mouthwash w/lidocaine Soln Take 1 mL by mouth 4 (four) times daily.   oxyCODONE-acetaminophen 7.5-325 MG tablet Commonly known as: PERCOCET Take 1 tablet by mouth every 4 (four) hours as needed for severe pain.   predniSONE 10 MG (21) Tbpk tablet Commonly known as: STERAPRED UNI-PAK 21 TAB Take by mouth daily. Take 6 tabs by mouth daily  for 2  days, then 5 tabs for 2 days, then 4 tabs for 2 days, then 3 tabs for 2 days, 2 tabs for 2 days, then 1 tab by mouth daily for 2 days      Allergies  Allergen Reactions  . Penicillins Hives and Swelling    But can take amoxicillin??      The results of significant diagnostics from this hospitalization (including imaging, microbiology, ancillary and laboratory) are listed below for reference.     Significant Diagnostic Studies: DG Chest 2 View  Result Date: 10/28/2020 CLINICAL DATA:  45 year old female who awoke at 0300 hours with difficulty swallowing. Sore throat. Shortness of breath. EXAM: CHEST - 2 VIEW COMPARISON:  Chest radiographs 09/20/2014. FINDINGS: Stable lung volumes. Mediastinal contours remain normal. Visualized tracheal air column is within normal limits. Mild chronic increased pulmonary interstitial markings have not significantly changed. No pneumothorax, pulmonary edema, pleural effusion or confluent pulmonary opacity. No acute osseous abnormality identified. Negative visible bowel gas pattern. IMPRESSION: No acute cardiopulmonary abnormality. Electronically Signed   By: Genevie Ann M.D.   On: 10/28/2020 09:13   CT Soft Tissue Neck W Contrast  Result Date: 10/28/2020 CLINICAL DATA:  Neck abscess, deep tissue; neck pain. Additional history provided: Difficulty swallowing, sore throat, feeling as though throat is going to "close of" EXAM: CT NECK WITH CONTRAST TECHNIQUE: Multidetector CT imaging of the neck was performed using the standard protocol following the bolus administration of intravenous contrast. CONTRAST:  14mL OMNIPAQUE IOHEXOL 300 MG/ML  SOLN COMPARISON:  Thyroid ultrasound 10/14/2011. FINDINGS: Pharynx and larynx: Prominence of the palatine tonsils bilaterally. Additionally, there is mucosal/submucosal edema of the pharynx, epiglottis and supraglottic larynx. Additionally, there is a low-density focus with subtle peripheral enhancement in the region of the left glossotonsillar sulcus and abutting the left aspect epiglottis measuring 1.5 x 1.4 cm in transaxial dimensions which may reflect a small tonsillar/peritonsillar abscess (series 2, image 48). Surrounding inflammatory stranding extending into the adjacent left neck soft tissues. There is mild to moderate airway effacement at the level of the supraglottic larynx. Salivary glands: No inflammation, mass, or stone.  Thyroid: Unremarkable. Lymph nodes: No pathologically enlarged cervical chain lymph nodes. Vascular: The major vascular structures of the neck are patent. Limited intracranial: No evidence of acute intracranial abnormality within the field of view. Visualized orbits: Incompletely imaged. No mass or acute finding at the imaged levels. Mastoids and visualized paranasal sinuses: No significant paranasal sinus disease or mastoid effusion at the imaged levels. Skeleton: Reversal of the expected cervical lordosis. No acute bony abnormality or aggressive osseous lesion. Upper chest: No consolidation within the imaged lung apices. IMPRESSION: Prominence of the palatine tonsils bilaterally. Additionally, there is mucosal/submucosal edema of the pharynx, epiglottis and supraglottic larynx. Findings are compatible with pharyngitis/tonsillitis and supraglottitis in the appropriate clinical setting. Additionally, there is a low-density focus with subtle peripheral enhancement measuring 1.5 x 1.4 cm in transaxial dimensions in the region of the left glossotonsillar, suspicious for small tonsillar/peritonsillar abscess. Mild to moderate airway effacement at the level of the supraglottic larynx. Electronically Signed   By: Kellie Simmering DO   On: 10/28/2020 10:51    Microbiology: Recent Results (from the past 240 hour(s))  Group A Strep by PCR     Status: None   Collection Time: 10/26/20  7:27 PM   Specimen: Throat; Sterile Swab  Result Value Ref Range Status   Group A Strep by PCR NOT DETECTED NOT DETECTED Final    Comment: Performed at [facility], [address] [place], [place] [address]  Resp Panel by RT-PCR (Flu A&B, Covid) Nasopharyngeal Swab     Status: None   Collection Time: 10/28/20  8:31 AM   Specimen: Nasopharyngeal Swab; Nasopharyngeal(NP) swabs in vial transport medium  Result Value Ref Range Status   SARS Coronavirus 2 by RT PCR NEGATIVE NEGATIVE Final    Comment: (NOTE) SARS-CoV-2  target nucleic acids are NOT DETECTED.  The SARS-CoV-2 RNA is generally detectable in upper respiratory specimens during the acute phase of infection. The lowest concentration of SARS-CoV-2 viral copies this assay can detect is 138 copies/mL. A negative result does not preclude SARS-Cov-2 infection and should not be used as the sole basis for treatment or other patient management decisions. A negative result may occur with  improper specimen collection/handling, submission of specimen other than nasopharyngeal swab, presence of viral mutation(s) within the areas targeted by this assay, and inadequate number of viral copies(<138 copies/mL). A negative result must be combined with clinical observations, patient history, and epidemiological information. The expected result is Negative.  Fact Sheet for Patients:  EntrepreneurPulse.com.au  Fact Sheet for Healthcare Providers:  IncredibleEmployment.be  This test is no t yet approved or cleared by the [place] FDA and  has been authorized for detection and/or diagnosis of SARS-CoV-2 by FDA under an Emergency Use Authorization (EUA). This EUA will remain  in effect (meaning this test can be used) for the duration of the COVID-19 declaration under Section 564(b)(1) of the Act, 21 U.S.C.section 360bbb-3(b)(1), unless the authorization is terminated  or revoked sooner.       Influenza A by PCR NEGATIVE NEGATIVE Final   Influenza B by PCR NEGATIVE NEGATIVE Final    Comment: (NOTE) The Xpert Xpress SARS-CoV-2/FLU/RSV plus assay is intended as an aid in the diagnosis of influenza from Nasopharyngeal swab specimens and should not be used as a sole basis for treatment. Nasal washings and aspirates are unacceptable for Xpert Xpress SARS-CoV-2/FLU/RSV testing.  Fact Sheet for Patients: EntrepreneurPulse.com.au  Fact Sheet for Healthcare  Providers: IncredibleEmployment.be  This test is not yet approved or cleared by the [place] FDA and has been authorized for detection and/or diagnosis of SARS-CoV-2 by FDA under an Emergency Use Authorization (EUA). This EUA will remain in effect (meaning this test can be used) for the duration of the COVID-19 declaration under Section 564(b)(1) of the Act, 21 U.S.C. section 360bbb-3(b)(1), unless the authorization is terminated or revoked.  Performed at [facility], [place], [place] [address]   Group A Strep by PCR [facility] Only)     Status: None   Collection Time: 10/28/20  9:31 AM   Specimen: Throat; Sterile Swab  Result Value Ref Range Status   Group A Strep by PCR NOT DETECTED NOT DETECTED Final    Comment: Performed at [facility], [address] [place], [place] [address]  Blood culture (routine x 2)     Status: None (Preliminary result)   Collection Time: 10/28/20  9:31 AM   Specimen: BLOOD  Result Value Ref Range Status   Specimen Description BLOOD BLOOD RIGHT HAND  Final   Special Requests   Final    BOTTLES DRAWN AEROBIC AND ANAEROBIC Blood Culture results may not be optimal due to an inadequate volume of blood received in culture bottles   Culture   Final    NO GROWTH 3 DAYS Performed at [facility], [address] [place], [place] [address]    Report  Status PENDING  Incomplete  Blood culture (routine x 2)     Status: None (Preliminary result)   Collection Time: 10/28/20  9:31 AM   Specimen: BLOOD  Result Value Ref Range Status   Specimen Description BLOOD LEFT ANTECUBITAL  Final   Special Requests   Final    BOTTLES DRAWN AEROBIC AND ANAEROBIC Blood Culture adequate volume   Culture   Final    NO GROWTH 3 DAYS Performed at [facility], [address] [place], [place] [address]    Report Status PENDING  Incomplete  MRSA PCR Screening     Status: None   Collection Time: 10/28/20   1:30 PM   Specimen: Nasal Mucosa; Nasopharyngeal  Result Value Ref Range Status   MRSA by PCR NEGATIVE NEGATIVE Final    Comment:        The GeneXpert MRSA Assay (FDA approved for NASAL specimens only), is one component of a comprehensive MRSA colonization surveillance program. It is not intended to diagnose MRSA infection nor to guide or monitor treatment for MRSA infections. Performed at [facility], [address] [place], [place] [address]      Labs: Basic Metabolic Panel: Recent Labs  Lab 10/28/20 0831 10/28/20 1351 10/29/20 0336 10/30/20 0437 10/31/20 0543  NA 136  --  139 138 136  K 2.9*  --  4.4 3.5 3.7  CL 101  --  106 105 102  CO2 19*  --  25 25 26   GLUCOSE 239*  --  135* 93 108*  BUN 10  --  10 13 10   CREATININE 0.91  --  0.61 0.79 0.61  CALCIUM 8.7*  --  8.8* 8.6* 8.3*  MG  --  2.1  --   --   --    Liver Function Tests: Recent Labs  Lab 10/28/20 0831 10/30/20 0437 10/31/20 0543  AST 25 15 14*  ALT 15 14 16   ALKPHOS 58 43 44  BILITOT 0.9 0.6 0.5  PROT 7.3 6.0* 6.1*  ALBUMIN 4.0 3.1* 3.1*   No results for input(s): LIPASE, AMYLASE in the last 168 hours. No results for input(s): AMMONIA in the last 168 hours. CBC: Recent Labs  Lab 10/28/20 0831 10/29/20 0336 10/30/20 0437 10/31/20 0543  WBC 29.5* 24.6* 16.1* 11.8*  NEUTROABS 25.2*  --  11.6* 10.2*  HGB 16.2* 12.8 13.4 14.2  HCT 44.0 37.0 38.6 39.9  MCV 92.4 96.1 96.0 95.2  PLT 417* 275 291 309   Cardiac Enzymes: No results for input(s): CKTOTAL, CKMB, CKMBINDEX, TROPONINI in the last 168 hours. BNP: BNP (last 3 results) No results for input(s): BNP in the last 8760 hours.  ProBNP (last 3 results) No results for input(s): PROBNP in the last 8760 hours.  CBG: Recent Labs  Lab 10/28/20 1331  GLUCAP 140*       Signed:  [name] MD   [facility] 10/31/2020, 5:07 PM

## 2020-10-31 NOTE — Progress Notes (Signed)
10/31/2020 7:51 AM  Gabrielle Dominguez [id number]   Much improved today    Temp:  [97.8 F (36.6 C)-99 F (37.2 C)] 98 F (36.7 C) (04/19 0739) Pulse Rate:  [57-69] 58 (04/19 0739) Resp:  [16-20] 18 (04/19 0739) BP: (107-123)/(57-76) 120/76 (04/19 0739) SpO2:  [98 %-100 %] 100 % (04/19 0739),     Intake/Output Summary (Last 24 hours) at 10/31/2020 0751 Last data filed at 10/31/2020 0349 Gross per 24 hour  Intake 956.26 ml  Output --  Net 956.26 ml    Results for orders placed or performed during the hospital encounter of 10/28/20 (from the past 24 hour(s))  CBC with Differential/Platelet     Status: Abnormal   Collection Time: 10/31/20  5:43 AM  Result Value Ref Range   WBC 11.8 (H) 4.0 - 10.5 K/uL   RBC 4.19 3.87 - 5.11 MIL/uL   Hemoglobin 14.2 12.0 - 15.0 g/dL   HCT 39.9 36.0 - 46.0 %   MCV 95.2 80.0 - 100.0 fL   MCH 33.9 26.0 - 34.0 pg   MCHC 35.6 30.0 - 36.0 g/dL   RDW 11.6 11.5 - 15.5 %   Platelets 309 150 - 400 K/uL   nRBC 0.0 0.0 - 0.2 %   Neutrophils Relative % 86 %   Neutro Abs 10.2 (H) 1.7 - 7.7 K/uL   Lymphocytes Relative 8 %   Lymphs Abs 0.9 0.7 - 4.0 K/uL   Monocytes Relative 5 %   Monocytes Absolute 0.6 0.1 - 1.0 K/uL   Eosinophils Relative 0 %   Eosinophils Absolute 0.0 0.0 - 0.5 K/uL   Basophils Relative 0 %   Basophils Absolute 0.0 0.0 - 0.1 K/uL   Immature Granulocytes 1 %   Abs Immature Granulocytes 0.07 0.00 - 0.07 K/uL  Comprehensive metabolic panel     Status: Abnormal   Collection Time: 10/31/20  5:43 AM  Result Value Ref Range   Sodium 136 135 - 145 mmol/L   Potassium 3.7 3.5 - 5.1 mmol/L   Chloride 102 98 - 111 mmol/L   CO2 26 22 - 32 mmol/L   Glucose, Bld 108 (H) 70 - 99 mg/dL   BUN 10 6 - 20 mg/dL   Creatinine, Ser 0.61 0.44 - 1.00 mg/dL   Calcium 8.3 (L) 8.9 - 10.3 mg/dL   Total Protein 6.1 (L) 6.5 - 8.1 g/dL   Albumin 3.1 (L) 3.5 - 5.0 g/dL   AST 14 (L) 15 - 41 U/L   ALT 16 0 - 44 U/L   Alkaline Phosphatase 44 38 - 126 U/L    Total Bilirubin 0.5 0.3 - 1.2 mg/dL   GFR, Estimated >60 >60 mL/min   Anion gap 8 5 - 15    SUBJECTIVE: Pain is settled down and she is able to swallow some solid food.  She is feeling significantly better today than she did yesterday.  Her voice is clear.  She is not short of breath at all.  OBJECTIVE: She has not spiked any fevers and seems to be improved.  She does not have any swelling in her neck although she still is tender in the left side of her mid neck.  She is able to swallow some food and take liquids and easily last night and this morning.  IMPRESSION: Improving on the medication.  Continue prednisone taper and clindamycin.   PLAN: Hopefully go home tomorrow on oral meds and continue those over the next week to 10 days  Huey Romans  10/31/2020, 7:51 AM

## 2020-11-01 DIAGNOSIS — J029 Acute pharyngitis, unspecified: Secondary | ICD-10-CM

## 2020-11-01 DIAGNOSIS — E039 Hypothyroidism, unspecified: Secondary | ICD-10-CM

## 2020-11-01 LAB — CBC WITH DIFFERENTIAL/PLATELET
Abs Immature Granulocytes: 0.14 10*3/uL — ABNORMAL HIGH (ref 0.00–0.07)
Basophils Absolute: 0 10*3/uL (ref 0.0–0.1)
Basophils Relative: 0 %
Eosinophils Absolute: 0 10*3/uL (ref 0.0–0.5)
Eosinophils Relative: 0 %
HCT: 41.8 % (ref 36.0–46.0)
Hemoglobin: 14.4 g/dL (ref 12.0–15.0)
Immature Granulocytes: 1 %
Lymphocytes Relative: 10 %
Lymphs Abs: 1.2 10*3/uL (ref 0.7–4.0)
MCH: 33 pg (ref 26.0–34.0)
MCHC: 34.4 g/dL (ref 30.0–36.0)
MCV: 95.7 fL (ref 80.0–100.0)
Monocytes Absolute: 0.4 10*3/uL (ref 0.1–1.0)
Monocytes Relative: 3 %
Neutro Abs: 10.5 10*3/uL — ABNORMAL HIGH (ref 1.7–7.7)
Neutrophils Relative %: 86 %
Platelets: 350 10*3/uL (ref 150–400)
RBC: 4.37 MIL/uL (ref 3.87–5.11)
RDW: 11.5 % (ref 11.5–15.5)
WBC: 12.1 10*3/uL — ABNORMAL HIGH (ref 4.0–10.5)
nRBC: 0 % (ref 0.0–0.2)

## 2020-11-01 LAB — COMPREHENSIVE METABOLIC PANEL
ALT: 18 U/L (ref 0–44)
AST: 16 U/L (ref 15–41)
Albumin: 3.2 g/dL — ABNORMAL LOW (ref 3.5–5.0)
Alkaline Phosphatase: 44 U/L (ref 38–126)
Anion gap: 7 (ref 5–15)
BUN: 8 mg/dL (ref 6–20)
CO2: 27 mmol/L (ref 22–32)
Calcium: 8.6 mg/dL — ABNORMAL LOW (ref 8.9–10.3)
Chloride: 104 mmol/L (ref 98–111)
Creatinine, Ser: 0.65 mg/dL (ref 0.44–1.00)
GFR, Estimated: >60 mL/min (ref >60)
Glucose, Bld: 121 mg/dL — ABNORMAL HIGH (ref 70–99)
Potassium: 3.7 mmol/L (ref 3.5–5.1)
Sodium: 138 mmol/L (ref 135–145)
Total Bilirubin: 0.6 mg/dL (ref 0.3–1.2)
Total Protein: 6.3 g/dL — ABNORMAL LOW (ref 6.5–8.1)

## 2020-11-01 MED ORDER — PREDNISONE 10 MG (21) PO TBPK: ORAL_TABLET | Freq: Every day | ORAL | 0 refills | Status: DC

## 2020-11-01 MED ORDER — OXYCODONE-ACETAMINOPHEN 7.5-325 MG PO TABS: 1.0000 | ORAL_TABLET | Freq: Three times a day (TID) | ORAL | 0 refills | Status: DC | PRN

## 2020-11-01 MED ORDER — MAGIC MOUTHWASH W/LIDOCAINE: 5.0000 mL | Freq: Four times a day (QID) | ORAL | 0 refills | Status: DC

## 2020-11-01 MED ORDER — FAMOTIDINE 20 MG PO TABS: 20.0000 mg | ORAL_TABLET | Freq: Every day | ORAL | 0 refills | Status: DC

## 2020-11-01 MED ORDER — LEVOFLOXACIN 500 MG PO TABS: 500.0000 mg | ORAL_TABLET | Freq: Every day | ORAL | 0 refills | Status: AC

## 2020-11-01 NOTE — Discharge Summary (Addendum)
Gabrielle Dominguez, is a 45 y.o. female  DOB 28-Aug-1975  MRN [id number].  Admission date:  10/28/2020  Admitting Physician  Collier Bullock, MD  Discharge Date:  11/01/2020   Primary MD  Ria Bush, MD  Recommendations for primary care physician for things to follow:  -Please check CBC, CMP during next visit -Follow with  ENT as an outpatient   Admission Diagnosis  Peritonsillar abscess [J36] Tonsillitis [J03.90] Sepsis ([place]) [A41.9] Pharyngitis, unspecified etiology [J02.9] Sepsis, due to unspecified organism, unspecified whether acute organ dysfunction present [facility]) [A41.9]   Discharge Diagnosis  Peritonsillar abscess [J36] Tonsillitis [J03.90] Sepsis ([place]) [A41.9] Pharyngitis, unspecified etiology [J02.9] Sepsis, due to unspecified organism, unspecified whether acute organ dysfunction present [facility]) [A41.9]    Principal Problem:   Sepsis ([place]) Active Problems:   Hypothyroidism   Bipolar 2 disorder ([place])   Hypokalemia   Abscess, peritonsillar   Acute bacterial tonsillitis      Past Medical History:  Diagnosis Date  . Abnormal drug screen 02/2015   inapprop pos oxycodone (02/2015)  . Abnormal Pap smear   . Bipolar 2 disorder ([place])   . History of chicken pox   . History of endometriosis   . History of pneumonia 07/2010, 04/2011  . Hypothyroidism   . IBS (irritable bowel syndrome)   . Panic disorder   . Worsening headaches     Past Surgical History:  Procedure Laterality Date  . ABDOMINAL HYSTERECTOMY    . BILATERAL SALPINGECTOMY N/A 05/10/2014   Procedure: BILATERAL SALPINGECTOMY;  Surgeon: Donnamae Jude, MD;  Location: [facility] [facility];  Service: Gynecology;  Laterality: N/A;  . CESAREAN SECTION  2000  . COLONOSCOPY  10/2012   normal, mult random biopsies obtained, normal [facility])  . LAPAROSCOPIC ASSISTED VAGINAL HYSTERECTOMY N/A 05/10/2014   Procedure: LAPAROSCOPIC ASSISTED VAGINAL  HYSTERECTOMY;  Surgeon: Donnamae Jude, MD;  Location: [facility];  Service: Gynecology;  Laterality: N/A;  . LAPAROSCOPIC ENDOMETRIOSIS FULGURATION  2002 and 2009       History of present illness and  Hospital Course:     Kindly see H&P for history of present illness and admission details, please review complete Labs, Consult reports and Test reports for all details in brief  HPI  from the history and physical done on the day of admission 11/07/2020  HPI: Gabrielle Dominguez is a 45 y.o. female with medical history significant for hypothyroidism, endometriosis and bipolar disorder who presents to the ER via EMS for evaluation of sudden onset of difficulty breathing, sore throat and left-sided neck pain. Her symptoms started about 2 days prior to admission and she was seen in an urgent care center where she was tested for strep and given a dose of Toradol.  She was discharged on prednisone and tramadol without any improvement in her symptoms. On the morning of her admission she woke up with complaints of difficulty breathing and a sensation that her throat was closing.  She called EMS and they were concerned about possible anaphylaxis patient received 2 doses of  0.3 mg IM epi, 50 mg of Benadryl, 125 mg of Solu-Medrol, 20 mg of famotidine, magnesium 2 g, 500 cc normal saline and albuterol. She denies having any fever or chills, no chest pain, no dizziness, no lightheadedness, no drooling, no abdominal pain, no changes in her bowel habits, no urinary symptoms, no headache, no blurred vision, no focal deficits, no cough. Labs show sodium 136, potassium 2.9, chloride 101, bicarb 19, glucose 239, BUN 10, creatinine 0.91, calcium 8.7, alkaline phosphatase 58, albumin 4.0, AST 25, ALT 15, total protein 7.3, lactic acid 5.6 >> 4.2, white count 29.5, hemoglobin 16.2, hematocrit 44, MCV 92.4, RDW 11.7, platelet count 417, PT 13.4, INR 1.0 Group A strep PCR negative Respiratory viral panel is negative Chest x-ray  reviewed by me shows no acute findings CT scan of the neck with contrast shows  prominence of the palatine tonsils bilaterally. Additionally, there is mucosal/submucosal edema of the pharynx, epiglottis and supraglottic larynx. Findings are compatible with pharyngitis/tonsillitis and supraglottitis in the appropriate clinical setting. Additionally, there is a low-density focus with subtle peripheral enhancement measuring 1.5 x 1.4 cm in transaxial dimensions in the region of the left glossotonsillar, suspicious for small tonsillar/peritonsillar abscess. Mild to moderate airway effacement at the level of the supraglottic larynx. Twelve-lead EKG reviewed by me shows sinus tachycardia    ED Course: Patient is a 45 year old female who presents to the ER via EMS for evaluation of difficulty breathing and feeling like her throat was closing up.  She also has pain in the left side of her neck.  She was initially treated for possible anaphylaxis by EMS but in the ER had a CT scan of the neck which showed findings compatible with pharyngitis/tonsillitis and supraglottitis with associated peritonsillar abscess.  Patient was seen in consultation by ENT who recommends IV antibiotics as well as 3 doses of IV Decadron.  No indication for drainage at this time.  Patient is septic and received broad-spectrum antibiotic therapy in the emergency room.  She will be admitted to the hospital for further evaluation and treatment.   Hospital Course   Severe sepsis due Left pharyngeal infection/inflammation - severe sepsis present on admission with elevated lactic acid of 5.6 -ENT consult Appreciated, status post laryngoscopy 4/18, with no obvious abscess in the hypopharynx, significant for watery edema involving the arytenoids and hypopharynx. -Treated with IV clindamycin, and IV Decadron, reports her symptoms have significantly improved, reports she is able to tolerate oral intake/regular diet over last 24 hours,  reports she is feeling some scratchy throat otherwise pain has been controlled. -Patient will be discharged on prednisone taper, and she will be discharged on p.o. antibiotic, given her penicillin allergy, she will be discharged on levofloxacin for 6 days to finish total of 10 days treatment , as trying to avoid clindamycin given high risk for C. difficile .  Hypokalemia -Repleted.   Hypothyroidism  - continue Synthroid   Discharge Condition:  stable   Follow UP   Follow-up Information    Margaretha Sheffield, MD Follow up.   Specialty: Otolaryngology Why: please follow on 11/13/2020 Contact information: [address] [place] [address] [phone]                 Discharge Instructions  and  Discharge Medications    Discharge Instructions    Diet - low sodium heart healthy   Complete by: As directed    Diet - low sodium heart healthy   Complete by: As directed  Discharge instructions   Complete by: As directed    Please make sure that you complete the antibiotics and take the steroids as indicated I would recommend you take a probiotic or eat plain yogurt while you are on the antibiotics We have called in some Percocet for you to ensure that you have pain control going forward  please follow closely with your doctor who will see you in the outpatient setting   Discharge instructions   Complete by: As directed    Follow with Primary MD Ria Bush, MD in 7 days   Get CBC, CMP,  checked  by Primary MD next visit.    Activity: As tolerated with Full fall precautions use walker/cane & assistance as needed   Disposition Home    Diet: Soft diet   On your next visit with your primary care physician please Get Medicines reviewed and adjusted.   Please request your Prim.MD to go over all Hospital Tests and Procedure/Radiological results at the follow up, please get all Hospital records sent to your Prim MD by signing hospital release before  you go home.   If you experience worsening of your admission symptoms, develop shortness of breath, life threatening emergency, suicidal or homicidal thoughts you must seek medical attention immediately by calling 911 or calling your MD immediately  if symptoms less severe.  You Must read complete instructions/literature along with all the possible adverse reactions/side effects for all the Medicines you take and that have been prescribed to you. Take any new Medicines after you have completely understood and accpet all the possible adverse reactions/side effects.   Do not drive, operating heavy machinery, perform activities at heights, swimming or participation in water activities or provide baby sitting services if your were admitted for syncope or siezures until you have seen by Primary MD or a Neurologist and advised to do so again.  Do not drive when taking Pain medications.    Do not take more than prescribed Pain, Sleep and Anxiety Medications  Special Instructions: If you have smoked or chewed Tobacco  in the last 2 yrs please stop smoking, stop any regular Alcohol  and or any Recreational drug use.  Wear Seat belts while driving.   Please note  You were cared for by a hospitalist during your hospital stay. If you have any questions about your discharge medications or the care you received while you were in the hospital after you are discharged, you can call the unit and asked to speak with the hospitalist on call if the hospitalist that took care of you is not available. Once you are discharged, your primary care physician will handle any further medical issues. Please note that NO REFILLS for any discharge medications will be authorized once you are discharged, as it is imperative that you return to your primary care physician (or establish a relationship with a primary care physician if you do not have one) for your aftercare needs so that they can reassess your need for medications  and monitor your lab values.   Increase activity slowly   Complete by: As directed    Increase activity slowly   Complete by: As directed      Allergies as of 11/01/2020      Reactions   Penicillins Hives, Swelling   But can take amoxicillin??      Medication List    STOP taking these medications   traMADol 50 MG tablet Commonly known as: ULTRAM     TAKE these  medications   levofloxacin 500 MG tablet Commonly known as: Levaquin Take 1 tablet (500 mg total) by mouth daily for 10 days.   levothyroxine 88 MCG tablet Commonly known as: Euthyrox Take 1 tablet (88 mcg total) by mouth daily before breakfast.   magic mouthwash w/lidocaine Soln Take 1 mL by mouth 4 (four) times daily.   oxyCODONE-acetaminophen 7.5-325 MG tablet Commonly known as: PERCOCET Take 1 tablet by mouth every 4 (four) hours as needed for severe pain.   predniSONE 10 MG (21) Tbpk tablet Commonly known as: STERAPRED UNI-PAK 21 TAB Take by mouth daily. Take 6 tabs by mouth daily  for 2 days, then 5 tabs for 2 days, then 4 tabs for 2 days, then 3 tabs for 2 days, 2 tabs for 2 days, then 1 tab by mouth daily for 2 days         Diet and Activity recommendation: See Discharge Instructions above   Consults obtained - ENT   Major procedures and Radiology Reports - PLEASE review detailed and final reports for all details, in brief -   Flexible laryngoscopy  DG Chest 2 View  Result Date: 10/28/2020 CLINICAL DATA:  45 year old female who awoke at 0300 hours with difficulty swallowing. Sore throat. Shortness of breath. EXAM: CHEST - 2 VIEW COMPARISON:  Chest radiographs 09/20/2014. FINDINGS: Stable lung volumes. Mediastinal contours remain normal. Visualized tracheal air column is within normal limits. Mild chronic increased pulmonary interstitial markings have not significantly changed. No pneumothorax, pulmonary edema, pleural effusion or confluent pulmonary opacity. No acute osseous abnormality  identified. Negative visible bowel gas pattern. IMPRESSION: No acute cardiopulmonary abnormality. Electronically Signed   By: Genevie Ann M.D.   On: 10/28/2020 09:13   CT Soft Tissue Neck W Contrast  Result Date: 10/28/2020 CLINICAL DATA:  Neck abscess, deep tissue; neck pain. Additional history provided: Difficulty swallowing, sore throat, feeling as though throat is going to "close of" EXAM: CT NECK WITH CONTRAST TECHNIQUE: Multidetector CT imaging of the neck was performed using the standard protocol following the bolus administration of intravenous contrast. CONTRAST:  54mL OMNIPAQUE IOHEXOL 300 MG/ML  SOLN COMPARISON:  Thyroid ultrasound 10/14/2011. FINDINGS: Pharynx and larynx: Prominence of the palatine tonsils bilaterally. Additionally, there is mucosal/submucosal edema of the pharynx, epiglottis and supraglottic larynx. Additionally, there is a low-density focus with subtle peripheral enhancement in the region of the left glossotonsillar sulcus and abutting the left aspect epiglottis measuring 1.5 x 1.4 cm in transaxial dimensions which may reflect a small tonsillar/peritonsillar abscess (series 2, image 48). Surrounding inflammatory stranding extending into the adjacent left neck soft tissues. There is mild to moderate airway effacement at the level of the supraglottic larynx. Salivary glands: No inflammation, mass, or stone. Thyroid: Unremarkable. Lymph nodes: No pathologically enlarged cervical chain lymph nodes. Vascular: The major vascular structures of the neck are patent. Limited intracranial: No evidence of acute intracranial abnormality within the field of view. Visualized orbits: Incompletely imaged. No mass or acute finding at the imaged levels. Mastoids and visualized paranasal sinuses: No significant paranasal sinus disease or mastoid effusion at the imaged levels. Skeleton: Reversal of the expected cervical lordosis. No acute bony abnormality or aggressive osseous lesion. Upper chest: No  consolidation within the imaged lung apices. IMPRESSION: Prominence of the palatine tonsils bilaterally. Additionally, there is mucosal/submucosal edema of the pharynx, epiglottis and supraglottic larynx. Findings are compatible with pharyngitis/tonsillitis and supraglottitis in the appropriate clinical setting. Additionally, there is a low-density focus with subtle peripheral enhancement measuring 1.5 x 1.4  cm in transaxial dimensions in the region of the left glossotonsillar, suspicious for small tonsillar/peritonsillar abscess. Mild to moderate airway effacement at the level of the supraglottic larynx. Electronically Signed   By: Kellie Simmering DO   On: 10/28/2020 10:51    Micro Results   Recent Results (from the past 240 hour(s))  Group A Strep by PCR     Status: None   Collection Time: 10/26/20  7:27 PM   Specimen: Throat; Sterile Swab  Result Value Ref Range Status   Group A Strep by PCR NOT DETECTED NOT DETECTED Final    Comment: Performed at [facility], [address] [place], [place] [address]  Resp Panel by RT-PCR (Flu A&B, Covid) Nasopharyngeal Swab     Status: None   Collection Time: 10/28/20  8:31 AM   Specimen: Nasopharyngeal Swab; Nasopharyngeal(NP) swabs in vial transport medium  Result Value Ref Range Status   SARS Coronavirus 2 by RT PCR NEGATIVE NEGATIVE Final    Comment: (NOTE) SARS-CoV-2 target nucleic acids are NOT DETECTED.  The SARS-CoV-2 RNA is generally detectable in upper respiratory specimens during the acute phase of infection. The lowest concentration of SARS-CoV-2 viral copies this assay can detect is 138 copies/mL. A negative result does not preclude SARS-Cov-2 infection and should not be used as the sole basis for treatment or other patient management decisions. A negative result may occur with  improper specimen collection/handling, submission of specimen other than nasopharyngeal swab, presence of viral mutation(s) within the areas targeted  by this assay, and inadequate number of viral copies(<138 copies/mL). A negative result must be combined with clinical observations, patient history, and epidemiological information. The expected result is Negative.  Fact Sheet for Patients:  EntrepreneurPulse.com.au  Fact Sheet for Healthcare Providers:  IncredibleEmployment.be  This test is no t yet approved or cleared by the [place] FDA and  has been authorized for detection and/or diagnosis of SARS-CoV-2 by FDA under an Emergency Use Authorization (EUA). This EUA will remain  in effect (meaning this test can be used) for the duration of the COVID-19 declaration under Section 564(b)(1) of the Act, 21 U.S.C.section 360bbb-3(b)(1), unless the authorization is terminated  or revoked sooner.       Influenza A by PCR NEGATIVE NEGATIVE Final   Influenza B by PCR NEGATIVE NEGATIVE Final    Comment: (NOTE) The Xpert Xpress SARS-CoV-2/FLU/RSV plus assay is intended as an aid in the diagnosis of influenza from Nasopharyngeal swab specimens and should not be used as a sole basis for treatment. Nasal washings and aspirates are unacceptable for Xpert Xpress SARS-CoV-2/FLU/RSV testing.  Fact Sheet for Patients: EntrepreneurPulse.com.au  Fact Sheet for Healthcare Providers: IncredibleEmployment.be  This test is not yet approved or cleared by the [place] FDA and has been authorized for detection and/or diagnosis of SARS-CoV-2 by FDA under an Emergency Use Authorization (EUA). This EUA will remain in effect (meaning this test can be used) for the duration of the COVID-19 declaration under Section 564(b)(1) of the Act, 21 U.S.C. section 360bbb-3(b)(1), unless the authorization is terminated or revoked.  Performed at [facility], [place], [place] [address]   Group A Strep by PCR [facility] Only)     Status: None   Collection Time:  10/28/20  9:31 AM   Specimen: Throat; Sterile Swab  Result Value Ref Range Status   Group A Strep by PCR NOT DETECTED NOT DETECTED Final    Comment: Performed at [facility], [place]  [address] [place], [place] [address]  Blood culture (routine x 2)     Status: None (Preliminary result)   Collection Time: 10/28/20  9:31 AM   Specimen: BLOOD  Result Value Ref Range Status   Specimen Description BLOOD BLOOD RIGHT HAND  Final   Special Requests   Final    BOTTLES DRAWN AEROBIC AND ANAEROBIC Blood Culture results may not be optimal due to an inadequate volume of blood received in culture bottles   Culture   Final    NO GROWTH 4 DAYS Performed at [facility], [address] [place], [place] [address]    Report Status PENDING  Incomplete  Blood culture (routine x 2)     Status: None (Preliminary result)   Collection Time: 10/28/20  9:31 AM   Specimen: BLOOD  Result Value Ref Range Status   Specimen Description BLOOD LEFT ANTECUBITAL  Final   Special Requests   Final    BOTTLES DRAWN AEROBIC AND ANAEROBIC Blood Culture adequate volume   Culture   Final    NO GROWTH 4 DAYS Performed at [facility], [address], [place], [place] [address]    Report Status PENDING  Incomplete  MRSA PCR Screening     Status: None   Collection Time: 10/28/20  1:30 PM   Specimen: Nasal Mucosa; Nasopharyngeal  Result Value Ref Range Status   MRSA by PCR NEGATIVE NEGATIVE Final    Comment:        The GeneXpert MRSA Assay (FDA approved for NASAL specimens only), is one component of a comprehensive MRSA colonization surveillance program. It is not intended to diagnose MRSA infection nor to guide or monitor treatment for MRSA infections. Performed at [facility], [address] [place], [place] [address]        Today   Subjective:   Gabrielle Dominguez today reports throat has significantly improved, able to tolerate regular diet, reports pain significantly  subsided, denies any increased oral secretions .  Objective:   Blood pressure 116/76, pulse 65, temperature 97.8 F (36.6 C), resp. rate 18, height 5\' 4"  (1.626 m), weight 85.9 kg, last menstrual period 09/12/2011, SpO2 100 %.   Intake/Output Summary (Last 24 hours) at 11/01/2020 1120 Last data filed at 11/01/2020 1008 Gross per 24 hour  Intake 1469.83 ml  Output --  Net 1469.83 ml    Exam Awake Alert, Oriented x 3, No new F.N deficits, Normal affect No swelling or induration could be appreciated in the upper neck/jaw area Symmetrical Chest wall movement, Good air movement bilaterally, CTAB RRR,No Gallops,Rubs or new Murmurs, No Parasternal Heave +ve B.Sounds, Abd Soft, Non tender, No organomegaly appriciated, No rebound -guarding or rigidity. No Cyanosis, Clubbing or edema, No new Rash or bruise  Data Review   CBC w Diff:  Lab Results  Component Value Date   WBC 12.1 (H) 11/01/2020   HGB 14.4 11/01/2020   HGB 15.1 03/13/2013   HCT 41.8 11/01/2020   HCT 43.1 03/13/2013   PLT 350 11/01/2020   PLT 307 03/13/2013   LYMPHOPCT 10 11/01/2020   MONOPCT 3 11/01/2020   EOSPCT 0 11/01/2020   BASOPCT 0 11/01/2020    CMP:  Lab Results  Component Value Date   NA 138 11/01/2020   NA 140 03/13/2013   K 3.7 11/01/2020   K 3.7 03/13/2013   CL 104 11/01/2020   CL 107 03/13/2013   CO2 27 11/01/2020   CO2 26 03/13/2013   BUN 8 11/01/2020  BUN 9 03/13/2013   CREATININE 0.65 11/01/2020   CREATININE 0.80 03/13/2013   PROT 6.3 (L) 11/01/2020   ALBUMIN 3.2 (L) 11/01/2020   BILITOT 0.6 11/01/2020   ALKPHOS 44 11/01/2020   AST 16 11/01/2020   ALT 18 11/01/2020  .   Total Time in preparing paper work, data evaluation and todays exam - 9 minutes  Phillips Climes M.D on 11/01/2020 at 11:20 AM  [facility]   Office  [phone]

## 2020-11-01 NOTE — Progress Notes (Signed)
IV removed before discharge. Went over discharge instructions and medications with patient. Patient stated that she understood and all questions answered. Gave patient discharge paper work and prescriptions. Patient going home with her mother POV.

## 2020-11-01 NOTE — Discharge Instructions (Signed)
Follow with Primary MD Ria Bush, MD in 7 days   Get CBC, CMP,  checked  by Primary MD next visit.    Activity: As tolerated with Full fall precautions use walker/cane & assistance as needed   Disposition Home    Diet: Soft diet   On your next visit with your primary care physician please Get Medicines reviewed and adjusted.   Please request your Prim.MD to go over all Hospital Tests and Procedure/Radiological results at the follow up, please get all Hospital records sent to your Prim MD by signing hospital release before you go home.   If you experience worsening of your admission symptoms, develop shortness of breath, life threatening emergency, suicidal or homicidal thoughts you must seek medical attention immediately by calling 911 or calling your MD immediately  if symptoms less severe.  You Must read complete instructions/literature along with all the possible adverse reactions/side effects for all the Medicines you take and that have been prescribed to you. Take any new Medicines after you have completely understood and accpet all the possible adverse reactions/side effects.   Do not drive, operating heavy machinery, perform activities at heights, swimming or participation in water activities or provide baby sitting services if your were admitted for syncope or siezures until you have seen by Primary MD or a Neurologist and advised to do so again.  Do not drive when taking Pain medications.    Do not take more than prescribed Pain, Sleep and Anxiety Medications  Special Instructions: If you have smoked or chewed Tobacco  in the last 2 yrs please stop smoking, stop any regular Alcohol  and or any Recreational drug use.  Wear Seat belts while driving.   Please note  You were cared for by a hospitalist during your hospital stay. If you have any questions about your discharge medications or the care you received while you were in the hospital after you are discharged,  you can call the unit and asked to speak with the hospitalist on call if the hospitalist that took care of you is not available. Once you are discharged, your primary care physician will handle any further medical issues. Please note that NO REFILLS for any discharge medications will be authorized once you are discharged, as it is imperative that you return to your primary care physician (or establish a relationship with a primary care physician if you do not have one) for your aftercare needs so that they can reassess your need for medications and monitor your lab values.

## 2020-11-02 LAB — CULTURE, BLOOD (ROUTINE X 2)
Culture: NO GROWTH
Culture: NO GROWTH
Special Requests: ADEQUATE

## 2020-12-12 ENCOUNTER — Telehealth: Payer: Self-pay | Admitting: *Deleted

## 2020-12-12 NOTE — Telephone Encounter (Signed)
Plz see if pt can come in tomorrow at 12:30.

## 2020-12-12 NOTE — Telephone Encounter (Signed)
Patient called stating that she was in the hospital last month with an abscess. Patient stated that she was on high doses of Prednisone and clindamycin. Patient stated while she was in the hospital she noticed an odor in her urine and under her arms. Patient stated that she still has the odor which comes and goes. Patient stated that she has already had her hospital follow-up with her ENT. Patient stated that she has been cleared by the ENT.  Patient stated that .she was told if she has any further problems to see her PCP. Patient wants to know if she should see her PCP or what would be recommended. You have no appointments available this week. Please advised if you would like to work her in.

## 2020-12-12 NOTE — Telephone Encounter (Signed)
Glad abscess is better.  Do recommend OV - may place at 12:30pm slot. Or alternatively I currently have 4pm slot open - may offer that one

## 2020-12-13 ENCOUNTER — Encounter: Payer: Self-pay | Admitting: Family Medicine

## 2020-12-13 ENCOUNTER — Ambulatory Visit (INDEPENDENT_AMBULATORY_CARE_PROVIDER_SITE_OTHER): Payer: 59 | Admitting: Family Medicine

## 2020-12-13 ENCOUNTER — Other Ambulatory Visit: Payer: Self-pay

## 2020-12-13 VITALS — BP 112/76 | HR 87 | Temp 97.5°F | Ht 64.0 in | Wt 190.2 lb

## 2020-12-13 DIAGNOSIS — R8290 Unspecified abnormal findings in urine: Secondary | ICD-10-CM | POA: Diagnosis not present

## 2020-12-13 DIAGNOSIS — R5383 Other fatigue: Secondary | ICD-10-CM | POA: Diagnosis not present

## 2020-12-13 DIAGNOSIS — R319 Hematuria, unspecified: Secondary | ICD-10-CM

## 2020-12-13 DIAGNOSIS — R431 Parosmia: Secondary | ICD-10-CM | POA: Insufficient documentation

## 2020-12-13 DIAGNOSIS — R439 Unspecified disturbances of smell and taste: Secondary | ICD-10-CM | POA: Diagnosis not present

## 2020-12-13 DIAGNOSIS — J36 Peritonsillar abscess: Secondary | ICD-10-CM | POA: Diagnosis not present

## 2020-12-13 LAB — CBC WITH DIFFERENTIAL/PLATELET
Basophils Absolute: 0.1 10*3/uL (ref 0.0–0.1)
Basophils Relative: 0.9 % (ref 0.0–3.0)
Eosinophils Absolute: 0.1 10*3/uL (ref 0.0–0.7)
Eosinophils Relative: 1.2 % (ref 0.0–5.0)
HCT: 43.6 % (ref 36.0–46.0)
Hemoglobin: 15 g/dL (ref 12.0–15.0)
Lymphocytes Relative: 25 % (ref 12.0–46.0)
Lymphs Abs: 2.2 10*3/uL (ref 0.7–4.0)
MCHC: 34.5 g/dL (ref 30.0–36.0)
MCV: 97.1 fl (ref 78.0–100.0)
Monocytes Absolute: 0.6 10*3/uL (ref 0.1–1.0)
Monocytes Relative: 6.9 % (ref 3.0–12.0)
Neutro Abs: 5.9 10*3/uL (ref 1.4–7.7)
Neutrophils Relative %: 66 % (ref 43.0–77.0)
Platelets: 320 10*3/uL (ref 150.0–400.0)
RBC: 4.49 Mil/uL (ref 3.87–5.11)
RDW: 12.4 % (ref 11.5–15.5)
WBC: 8.9 10*3/uL (ref 4.0–10.5)

## 2020-12-13 LAB — POC URINALSYSI DIPSTICK (AUTOMATED)
Bilirubin, UA: NEGATIVE
Glucose, UA: NEGATIVE
Ketones, UA: NEGATIVE
Leukocytes, UA: NEGATIVE
Nitrite, UA: NEGATIVE
Protein, UA: NEGATIVE
Spec Grav, UA: >=1.03 — AB (ref 1.010–1.025)
Urobilinogen, UA: 0.2 E.U./dL
pH, UA: 6 (ref 5.0–8.0)

## 2020-12-13 LAB — COMPREHENSIVE METABOLIC PANEL
ALT: 12 U/L (ref 0–35)
AST: 14 U/L (ref 0–37)
Albumin: 3.8 g/dL (ref 3.5–5.2)
Alkaline Phosphatase: 59 U/L (ref 39–117)
BUN: 13 mg/dL (ref 6–23)
CO2: 27 mEq/L (ref 19–32)
Calcium: 8.7 mg/dL (ref 8.4–10.5)
Chloride: 102 mEq/L (ref 96–112)
Creatinine, Ser: 0.78 mg/dL (ref 0.40–1.20)
GFR: 91.54 mL/min (ref >60.00)
Glucose, Bld: 90 mg/dL (ref 70–99)
Potassium: 3.7 mEq/L (ref 3.5–5.1)
Sodium: 138 mEq/L (ref 135–145)
Total Bilirubin: 0.4 mg/dL (ref 0.2–1.2)
Total Protein: 6.3 g/dL (ref 6.0–8.3)

## 2020-12-13 LAB — URINALYSIS, MICROSCOPIC ONLY: RBC / HPF: NONE SEEN (ref 0–?)

## 2020-12-13 LAB — VITAMIN B12: Vitamin B-12: 467 pg/mL (ref 211–911)

## 2020-12-13 LAB — VITAMIN D 25 HYDROXY (VIT D DEFICIENCY, FRACTURES): VITD: 30.03 ng/mL (ref 30.00–100.00)

## 2020-12-13 NOTE — Assessment & Plan Note (Signed)
Denies h/o this - send for microscopy.

## 2020-12-13 NOTE — Assessment & Plan Note (Signed)
Completed oral levaquin and prednisone course.  Has seen ENT in f/u.  This has resolved.

## 2020-12-13 NOTE — Patient Instructions (Signed)
Possible med related changes. If so, this should get better with time. Labs today. Urine overall ok - I have sent off test to look for blood in urine.

## 2020-12-13 NOTE — Progress Notes (Signed)
Patient ID: Gabrielle Dominguez, female    DOB: [birth year], 45 y.o.   MRN: [id number]  This visit was conducted in person.  BP 112/76   Pulse 87   Temp (!) 97.5 F (36.4 C) (Temporal)   Ht 5\' 4"  (1.626 m)   Wt 190 lb 4 oz (86.3 kg)   LMP 09/12/2011   SpO2 98%   BMI 32.66 kg/m    CC: body odor Subjective:   HPI: Gabrielle Dominguez is a 45 y.o. female presenting on 12/13/2020 for Body Odor (C/o strange musty smell in axilla and in urine.  Not sure if due to meds.  Sxs started about 1 mo ago during hospital stay. )   Recent hospital records reviewed.  Recent hospitalization for pharyngitis, tonsillitis, and L peritonsillar abscess with sepsis s/p treatment with IV decadron and IV clindamycin. ENT evaluated s/p laryngoscopy. Discharged on oral levaquin and prednisone taper. blcx x2 no growth final.   Saw Dr Kathyrn Sheriff ENT in f/u - cleared from abscess/pharyngitis.   Notes 1 mo h/o musty smell underarms and in urine that started while in the hospital.  This is despite showering twice daily.  She changed her deodorant to a stronger one ([employer] Secret).  No one else smells this.  Notes increased fatigue.  No fevers/chills, no skin changes, rashes, axillary rash. No changes to sense of taste. No COVID symptoms.   LMP - s/p hysterectomy      Relevant past medical, surgical, family and social history reviewed and updated as indicated. Interim medical history since our last visit reviewed. Allergies and medications reviewed and updated. Outpatient Medications Prior to Visit  Medication Sig Dispense Refill  . levothyroxine (EUTHYROX) 88 MCG tablet Take 1 tablet (88 mcg total) by mouth daily before breakfast. 90 tablet 3  . famotidine (PEPCID) 20 MG tablet Take 1 tablet (20 mg total) by mouth daily for 10 days. 10 tablet 0  . magic mouthwash w/lidocaine SOLN Take 5 mLs by mouth 4 (four) times daily. 140 mL 0  . oxyCODONE-acetaminophen (PERCOCET) 7.5-325 MG tablet Take 1 tablet by mouth every 8  (eight) hours as needed for severe pain. 15 tablet 0  . predniSONE (STERAPRED UNI-PAK 21 TAB) 10 MG (21) TBPK tablet Take by mouth daily. Take 6 tabs by mouth daily  for 2 days, then 5 tabs for 2 days, then 4 tabs for 2 days, then 3 tabs for 2 days, 2 tabs for 2 days, then 1 tab by mouth daily for 2 days 42 tablet 0   No facility-administered medications prior to visit.     Per HPI unless specifically indicated in ROS section below Review of Systems Objective:  BP 112/76   Pulse 87   Temp (!) 97.5 F (36.4 C) (Temporal)   Ht 5\' 4"  (1.626 m)   Wt 190 lb 4 oz (86.3 kg)   LMP 09/12/2011   SpO2 98%   BMI 32.66 kg/m   Wt Readings from Last 3 Encounters:  12/13/20 190 lb 4 oz (86.3 kg)  10/28/20 189 lb 6 oz (85.9 kg)  10/26/20 176 lb (79.8 kg)      Physical Exam Vitals and nursing note reviewed.  Constitutional:      Appearance: Normal appearance. She is not ill-appearing.  HENT:     Head: Normocephalic and atraumatic.     Mouth/Throat:     Mouth: Mucous membranes are moist.     Pharynx: Oropharynx is clear. No oropharyngeal exudate or posterior oropharyngeal erythema.  Eyes:     Extraocular Movements: Extraocular movements intact.     Pupils: Pupils are equal, round, and reactive to light.  Neck:     Thyroid: No thyroid mass or thyromegaly.  Cardiovascular:     Rate and Rhythm: Normal rate and regular rhythm.     Pulses: Normal pulses.     Heart sounds: Normal heart sounds. No murmur heard.   Pulmonary:     Effort: Pulmonary effort is normal. No respiratory distress.     Breath sounds: Normal breath sounds. No wheezing, rhonchi or rales.  Musculoskeletal:        General: Normal range of motion.     Cervical back: Normal range of motion and neck supple.     Right lower leg: No edema.     Left lower leg: No edema.  Lymphadenopathy:     Cervical: No cervical adenopathy.  Skin:    General: Skin is warm and dry.     Findings: No rash.     Comments: No skin changes to  axilla  Neurological:     Mental Status: She is alert.  Psychiatric:        Mood and Affect: Mood normal.        Behavior: Behavior normal.       Results for orders placed or performed in visit on 12/13/20  POCT Urinalysis Dipstick (Automated)  Result Value Ref Range   Color, UA yellow    Clarity, UA clear    Glucose, UA Negative Negative   Bilirubin, UA negative    Ketones, UA negative    Spec Grav, UA >=1.030 (A) 1.010 - 1.025   Blood, UA 2+    pH, UA 6.0 5.0 - 8.0   Protein, UA Negative Negative   Urobilinogen, UA 0.2 0.2 or 1.0 E.U./dL   Nitrite, UA negative    Leukocytes, UA Negative Negative   Assessment & Plan:  This visit occurred during the SARS-CoV-2 public health emergency.  Safety protocols were in place, including screening questions prior to the visit, additional usage of staff PPE, and extensive cleaning of exam room while observing appropriate contact time as indicated for disinfecting solutions.   Problem List Items Addressed This Visit    HEMATURIA UNSPECIFIED    Denies h/o this - send for microscopy.       Abnormal smell - Primary    Notes musky smell underarms and to urine.  Normal exam today.  ?side effect of recent medications to treat peritonsillar abscess (was on decadron, clindamycin, prednisone and levaquin). Anticipate should resolve on its own.  Check labwork.      RESOLVED: Abscess, peritonsillar    Completed oral levaquin and prednisone course.  Has seen ENT in f/u.  This has resolved.       Relevant Orders   CBC with Differential/Platelet   Comprehensive metabolic panel    Other Visit Diagnoses    Foul smelling urine       Relevant Orders   POCT Urinalysis Dipstick (Automated) (Completed)   Urinalysis, microscopic only   Fatigue, unspecified type       Relevant Orders   Vitamin B12   VITAMIN D 25 Hydroxy (Vit-D Deficiency, Fractures)       No orders of the defined types were placed in this encounter.  Orders Placed This  Encounter  Procedures  . Vitamin B12  . VITAMIN D 25 Hydroxy (Vit-D Deficiency, Fractures)  . CBC with Differential/Platelet  . Comprehensive metabolic panel  . Urinalysis, microscopic  only  . POCT Urinalysis Dipstick (Automated)    Patient Instructions  Possible med related changes. If so, this should get better with time. Labs today. Urine overall ok - I have sent off test to look for blood in urine.    Follow up plan: Return if symptoms worsen or fail to improve.  Ria Bush, MD

## 2020-12-13 NOTE — Assessment & Plan Note (Signed)
Notes musky smell underarms and to urine.  Normal exam today.  ?side effect of recent medications to treat peritonsillar abscess (was on decadron, clindamycin, prednisone and levaquin). Anticipate should resolve on its own.  Check labwork.

## 2020-12-14 ENCOUNTER — Telehealth: Payer: Self-pay

## 2020-12-14 NOTE — Telephone Encounter (Signed)
Pt viewed MyChart message today at 1:33 PM.

## 2020-12-14 NOTE — Telephone Encounter (Signed)
Released via mychart:  All your labs returned reassuringly ok.  Your urine didn't have blood.  Your vitamin D levels were borderline low - you would benefit from taking 1000 units vitamin D daily.

## 2020-12-14 NOTE — Telephone Encounter (Signed)
Pt left v/m requesting cb with lab results from 12/13/20.

## 2021-01-10 ENCOUNTER — Other Ambulatory Visit: Payer: Self-pay | Admitting: Family Medicine

## 2021-01-10 DIAGNOSIS — Z1231 Encounter for screening mammogram for malignant neoplasm of breast: Secondary | ICD-10-CM

## 2021-02-12 ENCOUNTER — Ambulatory Visit: Payer: 59

## 2021-02-27 DIAGNOSIS — B009 Herpesviral infection, unspecified: Secondary | ICD-10-CM | POA: Insufficient documentation

## 2021-03-12 ENCOUNTER — Ambulatory Visit: Payer: 59

## 2021-03-26 ENCOUNTER — Ambulatory Visit
Admission: RE | Admit: 2021-03-26 | Discharge: 2021-03-26 | Disposition: A | Discharge status: Home / Self Care | Payer: 59 | Source: Ambulatory Visit | Attending: Family Medicine | Admitting: Family Medicine

## 2021-03-26 ENCOUNTER — Other Ambulatory Visit: Payer: Self-pay

## 2021-03-26 DIAGNOSIS — Z1231 Encounter for screening mammogram for malignant neoplasm of breast: Secondary | ICD-10-CM | POA: Diagnosis present

## 2021-05-14 ENCOUNTER — Telehealth: Payer: Self-pay | Admitting: Family Medicine

## 2021-05-14 NOTE — Telephone Encounter (Signed)
Pharmacy called Mickel Baas) regarding prescription change levothyroxine. Pharmacy wants a call back

## 2021-05-14 NOTE — Telephone Encounter (Signed)
Tried calling the patient and he did not answer. LVM for patient to call back.  Called the pharmacy and approved the change from Euthyrox to generic.

## 2021-05-15 NOTE — Telephone Encounter (Addendum)
Per Dr. Darnell Level, pt will need 2 mo lab visit due to change in thyroid med manufacturer.  Lvm asking pt to call back. Need to make pt aware of med change and schedule 2 mo lab visit (non-fasting).

## 2021-05-16 NOTE — Telephone Encounter (Addendum)
Per Dr. Darnell Level, pt will need 2 mo lab visit due to change in thyroid med manufacturer.  Lvm asking pt to call back. Need to make pt aware of med change and schedule 2 mo lab visit (non-fasting).

## 2021-05-21 NOTE — Telephone Encounter (Addendum)
Per Dr. Darnell Level, pt will need 2 mo lab visit due to change in thyroid med manufacturer.  Lvm asking pt to call back. Need to make pt aware of med change and schedule 2 mo lab visit (non-fasting).

## 2021-05-23 NOTE — Telephone Encounter (Signed)
Spoke with [facility], informing them Dr. Darnell Level is agreeing to manufacturer change.  They documented in pt's info.

## 2021-05-23 NOTE — Telephone Encounter (Addendum)
Per Dr. Darnell Level, pt will need 2 mo lab visit due to change in thyroid med manufacturer.  Lvm asking pt to call back. Need to make pt aware of med change and schedule 2 mo lab visit (non-fasting).   Also, notified pt via [place].

## 2021-06-12 NOTE — Telephone Encounter (Signed)
[place] Night - Client Nonclinical Telephone Record  [facility] Client [facility] Night - Client Client Site [place] Provider Ria Bush - MD Contact Type Call Who Is Calling Patient / Member / Family / Caregiver Caller Name [place] Phone Number [phone] Call Type Message Only Information Provided Reason for Call Returning a Call from the Office Initial [place] states she received a call from the office come in and have labs done for her thyroid check. Disp. Time Disposition Final User 06/12/2021 8:12:27 AM General Information Provided Yes Ignacia Felling Call Closed By: Ignacia Felling Transaction Date/Time: 06/12/2021 8:09:31 AM (ET

## 2021-06-12 NOTE — Telephone Encounter (Signed)
LMTCB to schedule lab appt.

## 2021-08-21 ENCOUNTER — Other Ambulatory Visit: Payer: Self-pay

## 2021-08-21 ENCOUNTER — Encounter: Payer: Self-pay | Admitting: Obstetrics and Gynecology

## 2021-08-21 ENCOUNTER — Ambulatory Visit (INDEPENDENT_AMBULATORY_CARE_PROVIDER_SITE_OTHER): Payer: 59 | Admitting: Obstetrics and Gynecology

## 2021-08-21 ENCOUNTER — Other Ambulatory Visit (HOSPITAL_COMMUNITY)
Admission: RE | Admit: 2021-08-21 | Discharge: 2021-08-21 | Disposition: A | Discharge status: Home / Self Care | Payer: 59 | Source: Ambulatory Visit | Attending: Obstetrics and Gynecology | Admitting: Obstetrics and Gynecology

## 2021-08-21 VITALS — BP 132/78 | HR 76 | Wt 192.0 lb

## 2021-08-21 DIAGNOSIS — N750 Cyst of Bartholin's gland: Secondary | ICD-10-CM | POA: Insufficient documentation

## 2021-08-21 MED ORDER — METRONIDAZOLE 500 MG PO TABS: 500.0000 mg | ORAL_TABLET | Freq: Two times a day (BID) | ORAL | 0 refills | Status: AC

## 2021-08-21 MED ORDER — DOXYCYCLINE HYCLATE 100 MG PO CAPS: 100.0000 mg | ORAL_CAPSULE | Freq: Two times a day (BID) | ORAL | 0 refills | Status: DC

## 2021-08-21 NOTE — Progress Notes (Signed)
Noticed a knot that appeared on  left vaginal wall, fels more under the skin

## 2021-08-21 NOTE — Progress Notes (Signed)
Obstetrics and Gynecology Visit Return Patient Evaluation  Appointment Date: 08/21/2021  Primary Care Provider: Gutierrez, [place] for [facility]  Chief Complaint: left sided vulvar pain x 1d  History of Present Illness:  Gabrielle Dominguez is a 46 y.o. with above CC. No prior s/s. Pt has h/o LAVH/BS. No vaginal discharge, vaginitis  Review of Systems: as noted in the History of Present Illness.  Patient Active Problem List   Diagnosis Date Noted   Abnormal smell 12/13/2020   Health maintenance examination 08/27/2019   Anxiety state 11/13/2018   Adjustment disorder with depressed mood 03/24/2017   Bipolar 2 disorder ([place]) 04/15/2014   Lymphedema of left lower extremity 03/29/2013   Obesity, Class I, BMI 30.0-34.9 (see actual BMI) 08/20/2012   Panic disorder 10/10/2011   Hypothyroidism    History of endometriosis 03/26/2011   HEMATURIA UNSPECIFIED 02/04/2010   Medications:  Gabrielle Dominguez had no medications administered during this visit. Current Outpatient Medications  Medication Sig Dispense Refill   FLUoxetine (PROZAC) 10 MG tablet Take 10 mg by mouth daily.     levothyroxine (EUTHYROX) 88 MCG tablet Take 1 tablet (88 mcg total) by mouth daily before breakfast. 90 tablet 3   No current facility-administered medications for this visit.    Allergies: is allergic to penicillins.  Physical Exam:  BP 132/78    Pulse 76    Wt 192 lb (87.1 kg)    LMP 09/12/2011    BMI 32.96 kg/m  Body mass index is 32.96 kg/m. General appearance: Well nourished, well developed female in no acute distress.  Neuro/Psych:  Normal mood and affect.    Pelvic exam:  EGBUS: left side with 2-3cm bartholin's, mobile, minimaly ttp, normal appearing skin and no inguinal LAD. right side normal and no inguinal LAD  Assessment: pt stable  Plan:  1. Bartholin gland cyst Abx vs I&d and word offered to her today as she doesn't have an abscess currently. Pt elects  the former. RTC early next week for f/u. ED precautions given. Doxy/flagyl sent in - Cervicovaginal ancillary only( [place])   RTC: 5 days  Durene Romans MD Attending [facility] for [facility])

## 2021-08-23 ENCOUNTER — Telehealth: Payer: Self-pay | Admitting: Obstetrics and Gynecology

## 2021-08-23 LAB — CERVICOVAGINAL ANCILLARY ONLY
Chlamydia: NEGATIVE
Comment: NEGATIVE
Comment: NEGATIVE
Comment: NORMAL
Neisseria Gonorrhea: NEGATIVE
Trichomonas: NEGATIVE

## 2021-08-23 NOTE — Telephone Encounter (Signed)
Patient states that she isn't having any bleeding just feels like the cyst has gotten bigger.

## 2021-08-23 NOTE — Telephone Encounter (Signed)
Was seen Tuesday and is now having pains when walking/sitting. Is starting to have chills. Is on ABX. Pt would like to know what to do or if she is needing to be seen.   CB# [phone]

## 2021-08-24 ENCOUNTER — Ambulatory Visit: Payer: 59 | Admitting: Obstetrics and Gynecology

## 2021-08-27 ENCOUNTER — Ambulatory Visit (INDEPENDENT_AMBULATORY_CARE_PROVIDER_SITE_OTHER): Payer: 59 | Admitting: Family Medicine

## 2021-08-27 ENCOUNTER — Other Ambulatory Visit: Payer: Self-pay

## 2021-08-27 ENCOUNTER — Encounter: Payer: Self-pay | Admitting: Family Medicine

## 2021-08-27 VITALS — BP 124/81 | HR 74

## 2021-08-27 DIAGNOSIS — N750 Cyst of Bartholin's gland: Secondary | ICD-10-CM | POA: Diagnosis not present

## 2021-08-27 NOTE — Progress Notes (Signed)
° °  GYNECOLOGY PROBLEM  VISIT ENCOUNTER NOTE  Subjective:   Gabrielle Dominguez is a 46 y.o. [id number] female here for a problem GYN visit.  Current complaints: swelling and pain on left vaginal area, had bartholin gland cyst that was seen by Dr. Ilda Basset who offered I&D but patient opted for abx and warm compresses. Reports a couple days ago it "burst" and reports continued swelling and "not feeling well." Has abx through wed 2/15.    Denies abnormal vaginal bleeding, discharge, pelvic pain, problems with intercourse or other gynecologic concerns.    Gynecologic History Patient's last menstrual period was 09/12/2011.  Contraception: status post hysterectomy  Health Maintenance Due  Topic Date Due   COVID-19 Vaccine (1) Never done   INFLUENZA VACCINE  02/12/2021    The following portions of the patient's history were reviewed and updated as appropriate: allergies, current medications, past family history, past medical history, past social history, past surgical history and problem list.  Review of Systems Pertinent items are noted in HPI.   Objective:  BP 124/81    Pulse 74    LMP 09/12/2011  Gen: well appearing, NAD HEENT: no scleral icterus CV: RR Lung: Normal WOB Ext: warm well perfused  PELVIC: Normal appearing external genitalia; normal appearing vaginal mucosa and cervix.  No abnormal discharge noted.  Surgically absent uterus, no other palpable masses, no uterine or adnexal tenderness. 1 cm fluctuant area on the left vaginal wall. Not overly TTP.   Obtained verbal and written consent  Bartholin Cyst I&D  Enlarged abscess palpated in front of the hymenal ring around 5 or 7 o' clock.  Written informed consent was obtained.  Discussed complications and possible outcomes of procedure including recurrence of cyst, scarring leading to infecton, bleeding, dyspareunia, distortion of anatomy.  Patient was examined in the dorsal lithotomy position and mass was identified.  The area was  prepped with Iodine and draped in a sterile manner. 1% Lidocaine (3 ml) was then used to infiltrate area on top of the cyst, behind the hymenal ring.  A 7 mm incision was made using a sterile scapel. Upon palpation of the mass, minimal amount of bloody purulent drainage was expressed through the incision. A hemostat was used to break up loculations, which resulted in no purulent discharge.  Did not place Word Catheter given lack drainage and minimal cavity for placement.  Patient tolerated the procedure well, reported feeling " better."  Assessment and Plan:   1. Bartholin gland cyst - Recommended Sitz baths bid and Motrin prn pain.   She was told to call to be examined if she experiences increasing swelling, pain, vaginal discharge, or fever.  - She was instructed to wear a peripad to absorb discharge, and to maintain pelvic rest for 1 week.  - Continue previously prescribed abx  Please refer to After Visit Summary for other counseling recommendations.   Return in about 4 weeks (around 09/24/2021), or if symptoms worsen or fail to improve.  Caren Macadam, MD, MPH, ABFM Attending [place] for [facility]

## 2021-08-27 NOTE — Progress Notes (Signed)
Follow up on bartholins, did start draining on Friday

## 2021-09-24 ENCOUNTER — Ambulatory Visit: Payer: 59 | Admitting: Family Medicine

## 2021-10-15 IMAGING — CR DG CHEST 2V
1 series · 2 of 2 positions shown · non-contrast
Comparison: Chest radiographs 09/20/2014.

CLINICAL DATA: 44-year-old female who awoke at 6466 hours with
difficulty swallowing. Sore throat. Shortness of breath.

EXAM:
CHEST - 2 VIEW

[Series 1: dg chest 2 view · 0.14mm/px · 2 of 2 slices shown]
[im 1/2]
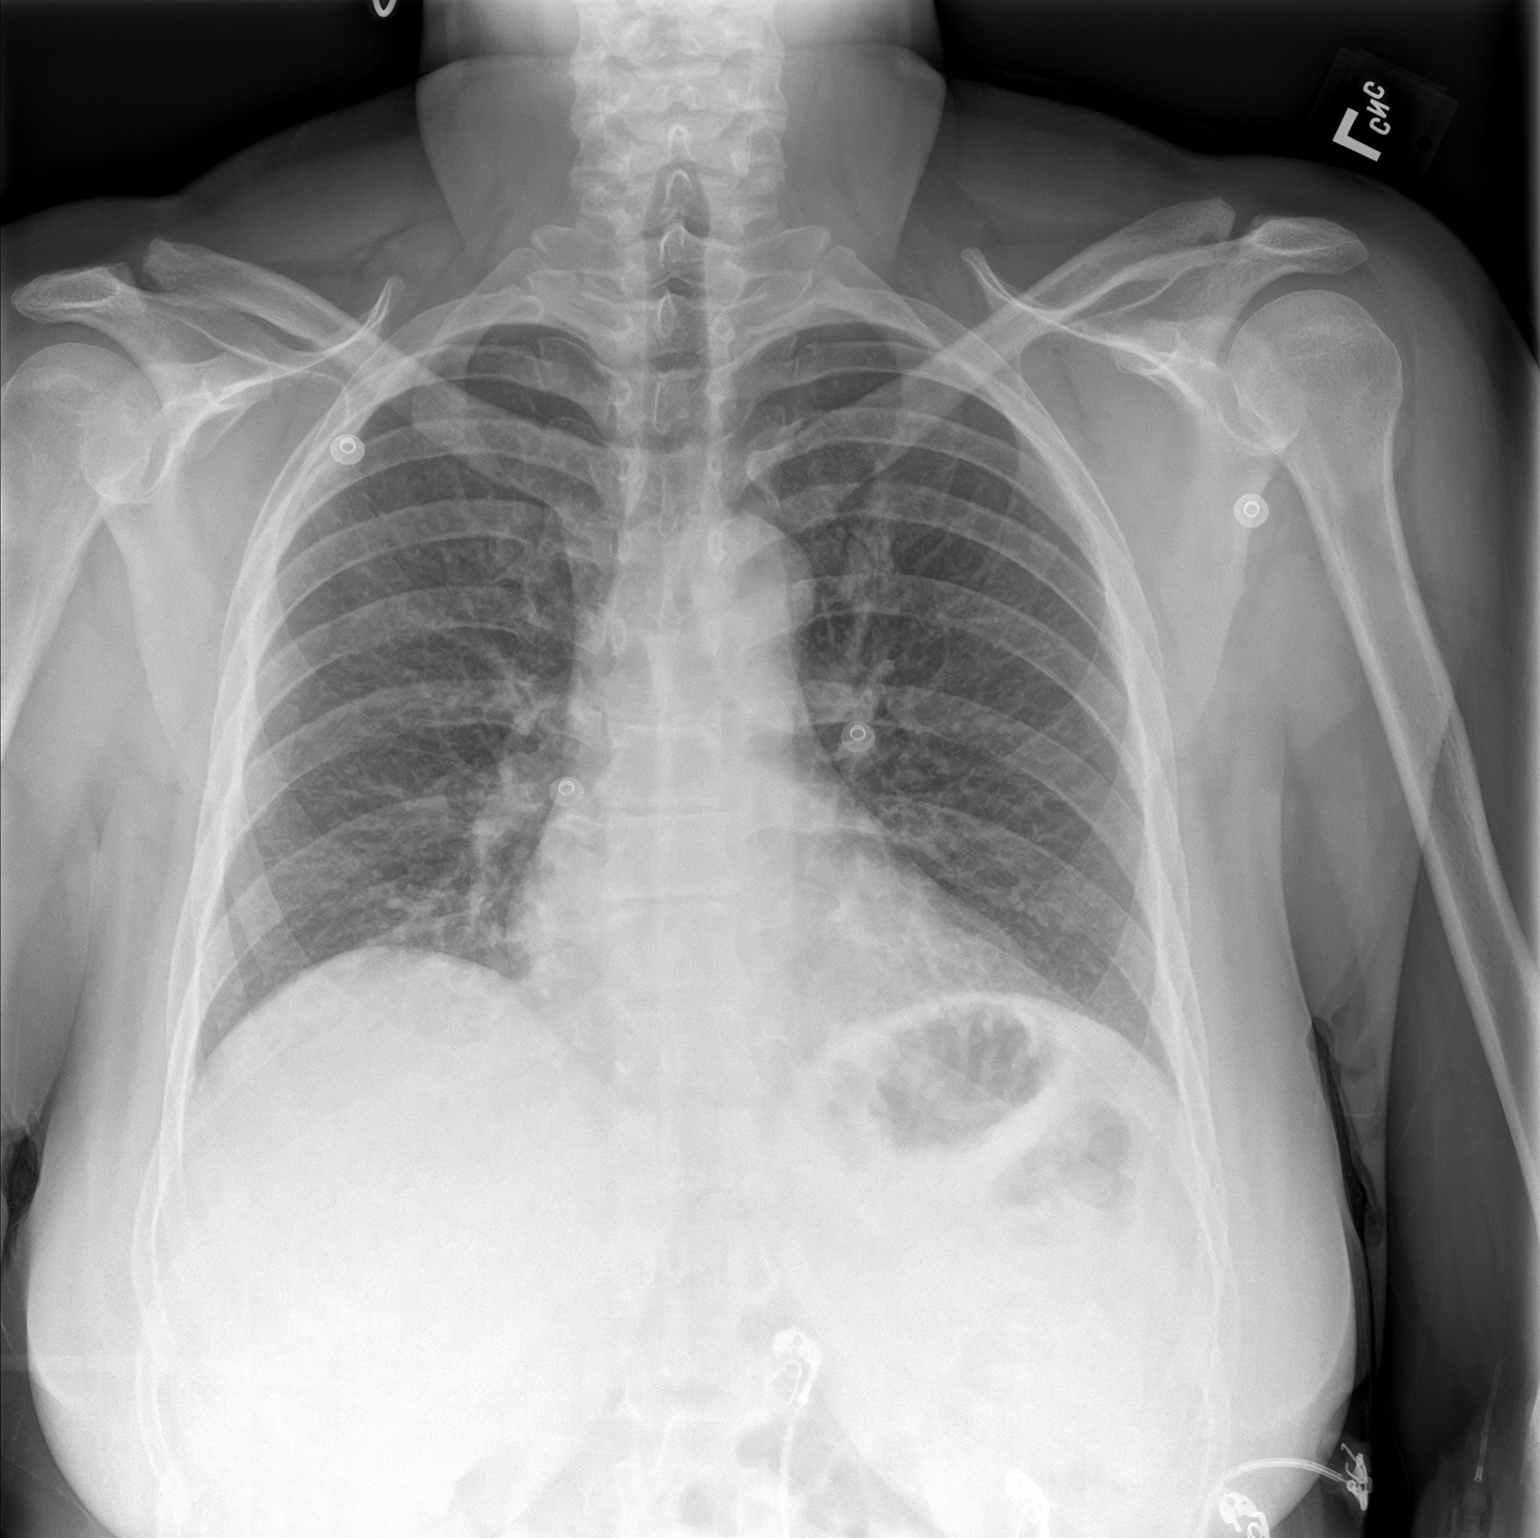
[im 2/2]
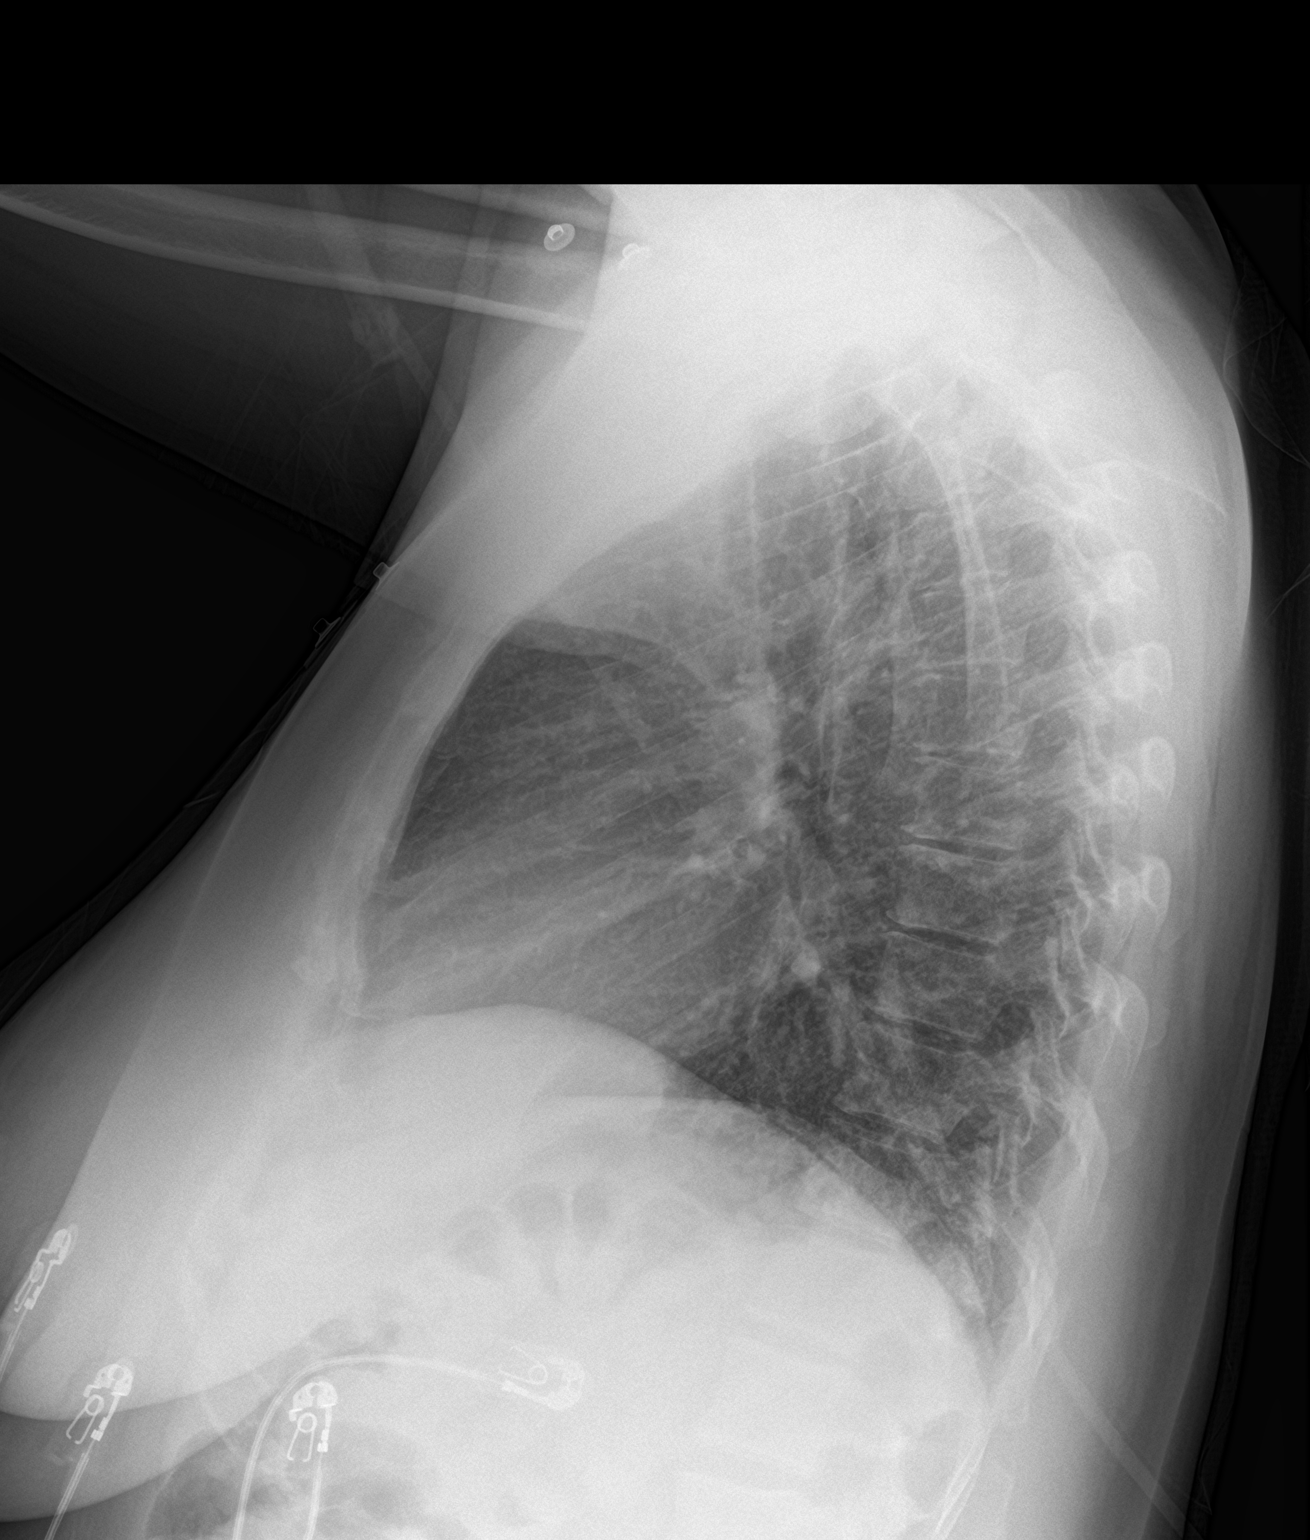

[2 of 2 positions shown; findings below may reference images not displayed]

FINDINGS: Stable lung volumes. Mediastinal contours remain normal. Visualized
tracheal air column is within normal limits. Mild chronic increased
pulmonary interstitial markings have not significantly changed. No
pneumothorax, pulmonary edema, pleural effusion or confluent
pulmonary opacity.

No acute osseous abnormality identified. Negative visible bowel gas
pattern.
IMPRESSION: No acute cardiopulmonary abnormality.

## 2022-03-13 IMAGING — MG MM DIGITAL SCREENING BILAT W/ TOMO AND CAD
8 series · 8 of 24 positions shown · non-contrast
Comparison: Previous exam(s).

CLINICAL DATA: Screening.

EXAM:
DIGITAL SCREENING BILATERAL MAMMOGRAM WITH TOMOSYNTHESIS AND CAD
TECHNIQUE: Bilateral screening digital craniocaudal and mediolateral oblique
mammograms were obtained. Bilateral screening digital breast
tomosynthesis was performed. The images were evaluated with
computer-aided detection.

[L CC synth-2D]
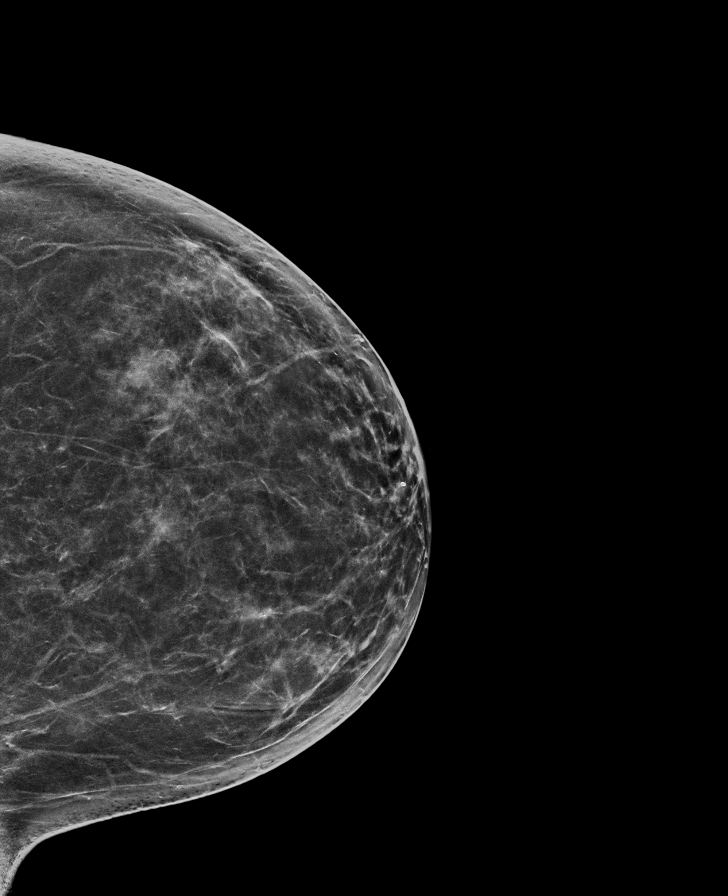

[L MLO synth-2D]
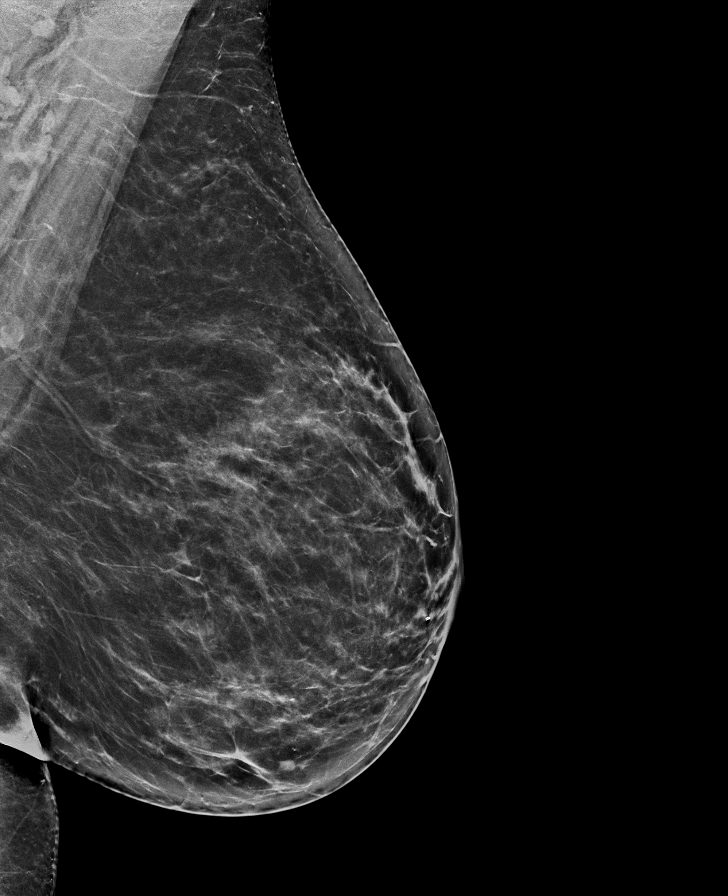

[R CC synth-2D]
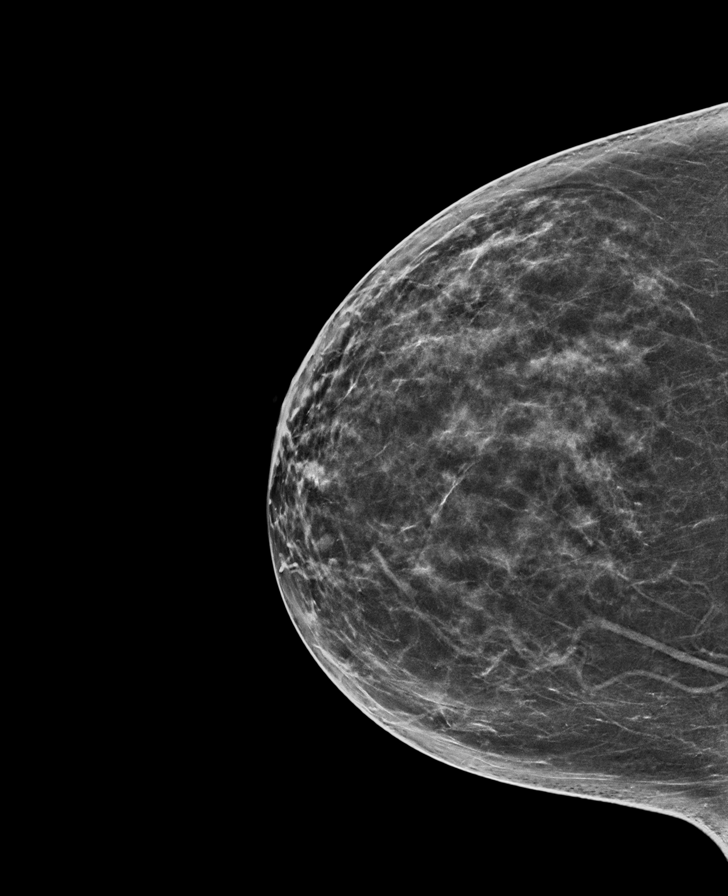

[R MLO synth-2D]
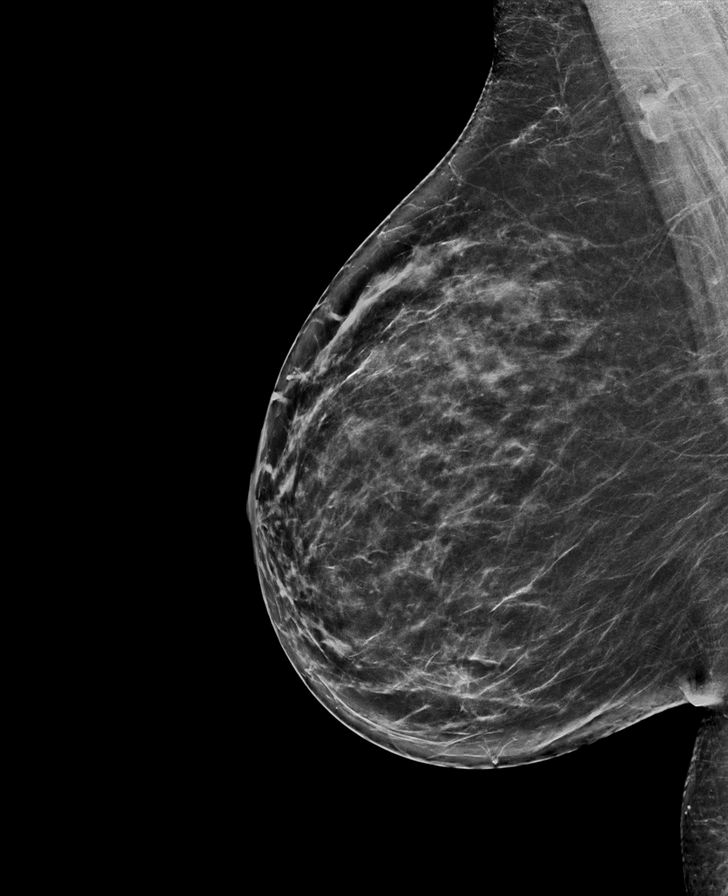

[R MLO tomo · tomo slice 39/78.0]
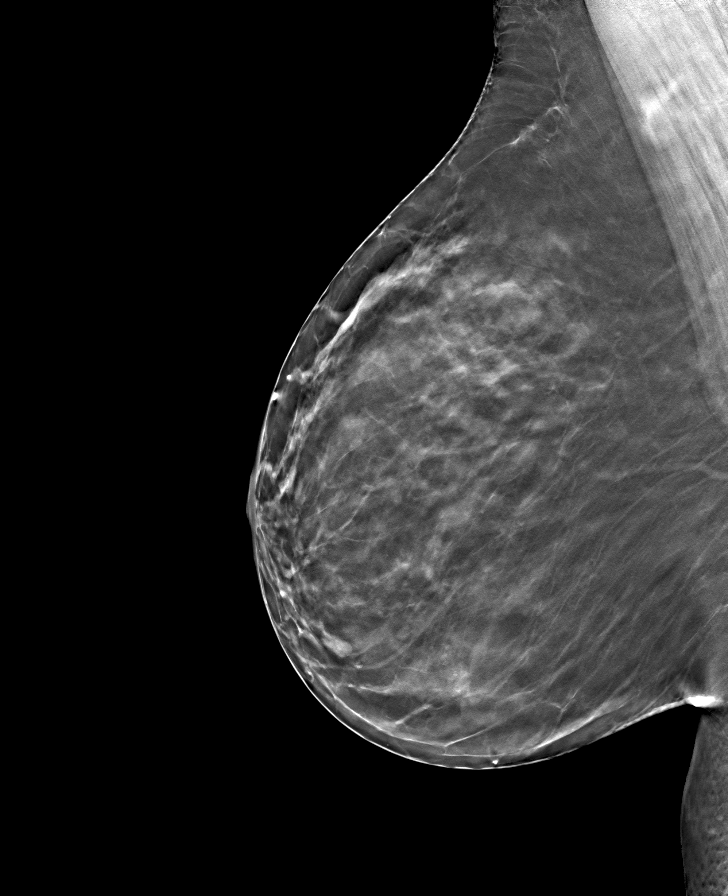

[L CC tomo · tomo slice 39/76.0]
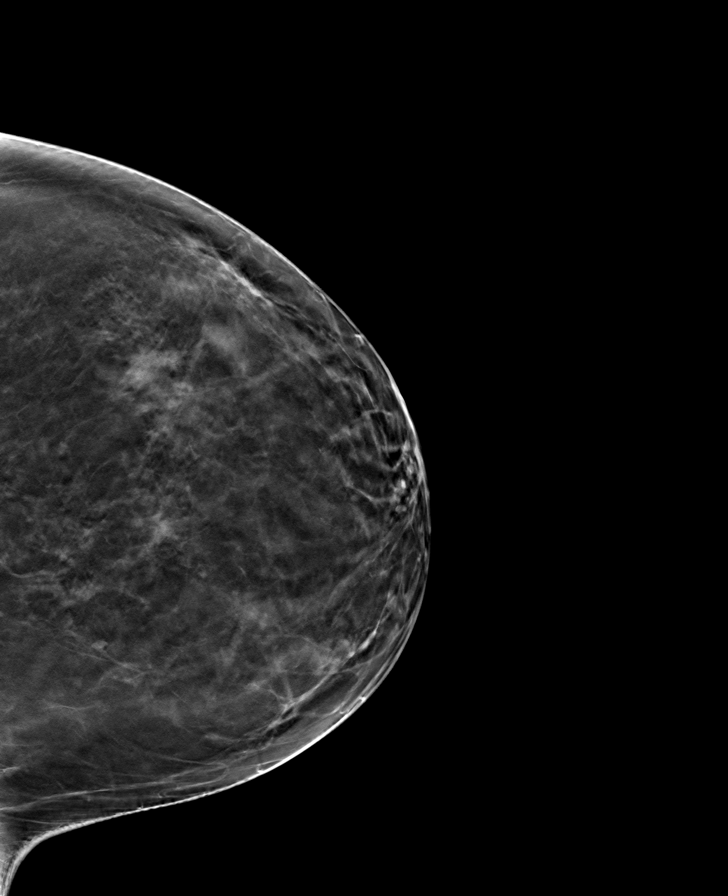

[R CC tomo · tomo slice 37/73.0]
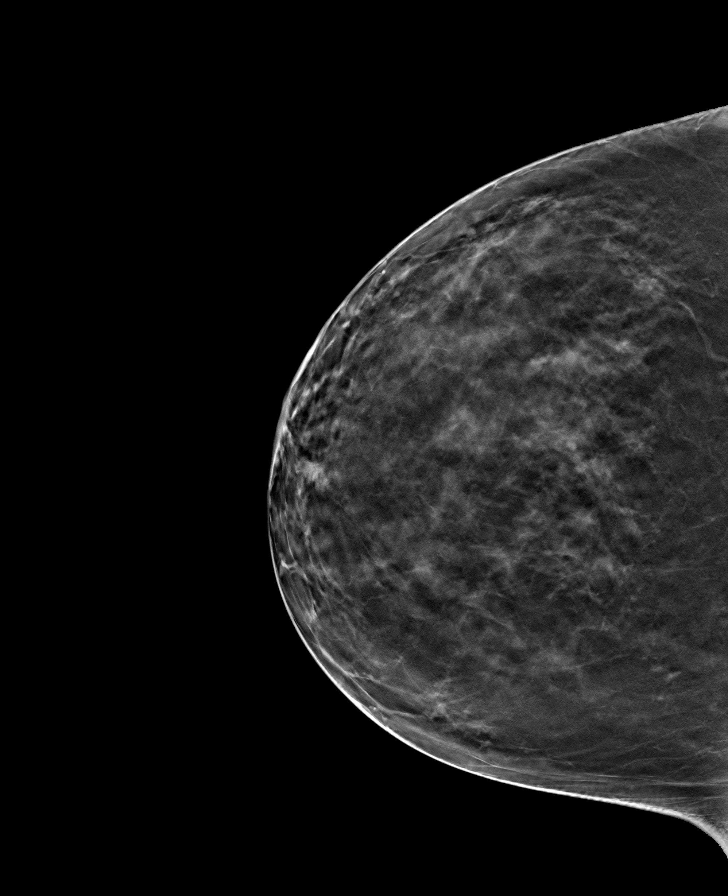

[L MLO tomo · tomo slice 41/80.0]
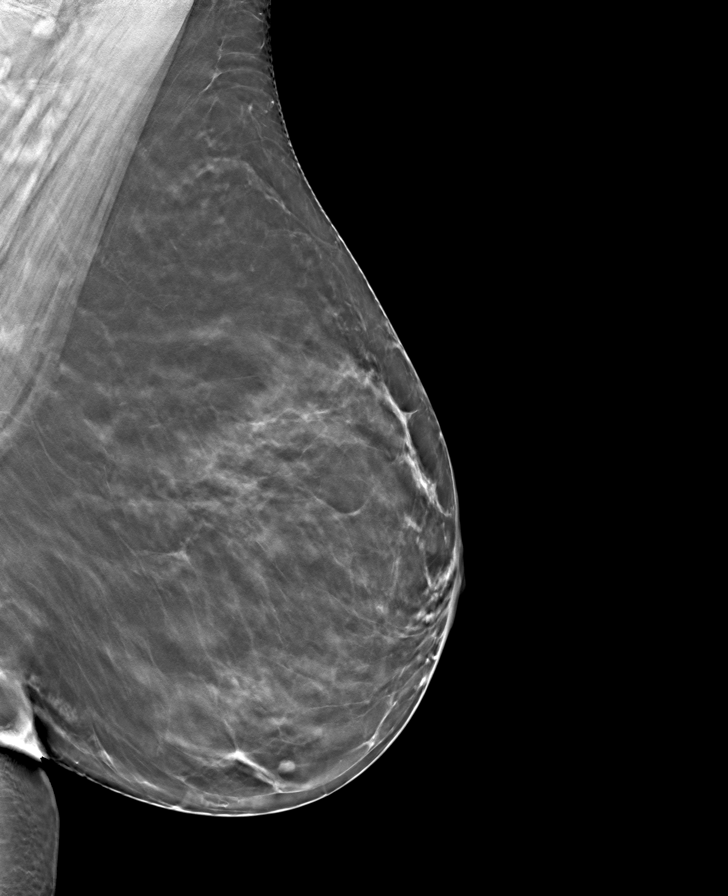

[8 of 24 positions shown; findings below may reference images not displayed]

ACR Breast Density Category c: The breast tissue is heterogeneously
dense, which may obscure small masses.
FINDINGS: There are no findings suspicious for malignancy.
IMPRESSION: No mammographic evidence of malignancy. A result letter of this
screening mammogram will be mailed directly to the patient.

RECOMMENDATION:
Screening mammogram in one year. (Code:Q3-W-BC3)

BI-RADS CATEGORY  1: Negative.

## 2022-09-21 ENCOUNTER — Encounter: Payer: Self-pay | Admitting: Emergency Medicine

## 2022-09-21 ENCOUNTER — Ambulatory Visit
Admission: EM | Admit: 2022-09-21 | Discharge: 2022-09-21 | Disposition: A | Discharge status: Home / Self Care | Payer: 59 | Attending: Family Medicine | Admitting: Family Medicine

## 2022-09-21 DIAGNOSIS — R3129 Other microscopic hematuria: Secondary | ICD-10-CM | POA: Diagnosis present

## 2022-09-21 DIAGNOSIS — R109 Unspecified abdominal pain: Secondary | ICD-10-CM | POA: Insufficient documentation

## 2022-09-21 LAB — URINALYSIS, W/ REFLEX TO CULTURE (INFECTION SUSPECTED)
Glucose, UA: NEGATIVE mg/dL
Leukocytes,Ua: NEGATIVE
Nitrite: NEGATIVE
Protein, ur: NEGATIVE mg/dL
Specific Gravity, Urine: >1.03 — ABNORMAL HIGH (ref 1.005–1.030)
pH: 6 (ref 5.0–8.0)

## 2022-09-21 MED ORDER — CYCLOBENZAPRINE HCL 10 MG PO TABS: 10.0000 mg | ORAL_TABLET | Freq: Two times a day (BID) | ORAL | 0 refills | Status: DC | PRN

## 2022-09-21 MED ORDER — ACETAMINOPHEN 325 MG PO TABS
975.0000 mg | ORAL_TABLET | Freq: Once | ORAL | Status: AC
  Administered 2022-09-21: 975 mg via ORAL

## 2022-09-21 NOTE — ED Triage Notes (Signed)
Pt c/o left side mid back pain onset yesterday. She states she was cleaning her bathtub and over extended. Pt has tried heat and ice and tylenol without relief.

## 2022-09-21 NOTE — Discharge Instructions (Addendum)
You have blood in your urine suspicious for a kidney stone. You were advised to go to the emergency department for further management however you would like to trial muscle relaxers to see if they will help for potential muscle related pain.   You have been advised to follow up immediately in the emergency department for concerning signs or symptoms as discussed during your visit. If you declined EMS transport, please have a family member take you directly to the ED at this time. Do not delay.   Based on concerns about condition, if you do not follow up in the ED, you may risk poor outcomes including worsening of condition, delayed treatment and potentially life threatening issues. If you have declined to go to the ED at this time, you should call your PCP immediately to set up a follow up appointment.   Go to ED for red flag symptoms, including; fevers you cannot reduce with Tylenol/Motrin, severe headaches, vision changes, numbness/weakness in part of the body, lethargy, confusion, intractable vomiting, severe dehydration, chest pain, breathing difficulty, severe persistent abdominal or pelvic pain, signs of severe infection (increased redness, swelling of an area), feeling faint or passing out, dizziness, etc. You should especially go to the ED for sudden acute worsening of condition if you do not elect to go at this time.

## 2022-09-21 NOTE — ED Provider Notes (Signed)
[facility]    CSN: [id number] Arrival date & time: 09/21/22  1407      History   Chief Complaint Chief Complaint  Patient presents with   Back Pain    HPI  HPI Gabrielle Dominguez is a 47 y.o. female.   Mandyp presents for low back pain that started yesterday after cleaning the shower yesterday afternoon. She has a new spinning cleaning brush. She used heat, ice, Tylenol and Motrin but that hasn't helped.     days ago after ***?injury. Pain is described as *** {back pain desc:31852} and {back pain radiation:11559}. Pain rated ***/10.  Endorses {ed back pain sx:10963}. Denies {ed back pain sx:10963}.   Continues to have *** description pain with movement. Remmi ***does not feel like his*** legs are weak.   Has ***never injured his/her*** back before.   Has tried {home care:60200} with {relief:12621}.  ***No change in pain day vs night.    Red flags*** Perianal numbness, cancer, unexplained weight loss, immunosuppression, prolonged use of steroids, history of IV drug use, pain that is increased or unrelieved by rest, fever, bladder or bowel incontinence, significant trauma related to age   Fever : no  Weight loss: *** Perianal numbness: *** Bowel incontinence: *** Bladder incontinence: *** Trauma: ***  Sore throat: no   Cough: no Hydration: normal  Abdominal pain: no Nausea: no Vomiting: no Abdominal pain: *** Dysuria:*** Sleep disturbance: no *** Neck Pain: no Headache: no     Past Medical History:  Diagnosis Date   Abnormal drug screen 02/2015   inapprop pos oxycodone (02/2015)   Abnormal Pap smear    Abscess, peritonsillar 10/28/2020   Bipolar 2 disorder ([place])    History of chicken pox    History of endometriosis    History of pneumonia 07/2010, 04/2011   Hypothyroidism    IBS (irritable bowel syndrome)    Panic disorder    Worsening headaches     Patient Active Problem List   Diagnosis Date Noted   Abnormal smell 12/13/2020   Health  maintenance examination 08/27/2019   Anxiety state 11/13/2018   Adjustment disorder with depressed mood 03/24/2017   Bipolar 2 disorder ([place]) 04/15/2014   Lymphedema of left lower extremity 03/29/2013   Obesity, Class I, BMI 30.0-34.9 (see actual BMI) 08/20/2012   Panic disorder 10/10/2011   Hypothyroidism    History of endometriosis 03/26/2011   HEMATURIA UNSPECIFIED 02/04/2010    Past Surgical History:  Procedure Laterality Date   ABDOMINAL HYSTERECTOMY     BILATERAL SALPINGECTOMY N/A 05/10/2014   Procedure: BILATERAL SALPINGECTOMY;  Surgeon: Donnamae Jude, MD;  Location: [facility];  Service: Gynecology;  Laterality: N/A;   CESAREAN SECTION  2000   COLONOSCOPY  10/2012   normal, mult random biopsies obtained, normal [place])   LAPAROSCOPIC ASSISTED VAGINAL HYSTERECTOMY N/A 05/10/2014   Procedure: LAPAROSCOPIC ASSISTED VAGINAL HYSTERECTOMY;  Surgeon: Donnamae Jude, MD;  Location: [facility];  Service: Gynecology;  Laterality: N/A;   LAPAROSCOPIC ENDOMETRIOSIS FULGURATION  2002 and 2009    OB History     Gravida  2   Para  1   Term  1   Preterm      AB  1   Living  1      SAB  1   IAB      Ectopic      Multiple      Live Births  1            Home  Medications    Prior to Admission medications   Medication Sig Start Date End Date Taking? Authorizing Provider  FLUoxetine (PROZAC) 10 MG tablet Take 10 mg by mouth daily.    [provider]  levothyroxine (EUTHYROX) 88 MCG tablet Take 1 tablet (88 mcg total) by mouth daily before breakfast. 10/09/20   Ria Bush, MD    Family History Family History  Problem Relation Age of Onset   Heart attack Paternal Grandfather    Hypertension Paternal Grandfather    Hypertension Maternal Grandmother    Hypertension Father    Melanoma Father    Cancer Mother        vulvar   Hypertension Mother    Stroke Mother    Irritable bowel syndrome Mother    Depression Mother    Colon cancer Maternal Aunt 25    Hypertension Brother    Stroke Maternal Grandfather    Melanoma Cousin    Diabetes Neg Hx    Breast cancer Neg Hx     Social History Social History   Tobacco Use   Smoking status: Former    Types: Cigarettes    Quit date: 07/15/2002    Years since quitting: 20.2   Smokeless tobacco: Never  Vaping Use   Vaping Use: Never used  Substance Use Topics   Alcohol use: Yes    Alcohol/week: 0.0 standard drinks of alcohol    Comment: rarely- social   Drug use: No     Allergies   Penicillins   Review of Systems Review of Systems: egative unless otherwise stated in HPI.      Physical Exam Triage Vital Signs ED Triage Vitals  Enc Vitals Group     BP 09/21/22 1424 128/84     Pulse Rate 09/21/22 1424 75     Resp --      Temp 09/21/22 1424 98.1 F (36.7 C)     Temp Source 09/21/22 1424 Oral     SpO2 09/21/22 1424 97 %     Weight --      Height --      Head Circumference --      Peak Flow --      Pain Score 09/21/22 1422 10     Pain Loc --      Pain Edu? --      Excl. in [place]? --    No data found.  Updated Vital Signs BP 128/84 (BP Location: Left Arm)   Pulse 75   Temp 98.1 F (36.7 C) (Oral)   LMP 09/12/2011   SpO2 97%   Visual Acuity Right Eye Distance:   Left Eye Distance:   Bilateral Distance:    Right Eye Near:   Left Eye Near:    Bilateral Near:     Physical Exam GEN: well appearing female in no acute distress  CVS: well perfused  RESP: speaking in full sentences without pause, no respiratory distress  MSK:  Lumbar spine: - Inspection: no gross deformity or asymmetry, swelling or ecchymosis. No skin changes  - Palpation: No TTP over the spinous processes, ***bilateral lumbar paraspinal muscles, no SI joint tenderness bilaterally - ROM: full active ROM of the lumbar spine in flexion and extension but with mild pain ***with flexion/extension  - Strength: 5/5 strength of lower extremity in L4-S1 nerve root distributions b/l - Neuro: sensation intact  in the L4-S1 nerve root distribution b/l, ***2+ L4 and S1 reflexes - Special testing: Negative straight leg raise SKIN: warm, dry, no overly  skin rash or erythema    UC Treatments / Results  Labs (all labs ordered are listed, but only abnormal results are displayed) Labs Reviewed - No data to display  EKG   Radiology No results found.  Procedures Procedures (including critical care time)  Medications Ordered in UC Medications - No data to display  Initial Impression / Assessment and Plan / UC Course  I have reviewed the triage vital signs and the nursing notes.  Pertinent labs & imaging results that were available during my care of the patient were reviewed by me and considered in my medical decision making (see chart for details).      Pt is a 47 y.o.  female with *** days of *** back pain after ***.  Obtained *** plain films.  Exam is concerning for a ***.   Xray personally reviewed by me were ***unremarkable for fracture, ***malalignment or dislocation.  Radiologist agrees***/notes ***  Patient to gradually return to normal activities, as tolerated and continue ordinary activities within the limits permitted by pain. Prescribed Naproxen sodium *** and muscle relaxer *** for pain relief.  Advised patient to avoid other NSAIDs while taking ***Naprosyn. Tylenol and Lidocaine patches PRN for multimodal pain relief. Counseled patient on red flag symptoms and when to seek immediate care.  ***No red flags suggesting cauda equina syndrome or progressive major motor weakness. Patient to follow up with orthopedic provider if symptoms do not improve with conservative treatment.  Return and ED precautions given.    Discussed MDM, treatment plan and plan for follow-up with patient/parent who agrees with plan.   Final Clinical Impressions(s) / UC Diagnoses   Final diagnoses:  None   Discharge Instructions   None    ED Prescriptions   None    PDMP not reviewed this encounter.

## 2023-02-06 DIAGNOSIS — F3342 Major depressive disorder, recurrent, in full remission: Secondary | ICD-10-CM | POA: Insufficient documentation

## 2023-02-06 DIAGNOSIS — F515 Nightmare disorder: Secondary | ICD-10-CM | POA: Insufficient documentation

## 2023-02-06 DIAGNOSIS — F331 Major depressive disorder, recurrent, moderate: Secondary | ICD-10-CM | POA: Insufficient documentation

## 2023-02-06 DIAGNOSIS — Z1339 Encounter for screening examination for other mental health and behavioral disorders: Secondary | ICD-10-CM | POA: Insufficient documentation

## 2023-02-06 DIAGNOSIS — F1021 Alcohol dependence, in remission: Secondary | ICD-10-CM | POA: Insufficient documentation

## 2023-02-24 ENCOUNTER — Encounter (HOSPITAL_COMMUNITY): Payer: Self-pay

## 2023-02-24 ENCOUNTER — Other Ambulatory Visit (HOSPITAL_COMMUNITY): Payer: Self-pay

## 2023-02-24 MED ORDER — TIRZEPATIDE-WEIGHT MANAGEMENT 2.5 MG/0.5ML ~~LOC~~ SOAJ
2.5000 mg | SUBCUTANEOUS | 0 refills | Status: DC
  Filled 2023-02-24: qty 2, 28d supply, fill #0
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
## 2023-08-08 ENCOUNTER — Ambulatory Visit
Admission: EM | Admit: 2023-08-08 | Discharge: 2023-08-08 | Disposition: A | Discharge status: Home / Self Care | Payer: Medicaid Other | Attending: Family Medicine | Admitting: Family Medicine

## 2023-08-08 ENCOUNTER — Ambulatory Visit (INDEPENDENT_AMBULATORY_CARE_PROVIDER_SITE_OTHER): Payer: Medicaid Other

## 2023-08-08 DIAGNOSIS — M25561 Pain in right knee: Secondary | ICD-10-CM

## 2023-08-08 MED ORDER — NAPROXEN 500 MG PO TABS
500.0000 mg | ORAL_TABLET | Freq: Two times a day (BID) | ORAL | 0 refills | Status: DC
Start: 2023-08-08 — End: 2024-05-14

## 2023-08-08 MED ORDER — ACETAMINOPHEN 325 MG PO TABS
975.0000 mg | ORAL_TABLET | Freq: Once | ORAL | Status: AC
  Administered 2023-08-08: 975 mg via ORAL

## 2023-08-08 MED ORDER — CYCLOBENZAPRINE HCL 10 MG PO TABS: 10.0000 mg | ORAL_TABLET | Freq: Two times a day (BID) | ORAL | 0 refills | Status: DC | PRN

## 2023-08-08 NOTE — ED Triage Notes (Signed)
Pt is with her friend  Pt c/o right knee pain x1day  Pt states that she was leaving for lunch yesterday and tried to get into her jeep. She had to raise her leg up high and felt a pop in the back of the right knee.  Pt states that she can not turn her knee or bend her knee.

## 2023-08-08 NOTE — Discharge Instructions (Addendum)
If medication was prescribed, stop by the pharmacy to pick up your prescriptions.  For your  pain, Take 1500 mg Tylenol twice a day, take muscle relaxer (Flexeril) twice a day, take Naprosyn twice a day,  as needed for pain. Apply a compressive knee brace. Rest and elevate the affected painful area.  Apply cold compresses intermittently, as needed.  As pain recedes, begin normal activities slowly as tolerated.  Follow up with primary care provider or an orthopedic provider, if symptoms persist.  Watch for worsening symptoms such as an increasing weakness or loss of sensation, increasing pain and/or the loss of bladder or bowel function. Should any of these occur, go to the emergency department immediately.

## 2023-08-08 NOTE — ED Provider Notes (Signed)
[facility]    CSN: [id number] Arrival date & time: 08/08/23  1010      History   Chief Complaint Chief Complaint  Patient presents with   Knee Pain         HPI  HPI Gabrielle Dominguez is a 48 y.o. female.   Gabrielle Dominguez presents for right knee pain.  She felt something pop in the back of her right knee that occurred yesterday evening. She was trying to get into a Jeep.  She had to sling her leg up into the jeep to get into it. She felt a pop.  As the evening progressed the pain got worse. She has been taking ibuprofen. Last dose of ibuprofen was 630 AM.  She was not able to get good sleep last night due to pain.  She iced it and elevated her leg. Turning from side to side gives her throbbing pain.  She has knee swelling.   No previous knee injury.      Past Medical History:  Diagnosis Date   Abnormal drug screen 02/2015   inapprop pos oxycodone (02/2015)   Abnormal Pap smear    Abscess, peritonsillar 10/28/2020   Bipolar 2 disorder ([facility])    History of chicken pox    History of endometriosis    History of pneumonia 07/2010, 04/2011   Hypothyroidism    IBS (irritable bowel syndrome)    Panic disorder    Worsening headaches     Patient Active Problem List   Diagnosis Date Noted   Abnormal smell 12/13/2020   Health maintenance examination 08/27/2019   Anxiety state 11/13/2018   Adjustment disorder with depressed mood 03/24/2017   Bipolar 2 disorder ([facility]) 04/15/2014   Lymphedema of left lower extremity 03/29/2013   Obesity, Class I, BMI 30.0-34.9 (see actual BMI) 08/20/2012   Panic disorder 10/10/2011   Hypothyroidism    History of endometriosis 03/26/2011   HEMATURIA UNSPECIFIED 02/04/2010    Past Surgical History:  Procedure Laterality Date   ABDOMINAL HYSTERECTOMY     BILATERAL SALPINGECTOMY N/A 05/10/2014   Procedure: BILATERAL SALPINGECTOMY;  Surgeon: Reva Bores, MD;  Location: [facility];  Service: Gynecology;  Laterality: N/A;   CESAREAN SECTION  2000    COLONOSCOPY  10/2012   normal, mult random biopsies obtained, normal Russella Dar)   LAPAROSCOPIC ASSISTED VAGINAL HYSTERECTOMY N/A 05/10/2014   Procedure: LAPAROSCOPIC ASSISTED VAGINAL HYSTERECTOMY;  Surgeon: Reva Bores, MD;  Location: [facility];  Service: Gynecology;  Laterality: N/A;   LAPAROSCOPIC ENDOMETRIOSIS FULGURATION  2002 and 2009    OB History     Gravida  2   Para  1   Term  1   Preterm      AB  1   Living  1      SAB  1   IAB      Ectopic      Multiple      Live Births  1            Home Medications    Prior to Admission medications   Medication Sig Start Date End Date Taking? Authorizing Provider  FLUoxetine (PROZAC) 10 MG tablet Take 10 mg by mouth daily.   Yes [provider]  levothyroxine (EUTHYROX) 88 MCG tablet Take 1 tablet (88 mcg total) by mouth daily before breakfast. 10/09/20  Yes Eustaquio Boyden, MD  naproxen (NAPROSYN) 500 MG tablet Take 1 tablet (500 mg total) by mouth 2 (two) times daily with a meal. 08/08/23  Yes Lovene Maret, DO  tirzepatide (ZEPBOUND) 2.5 MG/0.5ML Pen Inject 2.5 mg into the skin once a week. 02/21/23  Yes   cyclobenzaprine (FLEXERIL) 10 MG tablet Take 1 tablet (10 mg total) by mouth 2 (two) times daily as needed for muscle spasms. 08/08/23   Katha Cabal, DO    Family History Family History  Problem Relation Age of Onset   Heart attack Paternal Grandfather    Hypertension Paternal Grandfather    Hypertension Maternal Grandmother    Hypertension Father    Melanoma Father    Cancer Mother        vulvar   Hypertension Mother    Stroke Mother    Irritable bowel syndrome Mother    Depression Mother    Colon cancer Maternal Aunt 40   Hypertension Brother    Stroke Maternal Grandfather    Melanoma Cousin    Diabetes Neg Hx    Breast cancer Neg Hx     Social History Social History   Tobacco Use   Smoking status: Former    Current packs/day: 0.00    Types: Cigarettes    Quit date: 07/15/2002     Years since quitting: 21.0   Smokeless tobacco: Never  Vaping Use   Vaping status: Never Used  Substance Use Topics   Alcohol use: Yes    Alcohol/week: 0.0 standard drinks of alcohol    Comment: rarely- social   Drug use: No     Allergies   Penicillins   Review of Systems Review of Systems: :negative unless otherwise stated in HPI.      Physical Exam Triage Vital Signs ED Triage Vitals  Encounter Vitals Group     BP 08/08/23 1041 132/80     Systolic BP Percentile --      Diastolic BP Percentile --      Pulse Rate 08/08/23 1041 73     Resp --      Temp 08/08/23 1041 98.2 F (36.8 C)     Temp Source 08/08/23 1041 Oral     SpO2 08/08/23 1041 95 %     Weight 08/08/23 1040 215 lb (97.5 kg)     Height 08/08/23 1040 5\' 4"  (1.626 m)     Head Circumference --      Peak Flow --      Pain Score 08/08/23 1039 8     Pain Loc --      Pain Education --      Exclude from Growth Chart --    No data found.  Updated Vital Signs BP 132/80 (BP Location: Left Arm)   Pulse 73   Temp 98.2 F (36.8 C) (Oral)   Ht 5\' 4"  (1.626 m)   Wt 97.5 kg   LMP 09/12/2011   SpO2 95%   BMI 36.90 kg/m   Visual Acuity Right Eye Distance:   Left Eye Distance:   Bilateral Distance:    Right Eye Near:   Left Eye Near:    Bilateral Near:     Physical Exam GEN: well appearing female in no acute distress  CVS: well perfused  RESP: speaking in full sentences without pause, no respiratory distress  MSK:   Right Knee Exam -Inspection: no deformity, no discoloration -Palpation: medial and lateral joint line tenderness, some superior patellar tenderness, posterior quadricep tenderness and hypertonicity -ROM: Extension: 0 degrees; Flexion: Limited secondary to pain.   -Special Tests: Varus Stress: Negative; Valgus Stress: Negative; anterior: Negative; Posterior drawer: Negative; Thessaly: Not attempted; Patellar  grind: Negative -Limb neurovascularly intact, no instability noted    UC  Treatments / Results  Labs (all labs ordered are listed, but only abnormal results are displayed) Labs Reviewed - No data to display  EKG   Radiology DG Knee Complete 4 Views Right Result Date: 08/08/2023 CLINICAL DATA:  Right knee pain while getting into car. EXAM: RIGHT KNEE - COMPLETE 4+ VIEW COMPARISON:  None Available. FINDINGS: No evidence of fracture, dislocation, or joint effusion. No evidence of arthropathy or other focal bone abnormality. Soft tissues are unremarkable. IMPRESSION: Negative. Electronically Signed   By: Danae Orleans M.D.   On: 08/08/2023 11:56     Procedures Procedures (including critical care time)  Medications Ordered in UC Medications  acetaminophen (TYLENOL) tablet 975 mg (975 mg Oral Given 08/08/23 1204)    Initial Impression / Assessment and Plan / UC Course  I have reviewed the triage vital signs and the nursing notes.  Pertinent labs & imaging results that were available during my care of the patient were reviewed by me and considered in my medical decision making (see chart for details).      Pt is a 48 y.o.  female with 1 day of right knee pain after trying to get into a Jeep last night.   On exam, pt has tenderness at medial and lateral joint line, posterior medial patellar and quadricep tenderness concerning for possible fracture versus MSK injury.   Obtained right knee plain films.  Personally interpreted by me were unremarkable for fracture or dislocation. Radiologist report reviewed and additionally notes  no soft tissue swelling.  Tylenol given for pain as she recently had ibuprofen.  Given right knee brace.  Declined crutches  Patient to gradually return to normal activities, as tolerated and continue ordinary activities within the limits permitted by pain. Prescribed Naproxen sodium  and muscle relaxer  for pain relief.  Tylenol PRN. Advised patient to avoid OTC NSAIDs while taking prescription NSAID.   Patient to follow up with orthopedic  provider, if symptoms do not improve with conservative treatment.  Return and ED precautions given. Understanding voiced. Discussed MDM, treatment plan and plan for follow-up with patient who agrees with plan.   Final Clinical Impressions(s) / UC Diagnoses   Final diagnoses:  Acute pain of right knee     Discharge Instructions      If medication was prescribed, stop by the pharmacy to pick up your prescriptions.  For your  pain, Take 1500 mg Tylenol twice a day, take muscle relaxer (Flexeril) twice a day, take Naprosyn twice a day,  as needed for pain. Apply a compressive knee brace. Rest and elevate the affected painful area.  Apply cold compresses intermittently, as needed.  As pain recedes, begin normal activities slowly as tolerated.  Follow up with primary care provider or an orthopedic provider, if symptoms persist.  Watch for worsening symptoms such as an increasing weakness or loss of sensation, increasing pain and/or the loss of bladder or bowel function. Should any of these occur, go to the emergency department immediately.        ED Prescriptions     Medication Sig Dispense Auth. Provider   cyclobenzaprine (FLEXERIL) 10 MG tablet Take 1 tablet (10 mg total) by mouth 2 (two) times daily as needed for muscle spasms. 20 tablet Taijon Vink, DO   naproxen (NAPROSYN) 500 MG tablet Take 1 tablet (500 mg total) by mouth 2 (two) times daily with a meal. 30 tablet Monica Codd, DO  PDMP not reviewed this encounter.   Katha Cabal, DO 08/08/23 1226

## 2023-09-22 DIAGNOSIS — S83519A Sprain of anterior cruciate ligament of unspecified knee, initial encounter: Secondary | ICD-10-CM | POA: Insufficient documentation

## 2023-11-24 DIAGNOSIS — S83511A Sprain of anterior cruciate ligament of right knee, initial encounter: Secondary | ICD-10-CM | POA: Insufficient documentation

## 2024-02-04 DIAGNOSIS — M24661 Ankylosis, right knee: Secondary | ICD-10-CM | POA: Insufficient documentation

## 2024-02-13 DIAGNOSIS — R600 Localized edema: Secondary | ICD-10-CM | POA: Insufficient documentation

## 2024-02-24 DIAGNOSIS — M25661 Stiffness of right knee, not elsewhere classified: Secondary | ICD-10-CM | POA: Insufficient documentation

## 2024-03-22 DIAGNOSIS — R3129 Other microscopic hematuria: Secondary | ICD-10-CM | POA: Insufficient documentation

## 2024-05-06 ENCOUNTER — Ambulatory Visit (INDEPENDENT_AMBULATORY_CARE_PROVIDER_SITE_OTHER): Admitting: Family

## 2024-05-06 ENCOUNTER — Encounter: Payer: Self-pay | Admitting: Family

## 2024-05-06 VITALS — BP 117/76 | HR 72 | Temp 97.3°F | Ht 64.0 in | Wt 223.0 lb

## 2024-05-06 DIAGNOSIS — Z6838 Body mass index (BMI) 38.0-38.9, adult: Secondary | ICD-10-CM

## 2024-05-06 DIAGNOSIS — E559 Vitamin D deficiency, unspecified: Secondary | ICD-10-CM

## 2024-05-06 DIAGNOSIS — E039 Hypothyroidism, unspecified: Secondary | ICD-10-CM

## 2024-05-06 DIAGNOSIS — E538 Deficiency of other specified B group vitamins: Secondary | ICD-10-CM

## 2024-05-06 DIAGNOSIS — N951 Menopausal and female climacteric states: Secondary | ICD-10-CM

## 2024-05-06 DIAGNOSIS — R41840 Attention and concentration deficit: Secondary | ICD-10-CM

## 2024-05-06 DIAGNOSIS — R5383 Other fatigue: Secondary | ICD-10-CM

## 2024-05-06 DIAGNOSIS — Z0130 Encounter for examination of blood pressure without abnormal findings: Secondary | ICD-10-CM

## 2024-05-06 DIAGNOSIS — E66812 Obesity, class 2: Secondary | ICD-10-CM | POA: Diagnosis not present

## 2024-05-06 DIAGNOSIS — R7303 Prediabetes: Secondary | ICD-10-CM

## 2024-05-06 DIAGNOSIS — E782 Mixed hyperlipidemia: Secondary | ICD-10-CM | POA: Diagnosis not present
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
## 2024-05-06 NOTE — Assessment & Plan Note (Signed)
 Continue current meds.  Will adjust as needed based on results.  The patient is asked to make an attempt to improve diet and exercise patterns to aid in medical management of this problem. Addressed importance of increasing and maintaining water  intake.

## 2024-05-06 NOTE — Assessment & Plan Note (Signed)
 Levels recently checked, but will get an additional check today so we can get a baseline.

## 2024-05-07 LAB — CMP14+EGFR
ALT: 16 IU/L (ref 0–32)
AST: 16 IU/L (ref 0–40)
Albumin: 4 g/dL (ref 3.9–4.9)
Alkaline Phosphatase: 62 IU/L (ref 41–116)
BUN/Creatinine Ratio: 15 (ref 9–23)
BUN: 11 mg/dL (ref 6–24)
Bilirubin Total: 0.3 mg/dL (ref 0.0–1.2)
CO2: 22 mmol/L (ref 20–29)
Calcium: 8.9 mg/dL (ref 8.7–10.2)
Chloride: 103 mmol/L (ref 96–106)
Creatinine, Ser: 0.74 mg/dL (ref 0.57–1.00)
Globulin, Total: 2.2 g/dL (ref 1.5–4.5)
Glucose: 92 mg/dL (ref 70–99)
Potassium: 4.4 mmol/L (ref 3.5–5.2)
Sodium: 138 mmol/L (ref 134–144)
Total Protein: 6.2 g/dL (ref 6.0–8.5)
eGFR: 99 mL/min/1.73 (ref >59)

## 2024-05-07 LAB — LIPID PANEL
Chol/HDL Ratio: 3.3 ratio (ref 0.0–4.4)
Cholesterol, Total: 177 mg/dL (ref 100–199)
HDL: 53 mg/dL (ref >39)
LDL Chol Calc (NIH): 94 mg/dL (ref 0–99)
Triglycerides: 176 mg/dL — ABNORMAL HIGH (ref 0–149)
VLDL Cholesterol Cal: 30 mg/dL (ref 5–40)

## 2024-05-07 LAB — FSH+PROG+E2+SHBG
Estradiol: 249 pg/mL
FSH: 3.2 m[IU]/mL
Progesterone: 8 ng/mL
Sex Hormone Binding: 83.2 nmol/L (ref 24.6–122.0)

## 2024-05-07 LAB — HEMOGLOBIN A1C
Est. average glucose Bld gHb Est-mCnc: 97 mg/dL
Hgb A1c MFr Bld: 5 % (ref 4.8–5.6)

## 2024-05-07 LAB — TESTOSTERONE: Testosterone: 22 ng/dL (ref 4–50)

## 2024-05-07 LAB — IRON,TIBC AND FERRITIN PANEL
Ferritin: 897 ng/mL — ABNORMAL HIGH (ref 15–150)
Iron Saturation: 80 % (ref 15–55)
Iron: 150 ug/dL (ref 27–159)
Total Iron Binding Capacity: 187 ug/dL — ABNORMAL LOW (ref 250–450)
UIBC: 37 ug/dL — ABNORMAL LOW (ref 131–425)

## 2024-05-07 LAB — VITAMIN D 25 HYDROXY (VIT D DEFICIENCY, FRACTURES): Vit D, 25-Hydroxy: 30.5 ng/mL (ref 30.0–100.0)

## 2024-05-07 LAB — VITAMIN B12: Vitamin B-12: 1171 pg/mL (ref 232–1245)

## 2024-05-07 LAB — TSH: TSH: 1.7 u[IU]/mL (ref 0.450–4.500)

## 2024-05-10 ENCOUNTER — Ambulatory Visit: Payer: Self-pay

## 2024-05-14 ENCOUNTER — Ambulatory Visit
Admission: RE | Admit: 2024-05-14 | Discharge: 2024-05-14 | Disposition: A | Discharge status: Home / Self Care | Payer: Self-pay | Source: Ambulatory Visit | Attending: Emergency Medicine | Admitting: Emergency Medicine

## 2024-05-14 VITALS — BP 127/86 | HR 63 | Temp 97.7°F | Resp 16 | Ht 64.0 in | Wt 223.0 lb

## 2024-05-14 DIAGNOSIS — R300 Dysuria: Secondary | ICD-10-CM | POA: Insufficient documentation

## 2024-05-14 LAB — URINALYSIS, W/ REFLEX TO CULTURE (INFECTION SUSPECTED)
Bilirubin Urine: NEGATIVE
Glucose, UA: NEGATIVE mg/dL
Ketones, ur: NEGATIVE mg/dL
Leukocytes,Ua: NEGATIVE
Nitrite: NEGATIVE
Protein, ur: NEGATIVE mg/dL
Specific Gravity, Urine: 1.025 (ref 1.005–1.030)
pH: 6.5 (ref 5.0–8.0)

## 2024-05-14 MED ORDER — PHENAZOPYRIDINE HCL 200 MG PO TABS: 200.0000 mg | ORAL_TABLET | Freq: Three times a day (TID) | ORAL | 0 refills | Status: DC

## 2024-05-14 NOTE — Discharge Instructions (Addendum)
 Your testing today did not show the evidence of a urinary tract infection.  As we discussed, your symptoms could be coming from a potential vaginal infection.  The swab you have collected will be back in the next 1 to 2 days and if you test positive for bacterial vaginosis or yeast we will contact you by phone and give you treatment options.  In the meantime, use the Pyridium every 8 hours as needed for urinary discomfort.  This will turn your urine to very vivid red-orange.

## 2024-05-14 NOTE — ED Triage Notes (Signed)
 Pt c/o dysuria, urinary frequency. Started about a week ago. Denies fever, lower back pain or pelvic pain.

## 2024-05-14 NOTE — ED Provider Notes (Signed)
 [facility]    CSN: [id number] Arrival date & time: 05/14/24  1053      History   Chief Complaint Chief Complaint  Patient presents with   Dysuria    HPI Gabrielle Dominguez is a 48 y.o. female.   HPI  48 year old female with past medical history significant for bipolar 2 disorder, hypothyroidism, lymphedema of left lower extremity, adjustment disorder with depressed mood presents for evaluation of UTI symptoms that started approximately 1 week ago.  She is reporting discomfort with urination in the with urinary urgency and frequency.  She denies any fever, low back pain, abdominal pain, or blood in her urine.  She denies any recent hot tub exposure, holding her urination, or decreased fluid intake.  She did recently engage in sexual intercourse.  She denies any overt vaginal itching or discharge but she reports that it feels funny down there.  Past Medical History:  Diagnosis Date   Abnormal drug screen 02/2015   inapprop pos oxycodone  (02/2015)   Abnormal Pap smear    Abscess, peritonsillar 10/28/2020   Bipolar 2 disorder (HCC)    History of chicken pox    History of endometriosis    History of pneumonia 07/2010, 04/2011   Hypothyroidism    IBS (irritable bowel syndrome)    Panic disorder    Worsening headaches     Patient Active Problem List   Diagnosis Date Noted   Abnormal smell 12/13/2020   Health maintenance examination 08/27/2019   Anxiety state 11/13/2018   Adjustment disorder with depressed mood 03/24/2017   Bipolar 2 disorder (HCC) 04/15/2014   Lymphedema of left lower extremity 03/29/2013   Obesity, Class I, BMI 30.0-34.9 (see actual BMI) 08/20/2012   Panic disorder 10/10/2011   Hypothyroidism    History of endometriosis 03/26/2011   HEMATURIA UNSPECIFIED 02/04/2010    Past Surgical History:  Procedure Laterality Date   ABDOMINAL HYSTERECTOMY     BILATERAL SALPINGECTOMY N/A 05/10/2014   Procedure: BILATERAL SALPINGECTOMY;  Surgeon: Glenys GORMAN Birk, MD;  Location: [facility];  Service: Gynecology;  Laterality: N/A;   CESAREAN SECTION  2000   COLONOSCOPY  10/2012   normal, mult random biopsies obtained, normal Oma)   KNEE ARTHROSCOPY WITH ANTERIOR CRUCIATE LIGAMENT (ACL) REPAIR     LAPAROSCOPIC ASSISTED VAGINAL HYSTERECTOMY N/A 05/10/2014   Procedure: LAPAROSCOPIC ASSISTED VAGINAL HYSTERECTOMY;  Surgeon: Glenys GORMAN Birk, MD;  Location: [facility];  Service: Gynecology;  Laterality: N/A;   LAPAROSCOPIC ENDOMETRIOSIS FULGURATION  2002 and 2009    OB History     Gravida  2   Para  1   Term  1   Preterm      AB  1   Living  1      SAB  1   IAB      Ectopic      Multiple      Live Births  1            Home Medications    Prior to Admission medications   Medication Sig Start Date End Date Taking? Authorizing Provider  levothyroxine  (EUTHYROX ) 88 MCG tablet Take 1 tablet (88 mcg total) by mouth daily before breakfast. 10/09/20  Yes Rilla Baller, MD  phenazopyridine (PYRIDIUM) 200 MG tablet Take 1 tablet (200 mg total) by mouth 3 (three) times daily. 05/14/24  Yes Bernardino Ditch, NP    Family History Family History  Problem Relation Age of Onset   Heart attack Paternal Grandfather  Hypertension Paternal Grandfather    Hypertension Maternal Grandmother    Hypertension Father    Melanoma Father    Cancer Mother        vulvar   Hypertension Mother    Stroke Mother    Irritable bowel syndrome Mother    Depression Mother    Colon cancer Maternal Aunt 40   Hypertension Brother    Stroke Maternal Grandfather    Melanoma Cousin    Diabetes Neg Hx    Breast cancer Neg Hx     Social History Social History   Tobacco Use   Smoking status: Former    Current packs/day: 0.00    Types: Cigarettes    Quit date: 07/15/2002    Years since quitting: 21.8   Smokeless tobacco: Never  Vaping Use   Vaping status: Never Used  Substance Use Topics   Alcohol use: Yes    Alcohol/week: 0.0 standard drinks of  alcohol    Comment: rarely- social   Drug use: No     Allergies   Penicillins   Review of Systems Review of Systems  Constitutional:  Negative for fever.  Gastrointestinal:  Negative for abdominal pain.  Genitourinary:  Positive for dysuria, frequency and urgency. Negative for hematuria, vaginal discharge and vaginal pain.  Musculoskeletal:  Negative for back pain.     Physical Exam Triage Vital Signs ED Triage Vitals  Encounter Vitals Group     BP      Girls Systolic BP Percentile      Girls Diastolic BP Percentile      Boys Systolic BP Percentile      Boys Diastolic BP Percentile      Pulse      Resp      Temp      Temp src      SpO2      Weight      Height      Head Circumference      Peak Flow      Pain Score      Pain Loc      Pain Education      Exclude from Growth Chart    No data found.  Updated Vital Signs BP 127/86 (BP Location: Right Arm)   Pulse 63   Temp 97.7 F (36.5 C) (Oral)   Resp 16   Ht 5' 4 (1.626 m)   Wt 222 lb 15.9 oz (101.1 kg)   LMP 09/12/2011   SpO2 97%   BMI 38.28 kg/m   Visual Acuity Right Eye Distance:   Left Eye Distance:   Bilateral Distance:    Right Eye Near:   Left Eye Near:    Bilateral Near:     Physical Exam Vitals and nursing note reviewed.  Constitutional:      Appearance: Normal appearance. She is not ill-appearing.  HENT:     Head: Normocephalic and atraumatic.  Cardiovascular:     Rate and Rhythm: Normal rate and regular rhythm.     Pulses: Normal pulses.     Heart sounds: Normal heart sounds. No murmur heard.    No friction rub. No gallop.  Pulmonary:     Effort: Pulmonary effort is normal.     Breath sounds: Normal breath sounds. No wheezing, rhonchi or rales.  Abdominal:     Tenderness: There is no right CVA tenderness or left CVA tenderness.  Skin:    General: Skin is warm and dry.     Capillary Refill: Capillary  refill takes less than 2 seconds.     Findings: No rash.  Neurological:      General: No focal deficit present.     Mental Status: She is alert and oriented to person, place, and time.      UC Treatments / Results  Labs (all labs ordered are listed, but only abnormal results are displayed) Labs Reviewed  URINALYSIS, W/ REFLEX TO CULTURE (INFECTION SUSPECTED) - Abnormal; Notable for the following components:      Result Value   Hgb urine dipstick SMALL (*)    Bacteria, UA RARE (*)    All other components within normal limits  CERVICOVAGINAL ANCILLARY ONLY    EKG   Radiology No results found.  Procedures Procedures (including critical care time)  Medications Ordered in UC Medications - No data to display  Initial Impression / Assessment and Plan / UC Course  I have reviewed the triage vital signs and the nursing notes.  Pertinent labs & imaging results that were available during my care of the patient were reviewed by me and considered in my medical decision making (see chart for details).   Patient is a pleasant, nontoxic-appearing 48 year old female presenting for evaluation of 1 week with UTI symptoms outlined in HPI above.  In exam room she had any acute distress and she has no CVA tenderness on exam.  She is endorsing dysuria, urgency, and frequency but no hematuria.  I will order a urinalysis to assess the presence of UTI.  Urinalysis shows small hemoglobin but is negative for leukocyte esterase, nitrates, protein, or glucose.  Reflex microscopy shows skin cell contamination with 6-10 squamous epithelials, 0-5 WBCs, 6-10 RBCs, and rare bacteria.    I will order a vaginal wet prep to assess for the presence of possible BV or yeast which could be causing the patient's symptoms.  I will then discharge her home with a diagnosis of dysuria and a prescription for Pyridium to help with her symptoms pending the result of her cytology swab.   Final Clinical Impressions(s) / UC Diagnoses   Final diagnoses:  Dysuria     Discharge Instructions       Your testing today did not show the evidence of a urinary tract infection.  As we discussed, your symptoms could be coming from a potential vaginal infection.  The swab you have collected will be back in the next 1 to 2 days and if you test positive for bacterial vaginosis or yeast we will contact you by phone and give you treatment options.  In the meantime, use the Pyridium every 8 hours as needed for urinary discomfort.  This will turn your urine to very vivid red-orange.     ED Prescriptions     Medication Sig Dispense Auth. Provider   phenazopyridine (PYRIDIUM) 200 MG tablet Take 1 tablet (200 mg total) by mouth 3 (three) times daily. 6 tablet Bernardino Ditch, NP      PDMP not reviewed this encounter.   Bernardino Ditch, NP 05/14/24 [phone]

## 2024-05-17 ENCOUNTER — Ambulatory Visit
Admission: EM | Admit: 2024-05-17 | Discharge: 2024-05-17 | Disposition: A | Discharge status: Home / Self Care | Attending: Physician Assistant | Admitting: Physician Assistant

## 2024-05-17 DIAGNOSIS — R350 Frequency of micturition: Secondary | ICD-10-CM | POA: Insufficient documentation

## 2024-05-17 DIAGNOSIS — R300 Dysuria: Secondary | ICD-10-CM | POA: Insufficient documentation

## 2024-05-17 DIAGNOSIS — Z113 Encounter for screening for infections with a predominantly sexual mode of transmission: Secondary | ICD-10-CM | POA: Diagnosis present

## 2024-05-17 LAB — POCT URINE DIPSTICK
Glucose, UA: 100 mg/dL — AB
Leukocytes, UA: NEGATIVE
Nitrite, UA: POSITIVE — AB
Protein Ur, POC: 30 mg/dL — AB
Spec Grav, UA: 1.01 (ref 1.010–1.025)
Urobilinogen, UA: 2 U/dL — AB
pH, UA: 5 (ref 5.0–8.0)

## 2024-05-17 LAB — CERVICOVAGINAL ANCILLARY ONLY
Bacterial Vaginitis (gardnerella): NEGATIVE
Candida Glabrata: NEGATIVE
Candida Vaginitis: NEGATIVE
Comment: NEGATIVE
Comment: NEGATIVE
Comment: NEGATIVE

## 2024-05-17 MED ORDER — NITROFURANTOIN MONOHYD MACRO 100 MG PO CAPS: 100.0000 mg | ORAL_CAPSULE | Freq: Two times a day (BID) | ORAL | 0 refills | Status: DC

## 2024-05-17 NOTE — ED Triage Notes (Signed)
 Pt still c/o urinary freq & bladder fullness ongoing x1 wk. Was seen on 10/31 for the same issue. Given pyridium w/minor relief.

## 2024-05-17 NOTE — ED Provider Notes (Signed)
 [facility]    CSN: [id number] Arrival date & time: 05/17/24  1400      History   Chief Complaint Chief Complaint  Patient presents with   Dysuria    HPI Gabrielle Dominguez is a 48 y.o. female presenting for 1.5 week history of urinary frequency, urgency, dysuria, and bladder fullness. Also reports cloudy urine. No fever, abdominal pain, flank pain, hematuria, vaginal discharge/odor/lesions. Seen here a few days ago and had a negative UA so urine was not cultured. Vaginal swab negative for BV and yeast. No STI testing. She has no significant concerns but does admit she has been off an on with her current partner and is open to testing today. Has been taking Pyridium regularly and thinks it helps some.  Patient is postmenopausal and has had hysterectomy.   HPI  Past Medical History:  Diagnosis Date   Abnormal drug screen 02/2015   inapprop pos oxycodone  (02/2015)   Abnormal Pap smear    Abscess, peritonsillar 10/28/2020   Bipolar 2 disorder (HCC)    History of chicken pox    History of endometriosis    History of pneumonia 07/2010, 04/2011   Hypothyroidism    IBS (irritable bowel syndrome)    Panic disorder    Worsening headaches     Patient Active Problem List   Diagnosis Date Noted   Microscopic hematuria 03/22/2024   Stiffness of right knee 02/24/2024   Localized edema 02/13/2024   Fibrosis of right knee joint 02/04/2024   Sprain of anterior cruciate ligament of right knee 11/24/2023   Rupture of anterior cruciate ligament 09/22/2023   Alcohol use disorder, moderate, in sustained remission (HCC) 02/06/2023   Moderate recurrent major depression (HCC) 02/06/2023   Nightmares 02/06/2023   Recurrent major depressive disorder, in full remission 02/06/2023   Screening for posttraumatic stress disorder (PTSD) positive 02/06/2023   HSV infection 02/27/2021   Unusual smell in nose 12/13/2020   Health maintenance examination 08/27/2019   Anxiety state 11/13/2018    Adjustment disorder with depressed mood 03/24/2017   S/P laparoscopic assisted vaginal hysterectomy (LAVH) 05/10/2014   Bipolar II disorder (HCC) 04/15/2014   Lymphedema of left lower extremity 03/29/2013   Lymphedema 03/29/2013   Class 1 obesity 08/20/2012   Class 2 obesity due to excess calories without serious comorbidity with body mass index (BMI) of 38.0 to 38.9 in adult 08/20/2012   Panic attack 10/10/2011   Hypothyroidism    History of endometriosis 03/26/2011   Blood in urine 02/04/2010    Past Surgical History:  Procedure Laterality Date   ABDOMINAL HYSTERECTOMY     BILATERAL SALPINGECTOMY N/A 05/10/2014   Procedure: BILATERAL SALPINGECTOMY;  Surgeon: Glenys GORMAN Birk, MD;  Location: [facility];  Service: Gynecology;  Laterality: N/A;   CESAREAN SECTION  2000   COLONOSCOPY  10/2012   normal, mult random biopsies obtained, normal Oma)   KNEE ARTHROSCOPY WITH ANTERIOR CRUCIATE LIGAMENT (ACL) REPAIR     LAPAROSCOPIC ASSISTED VAGINAL HYSTERECTOMY N/A 05/10/2014   Procedure: LAPAROSCOPIC ASSISTED VAGINAL HYSTERECTOMY;  Surgeon: Glenys GORMAN Birk, MD;  Location: [facility];  Service: Gynecology;  Laterality: N/A;   LAPAROSCOPIC ENDOMETRIOSIS FULGURATION  2002 and 2009    OB History     Gravida  2   Para  1   Term  1   Preterm      AB  1   Living  1      SAB  1   IAB  Ectopic      Multiple      Live Births  1            Home Medications    Prior to Admission medications   Medication Sig Start Date End Date Taking? Authorizing Provider  nitrofurantoin, macrocrystal-monohydrate, (MACROBID) 100 MG capsule Take 1 capsule (100 mg total) by mouth 2 (two) times daily. 05/17/24  Yes Arvis Jolan NOVAK, PA-C  levothyroxine  (EUTHYROX ) 88 MCG tablet Take 1 tablet (88 mcg total) by mouth daily before breakfast. 10/09/20   Rilla Baller, MD  phenazopyridine (PYRIDIUM) 200 MG tablet Take 1 tablet (200 mg total) by mouth 3 (three) times daily. 05/14/24   Bernardino Ditch, NP     Family History Family History  Problem Relation Age of Onset   Heart attack Paternal Grandfather    Hypertension Paternal Grandfather    Hypertension Maternal Grandmother    Hypertension Father    Melanoma Father    Cancer Mother        vulvar   Hypertension Mother    Stroke Mother    Irritable bowel syndrome Mother    Depression Mother    Colon cancer Maternal Aunt 40   Hypertension Brother    Stroke Maternal Grandfather    Melanoma Cousin    Diabetes Neg Hx    Breast cancer Neg Hx     Social History Social History   Tobacco Use   Smoking status: Former    Current packs/day: 0.00    Types: Cigarettes    Quit date: 07/15/2002    Years since quitting: 21.8   Smokeless tobacco: Never  Vaping Use   Vaping status: Never Used  Substance Use Topics   Alcohol use: Yes    Alcohol/week: 0.0 standard drinks of alcohol    Comment: rarely- social   Drug use: No     Allergies   Penicillins   Review of Systems Review of Systems  Constitutional:  Negative for chills, fatigue and fever.  Gastrointestinal:  Negative for abdominal pain, diarrhea, nausea and vomiting.  Genitourinary:  Positive for dysuria, frequency and urgency. Negative for decreased urine volume, flank pain, hematuria, pelvic pain, vaginal bleeding, vaginal discharge and vaginal pain.  Musculoskeletal:  Negative for back pain.  Skin:  Negative for rash.     Physical Exam Triage Vital Signs ED Triage Vitals [05/17/24 1413]  Encounter Vitals Group     BP (!) 149/84     Girls Systolic BP Percentile      Girls Diastolic BP Percentile      Boys Systolic BP Percentile      Boys Diastolic BP Percentile      Pulse Rate 70     Resp 18     Temp 98.5 F (36.9 C)     Temp Source Oral     SpO2 95 %     Weight 222 lb 15.9 oz (101.1 kg)     Height 5' 4 (1.626 m)     Head Circumference      Peak Flow      Pain Score 0     Pain Loc      Pain Education      Exclude from Growth Chart    No data  found.  Updated Vital Signs BP (!) 149/84 (BP Location: Right Arm)   Pulse 70   Temp 98.5 F (36.9 C) (Oral)   Resp 18   Ht 5' 4 (1.626 m)   Wt 222 lb 15.9 oz (101.1  kg)   LMP 09/12/2011   SpO2 95%   BMI 38.28 kg/m   Physical Exam Vitals and nursing note reviewed.  Constitutional:      General: She is not in acute distress.    Appearance: Normal appearance. She is not ill-appearing or toxic-appearing.  HENT:     Head: Normocephalic and atraumatic.  Eyes:     General: No scleral icterus.       Right eye: No discharge.        Left eye: No discharge.     Conjunctiva/sclera: Conjunctivae normal.  Cardiovascular:     Rate and Rhythm: Normal rate and regular rhythm.     Heart sounds: Normal heart sounds.  Pulmonary:     Effort: Pulmonary effort is normal. No respiratory distress.     Breath sounds: Normal breath sounds.  Abdominal:     Palpations: Abdomen is soft.     Tenderness: There is no abdominal tenderness. There is no right CVA tenderness or left CVA tenderness.  Musculoskeletal:     Cervical back: Neck supple.  Skin:    General: Skin is dry.  Neurological:     General: No focal deficit present.     Mental Status: She is alert. Mental status is at baseline.     Motor: No weakness.     Gait: Gait normal.  Psychiatric:        Mood and Affect: Mood normal.        Behavior: Behavior normal.      UC Treatments / Results  Labs (all labs ordered are listed, but only abnormal results are displayed) Labs Reviewed  POCT URINE DIPSTICK - Abnormal; Notable for the following components:      Result Value   Color, UA orange (*)    Glucose, UA =100 (*)    Bilirubin, UA small (*)    Ketones, POC UA trace (5) (*)    Blood, UA trace-lysed (*)    Protein Ur, POC =30 (*)    Urobilinogen, UA 2.0 (*)    Nitrite, UA Positive (*)    All other components within normal limits  URINE CULTURE  CERVICOVAGINAL ANCILLARY ONLY    EKG   Radiology No results  found.  Procedures Procedures (including critical care time)  Medications Ordered in UC Medications - No data to display  Initial Impression / Assessment and Plan / UC Course  I have reviewed the triage vital signs and the nursing notes.  Pertinent labs & imaging results that were available during my care of the patient were reviewed by me and considered in my medical decision making (see chart for details).   48 y/o female presents for 1.5 week history of dysuria, urgency/frequency and bladder fullness. Negative UA and vaginitis testing here 3 days ago. Taking Pyridium.   UA obtained today shows interference from AZO so doubtful it is accurate. Will send for culture. Also STI testing.   Possible UTI. Patient would like to try antibiotics while awaiting results. Sent Macrobid. She understands treatment plan may change based on results. Encouraged increasing rest and fluids. If negative testing and continued symptoms, advised PCP follow up. Patient agreeable.    Final Clinical Impressions(s) / UC Diagnoses   Final diagnoses:  Dysuria  Urinary frequency  Routine screening for STI (sexually transmitted infection)     Discharge Instructions      -Urine test today not accurate. Sending for culture. We can try an antibiotic but we may discontinue it if the culture is  negative -Pending STI testing. Will call if positive.  -If our tests are negative and still having symptoms please follow up with PCP     ED Prescriptions     Medication Sig Dispense Auth. Provider   nitrofurantoin, macrocrystal-monohydrate, (MACROBID) 100 MG capsule Take 1 capsule (100 mg total) by mouth 2 (two) times daily. 10 capsule Arvis Jolan NOVAK, PA-C      PDMP not reviewed this encounter.   Arvis Jolan NOVAK, PA-C 05/17/24 [phone]

## 2024-05-17 NOTE — Discharge Instructions (Signed)
-  Urine test today not accurate. Sending for culture. We can try an antibiotic but we may discontinue it if the culture is negative -Pending STI testing. Will call if positive.  -If our tests are negative and still having symptoms please follow up with PCP

## 2024-05-18 ENCOUNTER — Ambulatory Visit: Payer: Self-pay | Admitting: Physician Assistant

## 2024-05-18 LAB — CERVICOVAGINAL ANCILLARY ONLY
Chlamydia: NEGATIVE
Comment: NEGATIVE
Comment: NORMAL
Neisseria Gonorrhea: NEGATIVE

## 2024-05-20 ENCOUNTER — Encounter: Payer: Self-pay | Admitting: Family

## 2024-05-20 ENCOUNTER — Ambulatory Visit: Admitting: Family

## 2024-05-20 VITALS — BP 130/82 | HR 77 | Ht 64.0 in | Wt 220.4 lb

## 2024-05-20 DIAGNOSIS — N390 Urinary tract infection, site not specified: Secondary | ICD-10-CM | POA: Diagnosis not present

## 2024-05-20 DIAGNOSIS — R799 Abnormal finding of blood chemistry, unspecified: Secondary | ICD-10-CM

## 2024-05-20 DIAGNOSIS — Z0130 Encounter for examination of blood pressure without abnormal findings: Secondary | ICD-10-CM

## 2024-05-20 LAB — POCT URINALYSIS DIPSTICK
Bilirubin, UA: NEGATIVE
Blood, UA: POSITIVE
Glucose, UA: NEGATIVE
Ketones, UA: NEGATIVE
Leukocytes, UA: NEGATIVE
Nitrite, UA: NEGATIVE
Protein, UA: NEGATIVE
Spec Grav, UA: 1.015 (ref 1.010–1.025)
Urobilinogen, UA: 0.2 U/dL
pH, UA: 6 (ref 5.0–8.0)

## 2024-05-20 LAB — URINE CULTURE: Culture: 30000 — AB

## 2024-05-21 ENCOUNTER — Telehealth: Payer: Self-pay | Admitting: Family

## 2024-05-21 LAB — URINALYSIS, ROUTINE W REFLEX MICROSCOPIC
Bilirubin, UA: NEGATIVE
Glucose, UA: NEGATIVE
Ketones, UA: NEGATIVE
Leukocytes,UA: NEGATIVE
Nitrite, UA: NEGATIVE
Protein,UA: NEGATIVE
RBC, UA: NEGATIVE
Specific Gravity, UA: 1.016 (ref 1.005–1.030)
Urobilinogen, Ur: 0.2 mg/dL (ref 0.2–1.0)
pH, UA: 6 (ref 5.0–7.5)

## 2024-05-21 NOTE — Telephone Encounter (Signed)
 Patient left VM requesting her lab results. Please advise.

## 2024-05-22 LAB — URINE CULTURE
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
## 2024-05-24 NOTE — Telephone Encounter (Signed)
 Pt has an appt on 06/01/24

## 2024-05-25 LAB — HEMOCHROMATOSIS DNA-PCR(C282Y,H63D)

## 2024-05-25 LAB — IRON,TIBC AND FERRITIN PANEL
Ferritin: 1497 ng/mL — ABNORMAL HIGH (ref 15–150)
Iron Saturation: 84 % (ref 15–55)
Iron: 177 ug/dL — ABNORMAL HIGH (ref 27–159)
Total Iron Binding Capacity: 210 ug/dL — ABNORMAL LOW (ref 250–450)
UIBC: 33 ug/dL — ABNORMAL LOW (ref 131–425)

## 2024-05-26 ENCOUNTER — Ambulatory Visit: Payer: Self-pay | Admitting: Family

## 2024-05-26 ENCOUNTER — Encounter: Payer: Self-pay | Admitting: Family

## 2024-05-31 ENCOUNTER — Inpatient Hospital Stay

## 2024-05-31 ENCOUNTER — Encounter: Payer: Self-pay | Admitting: Oncology

## 2024-05-31 ENCOUNTER — Inpatient Hospital Stay: Attending: Oncology | Admitting: Oncology

## 2024-05-31 DIAGNOSIS — R5383 Other fatigue: Secondary | ICD-10-CM | POA: Diagnosis not present

## 2024-05-31 NOTE — Progress Notes (Signed)
 Gabrielle Dominguez presents today for phlebotomy per MD orders. Phlebotomy procedure started at 1550 and ended at 1605. 250 mls  removed. Patient tolerated procedure well. IV needle removed intact.

## 2024-05-31 NOTE — Patient Instructions (Signed)
 Therapeutic Phlebotomy Discharge Instructions  - Increase your fluid intake over the next 4 hours  - No smoking for 30 minutes  - Avoid using the affected arm (the one you had the blood drawn from) for heavy lifting or other activities.  - You may resume all normal activities after 30 minutes.  You are to notify the office if you experience:   - Persistent dizziness and/or lightheadedness - Uncontrolled or excessive bleeding at the site.   Therapeutic Phlebotomy, Care After The following information offers guidance on how to care for yourself after your procedure. Your health care provider may also give you more specific instructions. If you have problems or questions, contact your health care provider. What can I expect after the procedure? After therapeutic phlebotomy, it is common to have: Light-headedness or dizziness. You may feel faint. Nausea. Tiredness (fatigue). Follow these instructions at home: Eating and drinking Be sure to eat well-balanced meals for the next 24 hours. Drink enough fluid to keep your urine pale yellow. Avoid drinking alcohol on the day that you had the procedure. Activity  Return to your normal activities as told by your health care provider. Most people can go back to their normal activities right away. Avoid activities that take a lot of effort for about 5 hours after the procedure. Athletes should avoid strenuous exercise for at least 12 hours. Avoid heavy lifting or pulling for about 5 hours after the procedure. Do not lift anything that is heavier than 10 lb (4.5 kg). Change positions slowly for the remainder of the day, like from sitting to standing. This can help prevent light-headedness or fainting. If you feel light-headed, lie down until the feeling goes away. Needle insertion site care  Keep your bandage (dressing) dry. You can remove the bandage after about 5 hours or as told by your health care provider. If you have bleeding from the  needle insertion site, raise (elevate) your arm and press firmly on the site until the bleeding stops. If you have bruising at the site, apply ice to the area. To do this: Put ice in a plastic bag. Place a towel between your skin and the bag. Leave the ice on for 20 minutes, 2-3 times a day for the first 24 hours. Remove the ice if your skin turns bright red so you do not damage the area. If the swelling does not go away after 24 hours, apply a warm, moist cloth (warm compress) to the area for 20 minutes, 2-3 times a day. General instructions Do not use any products that contain nicotine or tobacco, like cigarettes, chewing tobacco, and vaping devices, such as e-cigarettes, for at least 30 minutes after the procedure. If you need help quitting, ask your health care provider. Keep all follow-up visits. You may need to continue having regular blood tests and therapeutic phlebotomy treatments as directed. Contact a health care provider if: You have redness, swelling, or pain at the needle insertion site. Fluid or blood is coming from the needle insertion site. Pus or a bad smell is coming from the needle insertion site. The needle insertion site feels warm to the touch. You feel light-headed, dizzy, or nauseous, and the feeling does not go away. You have new bruising at the needle insertion site. You feel weaker than normal. You have a fever or chills. Get help right away if: You have chest pain. You have trouble breathing. You have severe nausea or vomiting. Summary After the procedure, it is common to have some  light-headedness, dizziness, nausea, or tiredness (fatigue). Be sure to eat well-balanced meals for the next 24 hours. Drink enough fluid to keep your urine pale yellow. Return to your normal activities as told by your health care provider. Keep all follow-up visits. You may need to continue having regular blood tests and therapeutic phlebotomy treatments as directed. This  information is not intended to replace advice given to you by your health care provider. Make sure you discuss any questions you have with your health care provider. Document Revised: 12/17/2022 Document Reviewed: 12/27/2020 Elsevier Patient Education  2024 [employer].
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
## 2024-06-01 ENCOUNTER — Encounter: Payer: Self-pay | Admitting: Family

## 2024-06-01 ENCOUNTER — Ambulatory Visit: Admitting: Family

## 2024-06-01 VITALS — BP 106/78 | HR 73 | Ht 64.0 in | Wt 220.2 lb

## 2024-06-01 DIAGNOSIS — E66812 Obesity, class 2: Secondary | ICD-10-CM

## 2024-06-01 DIAGNOSIS — E6609 Other obesity due to excess calories: Secondary | ICD-10-CM

## 2024-06-01 DIAGNOSIS — F411 Generalized anxiety disorder: Secondary | ICD-10-CM

## 2024-06-01 DIAGNOSIS — Z0130 Encounter for examination of blood pressure without abnormal findings: Secondary | ICD-10-CM

## 2024-06-01 DIAGNOSIS — Z6838 Body mass index (BMI) 38.0-38.9, adult: Secondary | ICD-10-CM

## 2024-06-01 NOTE — Assessment & Plan Note (Signed)
 Reassured patient and provided education today.  Will continue to monitor her status at follow up appointments.
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
## 2024-06-01 NOTE — Assessment & Plan Note (Signed)
 Discussed disease state with patient today, says that she has more understanding of things to avoid.  Patient is seen by hematology, who manage this condition.  She is started on a plan of care with  them.   Will defer to them for further changes to plan of care.

## 2024-06-01 NOTE — Assessment & Plan Note (Signed)
 Continue current meds.  Will adjust as needed based on results.  The patient is asked to make an attempt to improve diet and exercise patterns to aid in medical management of this problem. Addressed importance of increasing and maintaining water  intake.

## 2024-06-04 ENCOUNTER — Other Ambulatory Visit: Payer: Self-pay | Admitting: *Deleted

## 2024-06-07 ENCOUNTER — Inpatient Hospital Stay

## 2024-06-07 LAB — HEMOGLOBIN AND HEMATOCRIT (CANCER CENTER ONLY)
HCT: 39.1 % (ref 36.0–46.0)
Hemoglobin: 14 g/dL (ref 12.0–15.0)

## 2024-06-07 LAB — FERRITIN: Ferritin: 1212 ng/mL — ABNORMAL HIGH (ref 11–307)

## 2024-06-07 NOTE — Patient Instructions (Signed)

## 2024-06-07 NOTE — Progress Notes (Signed)
 Gabrielle Dominguez presents today for phlebotomy per MD orders. Phlebotomy procedure started at 1522 and ended at 1531. 250 mls removed. Patient tolerated procedure well. IV needle removed intact.

## 2024-06-14 ENCOUNTER — Inpatient Hospital Stay

## 2024-06-14 ENCOUNTER — Inpatient Hospital Stay: Attending: Oncology

## 2024-06-14 LAB — FERRITIN: Ferritin: 746 ng/mL — ABNORMAL HIGH (ref 11–307)

## 2024-06-14 LAB — HEMOGLOBIN AND HEMATOCRIT (CANCER CENTER ONLY)
HCT: 38.9 % (ref 36.0–46.0)
Hemoglobin: 13.3 g/dL (ref 12.0–15.0)

## 2024-06-14 NOTE — Patient Instructions (Signed)

## 2024-06-14 NOTE — Progress Notes (Signed)
 Gabrielle Dominguez presents today for phlebotomy per MD orders. Phlebotomy procedure started at 1513 and ended at 1525. 250 mls removed. Patient tolerated procedure well. IV needle removed intact.

## 2024-06-21 ENCOUNTER — Inpatient Hospital Stay

## 2024-06-21 ENCOUNTER — Other Ambulatory Visit: Payer: Self-pay

## 2024-06-21 LAB — HEMOGLOBIN AND HEMATOCRIT (CANCER CENTER ONLY)
HCT: 38.4 % (ref 36.0–46.0)
Hemoglobin: 13.6 g/dL (ref 12.0–15.0)

## 2024-06-21 LAB — FERRITIN: Ferritin: 699 ng/mL — ABNORMAL HIGH (ref 11–307)

## 2024-06-21 NOTE — Progress Notes (Signed)
 Patient requested ferritin to be drawn weekly. Orders obtained from Dr. Melanee. Ok to proceed with phlebotomy. She also has not heard about the MRI date/scheduling. Pt informed that Malia B will be reaching out to her to schedule the MRI.

## 2024-06-28 ENCOUNTER — Inpatient Hospital Stay

## 2024-06-28 LAB — HEMOGLOBIN AND HEMATOCRIT (CANCER CENTER ONLY)
HCT: 38.3 % (ref 36.0–46.0)
Hemoglobin: 13.3 g/dL (ref 12.0–15.0)

## 2024-06-28 LAB — FERRITIN: Ferritin: 583 ng/mL — ABNORMAL HIGH (ref 11–307)

## 2024-06-28 NOTE — Patient Instructions (Signed)

## 2024-06-28 NOTE — Progress Notes (Signed)
 Gabrielle Dominguez presents today for phlebotomy per MD orders. Phlebotomy procedure started at 1503 and ended at 1515. 250 mls removed. Patient tolerated procedure well. IV needle removed intact.

## 2024-07-02 ENCOUNTER — Other Ambulatory Visit: Payer: Self-pay

## 2024-07-05 ENCOUNTER — Inpatient Hospital Stay

## 2024-07-05 LAB — HEMOGLOBIN AND HEMATOCRIT (CANCER CENTER ONLY)
HCT: 40 % (ref 36.0–46.0)
Hemoglobin: 13.7 g/dL (ref 12.0–15.0)

## 2024-07-05 LAB — FERRITIN: Ferritin: 506 ng/mL — ABNORMAL HIGH (ref 11–307)

## 2024-07-05 NOTE — Patient Instructions (Signed)

## 2024-07-05 NOTE — Progress Notes (Signed)
 Gabrielle Dominguez presents today for phlebotomy per MD orders. Phlebotomy procedure started at 1504 and ended at 1519. 250 mls removed. Patient tolerated procedure well. IV needle removed intact.

## 2024-07-07 ENCOUNTER — Other Ambulatory Visit: Payer: Self-pay | Admitting: Family

## 2024-07-12 ENCOUNTER — Inpatient Hospital Stay

## 2024-07-12 LAB — FERRITIN: Ferritin: 369 ng/mL — ABNORMAL HIGH (ref 11–307)

## 2024-07-12 LAB — HEMOGLOBIN AND HEMATOCRIT (CANCER CENTER ONLY)
HCT: 41.2 % (ref 36.0–46.0)
Hemoglobin: 14.3 g/dL (ref 12.0–15.0)

## 2024-07-19 ENCOUNTER — Inpatient Hospital Stay: Attending: Oncology

## 2024-07-19 ENCOUNTER — Inpatient Hospital Stay

## 2024-07-19 LAB — HEMOGLOBIN AND HEMATOCRIT (CANCER CENTER ONLY)
HCT: 38.7 % (ref 36.0–46.0)
Hemoglobin: 13.6 g/dL (ref 12.0–15.0)

## 2024-07-19 LAB — FERRITIN: Ferritin: 306 ng/mL (ref 11–307)

## 2024-07-26 ENCOUNTER — Inpatient Hospital Stay

## 2024-07-26 LAB — FERRITIN: Ferritin: 289 ng/mL (ref 11–307)

## 2024-07-26 LAB — HEMOGLOBIN AND HEMATOCRIT (CANCER CENTER ONLY)
HCT: 38.6 % (ref 36.0–46.0)
Hemoglobin: 13.5 g/dL (ref 12.0–15.0)

## 2024-07-26 MED ORDER — SODIUM CHLORIDE 0.9 % IV SOLN
Freq: Once | INTRAVENOUS | Status: DC
  Filled 2024-07-26: qty 250

## 2024-07-26 MED ORDER — SODIUM CHLORIDE 0.9 % IV SOLN
Freq: Once | INTRAVENOUS | Status: AC
  Filled 2024-07-26: qty 250

## 2024-07-26 NOTE — Patient Instructions (Signed)

## 2024-07-26 NOTE — Progress Notes (Signed)
 Pt reports feeling dizzy and nauseate after last phlebotomy and requested IV fluids today after Phlebotomy. Dr. Melanee notified and gave an order to give 500 mls after phlebotomy today. Gabrielle Dominguez presents today for phlebotomy per MD orders. Phlebotomy procedure started at 1310 and ended at 1320. 250 mls removed. Gave 500 ml Normal Saline bolus over 30 minutes after phlebotomy.  Patient tolerated procedure well. IV needle removed intact.

## 2024-08-02 ENCOUNTER — Inpatient Hospital Stay

## 2024-08-02 ENCOUNTER — Other Ambulatory Visit: Payer: Self-pay | Admitting: Oncology

## 2024-08-02 ENCOUNTER — Ambulatory Visit: Admitting: Family

## 2024-08-02 ENCOUNTER — Encounter: Payer: Self-pay | Admitting: Family

## 2024-08-02 VITALS — BP 118/86 | HR 74 | Ht 64.0 in | Wt 228.6 lb

## 2024-08-02 DIAGNOSIS — R4189 Other symptoms and signs involving cognitive functions and awareness: Secondary | ICD-10-CM

## 2024-08-02 DIAGNOSIS — E66812 Obesity, class 2: Secondary | ICD-10-CM

## 2024-08-02 DIAGNOSIS — Z6839 Body mass index (BMI) 39.0-39.9, adult: Secondary | ICD-10-CM | POA: Diagnosis not present

## 2024-08-02 DIAGNOSIS — Z1231 Encounter for screening mammogram for malignant neoplasm of breast: Secondary | ICD-10-CM

## 2024-08-02 DIAGNOSIS — E6609 Other obesity due to excess calories: Secondary | ICD-10-CM | POA: Diagnosis not present

## 2024-08-02 DIAGNOSIS — R41840 Attention and concentration deficit: Secondary | ICD-10-CM | POA: Diagnosis not present

## 2024-08-02 DIAGNOSIS — Z1211 Encounter for screening for malignant neoplasm of colon: Secondary | ICD-10-CM

## 2024-08-02 DIAGNOSIS — Z0130 Encounter for examination of blood pressure without abnormal findings: Secondary | ICD-10-CM

## 2024-08-02 LAB — HEMOGLOBIN AND HEMATOCRIT (CANCER CENTER ONLY)
HCT: 38.7 % (ref 36.0–46.0)
Hemoglobin: 13.6 g/dL (ref 12.0–15.0)

## 2024-08-02 LAB — FERRITIN: Ferritin: 181 ng/mL (ref 11–307)

## 2024-08-02 MED ORDER — ZEPBOUND 5 MG/0.5ML ~~LOC~~ SOAJ: 5.0000 mg | SUBCUTANEOUS | 2 refills | Status: AC

## 2024-08-02 NOTE — Patient Instructions (Signed)

## 2024-08-02 NOTE — Assessment & Plan Note (Signed)
 Sending RX for Zepbound  for patient.  Will send PA for her when we get this back.

## 2024-08-02 NOTE — Progress Notes (Signed)
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 (Cancer Center Only)     Status: None   Collection Time: 06/21/24  1:37 PM  Result Value Ref Range   Hemoglobin 13.6 12.0 - 15.0 g/dL   HCT 61.5 63.9 - 53.9 %    Comment: Performed at [facility], [address] [place], [place] [address]  Ferritin     Status: Abnormal   Collection Time: 06/21/24  2:11 PM  Result Value Ref Range   Ferritin 699 (H) 11 - 307 ng/mL    Comment: Performed at [facility], [address] [place], [place] [address]  Hemoglobin and Hematocrit (Cancer Center Only)     Status: None   Collection Time: 06/28/24  2:43 PM  Result Value Ref Range    Hemoglobin 13.3 12.0 - 15.0 g/dL   HCT 61.6 63.9 - 53.9 %    Comment: Performed at [facility], [address] [place], [place] [address]  Ferritin     Status: Abnormal   Collection Time: 06/28/24  2:43 PM  Result Value Ref Range   Ferritin 583 (H) 11 - 307  ng/mL    Comment: Performed at [facility], [address] [place], [place] [address]  Hemoglobin and Hematocrit (Cancer Center Only)     Status: None   Collection Time: 07/05/24  2:33 PM  Result Value Ref Range   Hemoglobin 13.7 12.0 - 15.0 g/dL   HCT 59.9 63.9 - 53.9 %    Comment: Performed at [facility], [address], [place], [place] [address]  Ferritin     Status: Abnormal   Collection Time: 07/05/24  2:33 PM  Result Value Ref Range   Ferritin 506 (H) 11 - 307 ng/mL    Comment: Performed at [facility], [address] [place], [place] [address]  Hemoglobin and Hematocrit (Cancer Center Only)     Status: None   Collection Time: 07/12/24  2:14 PM  Result Value Ref Range   Hemoglobin 14.3 12.0 - 15.0 g/dL   HCT 58.7 63.9 - 53.9 %    Comment: Performed at [facility], [address], [place], [place] [address]  Ferritin     Status: Abnormal   Collection Time: 07/12/24  2:14 PM  Result Value Ref Range   Ferritin 369 (H) 11 - 307 ng/mL    Comment: Performed at [facility], [address] [place], [place] [address]  Hemoglobin and Hematocrit (Cancer Center Only)     Status: None   Collection Time: 07/19/24  2:29 PM  Result Value Ref Range   Hemoglobin 13.6 12.0 - 15.0 g/dL   HCT 61.2 63.9 - 53.9 %    Comment: Performed at [facility], [address] [place], [place] [address]  Ferritin     Status: None   Collection Time: 07/19/24  2:29 PM  Result Value Ref Range   Ferritin 306 11 - 307 ng/mL    Comment: Performed at [facility] [address] [place], [place] [address]  Hemoglobin and Hematocrit (Cancer Center Only)     Status: None    Collection Time: 07/26/24  2:43 PM  Result Value Ref Range   Hemoglobin 13.5 12.0 - 15.0 g/dL   HCT 61.3 63.9 - 53.9 %    Comment: Performed at [facility], [address] [place], [place] [address]  Ferritin     Status: None   Collection Time: 07/26/24  2:43 PM  Result Value Ref Range   Ferritin 289 11 - 307 ng/mL    Comment: Performed at [facility], [address], [place], [place] [address]       Assessment & Plan Brain fog Attention and concentration deficit Sending referral to [facility] to evaluate for ADHD vs other causes for her attention and concentration issues.  Will await results of evaluation for further treatment decisions.   Screening mammogram for breast cancer Order for mammogram sent today.  Patient given contact information for [facility] in case she does not hear from them to schedule.   Screening for malignant neoplasm of colon Referral order sent to GI for patient, as she does have a family history of colon cancer.  She had one previously.   Class 2 obesity due to excess calories without serious comorbidity with body mass index (BMI) of 39.0 to 39.9 in adult Sending RX for Zepbound  for patient.  Will send PA for her when we get this back.      Return in about 1 month (around 09/02/2024) for F/U.   Total time spent: 30 minutes  Kathlyne Loud CHRISTELLA ARRANT, FNP  08/02/2024   This document may have been prepared by Dragon Voice Recognition software and as such may include unintentional dictation errors.       [1]  Allergies Allergen Reactions   Penicillins Hives and Swelling    But can take amoxicillin??   "

## 2024-08-02 NOTE — Patient Instructions (Addendum)
 [facility]  [address] [facility] [place],  [place]  [address]  Main: [phone]    [facility] -   [phone]    [facility] -   [address] [place], [place] [address]  P: [phone]

## 2024-08-02 NOTE — Progress Notes (Signed)
 Gabrielle Dominguez presents today for phlebotomy per MD orders. Phlebotomy procedure started at 1533 and ended at 1547. 250 mls removed. Patient tolerated procedure well. IV needle removed intact.

## 2024-08-03 ENCOUNTER — Ambulatory Visit: Payer: Self-pay | Admitting: Oncology

## 2024-08-03 NOTE — Telephone Encounter (Signed)
 Per Dr. Melanee Continue H&H ferritin and weekly phlebotomy X3.  Standing orders already in chart and patient already scheduled for upcoming appointments.

## 2024-08-07 ENCOUNTER — Ambulatory Visit

## 2024-08-09 ENCOUNTER — Ambulatory Visit

## 2024-08-09 ENCOUNTER — Inpatient Hospital Stay

## 2024-08-11 ENCOUNTER — Inpatient Hospital Stay

## 2024-08-11 LAB — HEMOGLOBIN AND HEMATOCRIT (CANCER CENTER ONLY)
HCT: 41.3 % (ref 36.0–46.0)
Hemoglobin: 14.4 g/dL (ref 12.0–15.0)

## 2024-08-11 LAB — FERRITIN: Ferritin: 180 ng/mL (ref 11–307)

## 2024-08-11 MED ORDER — SODIUM CHLORIDE 0.9 % IV SOLN
INTRAVENOUS | Status: AC
  Filled 2024-08-11 (×2): qty 250

## 2024-08-11 NOTE — Progress Notes (Signed)
 Gabrielle Dominguez Sick presents today for phlebotomy per MD orders. Phlebotomy procedure started at 1415 and ended at 1429. 250 mls removed.Normal Saline bolus of 500mls.  Patient tolerated procedure well. IV needle removed intact.

## 2024-08-11 NOTE — Patient Instructions (Signed)

## 2024-08-12 ENCOUNTER — Ambulatory Visit
Admission: RE | Admit: 2024-08-12 | Discharge: 2024-08-12 | Disposition: A | Discharge status: Home / Self Care | Source: Ambulatory Visit | Attending: Oncology | Admitting: Oncology

## 2024-08-12 MED ORDER — GADOBUTROL 1 MMOL/ML IV SOLN
10.0000 mL | Freq: Once | INTRAVENOUS | Status: AC | PRN
  Administered 2024-08-12: 10 mL via INTRAVENOUS

## 2024-08-15 ENCOUNTER — Telehealth: Payer: Self-pay | Admitting: Oncology

## 2024-08-15 NOTE — Telephone Encounter (Signed)
 Clinic closed on 2/2, due to weather, I called and spoke with pt and confirmed new date/time for appts

## 2024-08-16 ENCOUNTER — Inpatient Hospital Stay

## 2024-08-18 ENCOUNTER — Inpatient Hospital Stay

## 2024-08-18 ENCOUNTER — Telehealth: Payer: Self-pay

## 2024-08-18 LAB — FERRITIN: Ferritin: 133 ng/mL (ref 11–307)

## 2024-08-18 LAB — HEMOGLOBIN AND HEMATOCRIT (CANCER CENTER ONLY)
HCT: 39.9 % (ref 36.0–46.0)
Hemoglobin: 14 g/dL (ref 12.0–15.0)

## 2024-08-18 NOTE — Telephone Encounter (Signed)
 [facility] in [place] sent prior authorization for Tirzepatide  on Jan. 19 but they haven't heard back. She needs more ASAP.

## 2024-08-18 NOTE — Patient Instructions (Signed)

## 2024-08-18 NOTE — Progress Notes (Signed)
 Gabrielle Dominguez presents today for phlebotomy per MD orders. Phlebotomy procedure started at 1415 and ended at 1425. 250 mls removed. Patient tolerated procedure well. IV needle removed intact.

## 2024-08-23 ENCOUNTER — Inpatient Hospital Stay

## 2024-08-30 ENCOUNTER — Inpatient Hospital Stay

## 2024-08-31 ENCOUNTER — Ambulatory Visit

## 2024-09-02 ENCOUNTER — Ambulatory Visit: Admitting: Family

## 2024-09-06 ENCOUNTER — Inpatient Hospital Stay

## 2024-09-14 ENCOUNTER — Inpatient Hospital Stay

## 2024-09-14 ENCOUNTER — Inpatient Hospital Stay: Admitting: Oncology
